# Patient Record
Sex: Female | Born: 1990 | Race: Black or African American | Hispanic: No | Smoking: Never smoker
Health system: Southern US, Community
[De-identification: ages and names within clinical notes are randomized; demographics above are authoritative.]

## PROBLEM LIST (undated history)

## (undated) ENCOUNTER — Inpatient Hospital Stay (HOSPITAL_COMMUNITY): Payer: Self-pay

## (undated) ENCOUNTER — Emergency Department (HOSPITAL_COMMUNITY): Admission: EM | Payer: Medicaid Other | Source: Home / Self Care

## (undated) DIAGNOSIS — N390 Urinary tract infection, site not specified: Secondary | ICD-10-CM

## (undated) DIAGNOSIS — O1490 Unspecified pre-eclampsia, unspecified trimester: Secondary | ICD-10-CM

## (undated) DIAGNOSIS — K439 Ventral hernia without obstruction or gangrene: Secondary | ICD-10-CM

## (undated) DIAGNOSIS — O24419 Gestational diabetes mellitus in pregnancy, unspecified control: Secondary | ICD-10-CM

## (undated) DIAGNOSIS — D563 Thalassemia minor: Secondary | ICD-10-CM

## (undated) HISTORY — PX: OTHER SURGICAL HISTORY: SHX169

## (undated) HISTORY — DX: Unspecified pre-eclampsia, unspecified trimester: O14.90

## (undated) HISTORY — DX: Thalassemia minor: D56.3

## (undated) HISTORY — DX: Ventral hernia without obstruction or gangrene: K43.9

## (undated) HISTORY — DX: Urinary tract infection, site not specified: N39.0

---
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
## 2009-07-01 ENCOUNTER — Ambulatory Visit (HOSPITAL_COMMUNITY): Admission: RE | Admit: 2009-07-01 | Discharge: 2009-07-01 | Payer: Self-pay | Admitting: Obstetrics & Gynecology

## 2009-08-02 ENCOUNTER — Ambulatory Visit (HOSPITAL_COMMUNITY): Admission: RE | Admit: 2009-08-02 | Discharge: 2009-08-02 | Payer: Self-pay | Admitting: Obstetrics & Gynecology

## 2009-08-12 ENCOUNTER — Ambulatory Visit (HOSPITAL_COMMUNITY): Admission: RE | Admit: 2009-08-12 | Discharge: 2009-08-12 | Payer: Self-pay | Admitting: Obstetrics & Gynecology

## 2010-01-09 ENCOUNTER — Inpatient Hospital Stay (HOSPITAL_COMMUNITY): Admission: AD | Admit: 2010-01-09 | Discharge: 2010-01-12 | Payer: Self-pay | Admitting: Obstetrics & Gynecology

## 2010-01-10 ENCOUNTER — Encounter: Payer: Self-pay | Admitting: Obstetrics

## 2010-06-22 LAB — URIC ACID
Uric Acid, Serum: 5.9 mg/dL (ref 2.4–7.0)
Uric Acid, Serum: 7.3 mg/dL — ABNORMAL HIGH (ref 2.4–7.0)

## 2010-06-22 LAB — COMPREHENSIVE METABOLIC PANEL
ALT: 20 U/L (ref 0–35)
AST: 29 U/L (ref 0–37)
AST: 39 U/L — ABNORMAL HIGH (ref 0–37)
Albumin: 1.8 g/dL — ABNORMAL LOW (ref 3.5–5.2)
Albumin: 2.1 g/dL — ABNORMAL LOW (ref 3.5–5.2)
BUN: 13 mg/dL (ref 6–23)
CO2: 21 mEq/L (ref 19–32)
Calcium: 8.4 mg/dL (ref 8.4–10.5)
Chloride: 102 mEq/L (ref 96–112)
Creatinine, Ser: 0.69 mg/dL (ref 0.4–1.2)
GFR calc non Af Amer: >60 mL/min (ref >60)
GFR calc non Af Amer: >60 mL/min (ref >60)
Glucose, Bld: 108 mg/dL — ABNORMAL HIGH (ref 70–99)
Glucose, Bld: 137 mg/dL — ABNORMAL HIGH (ref 70–99)
Potassium: 4 mEq/L (ref 3.5–5.1)
Total Bilirubin: 0.2 mg/dL — ABNORMAL LOW (ref 0.3–1.2)
Total Bilirubin: 0.5 mg/dL (ref 0.3–1.2)
Total Protein: 5.2 g/dL — ABNORMAL LOW (ref 6.0–8.3)

## 2010-06-22 LAB — URINALYSIS, ROUTINE W REFLEX MICROSCOPIC
Bilirubin Urine: NEGATIVE
Leukocytes, UA: NEGATIVE
Protein, ur: >300 mg/dL — AB
Specific Gravity, Urine: 1.025 (ref 1.005–1.030)
pH: 7 (ref 5.0–8.0)

## 2010-06-22 LAB — CBC
HCT: 39.2 % (ref 36.0–46.0)
HCT: 40 % (ref 36.0–46.0)
Hemoglobin: 11.5 g/dL — ABNORMAL LOW (ref 12.0–15.0)
Hemoglobin: 13.2 g/dL (ref 12.0–15.0)
MCH: 30.6 pg (ref 26.0–34.0)
MCHC: 33.9 g/dL (ref 30.0–36.0)
MCHC: 34.8 g/dL (ref 30.0–36.0)
RBC: 4.3 MIL/uL (ref 3.87–5.11)
RBC: 4.45 MIL/uL (ref 3.87–5.11)
RDW: 14.1 % (ref 11.5–15.5)
WBC: 12.8 10*3/uL — ABNORMAL HIGH (ref 4.0–10.5)

## 2010-06-22 LAB — LACTATE DEHYDROGENASE
LDH: 248 U/L (ref 94–250)
LDH: 347 U/L — ABNORMAL HIGH (ref 94–250)

## 2010-06-22 LAB — URINE MICROSCOPIC-ADD ON

## 2010-06-22 LAB — MRSA CULTURE

## 2010-06-22 LAB — RPR: RPR Ser Ql: NONREACTIVE

## 2010-06-30 ENCOUNTER — Emergency Department (HOSPITAL_COMMUNITY)
Admission: EM | Admit: 2010-06-30 | Discharge: 2010-06-30 | Disposition: A | Discharge status: Home / Self Care | Payer: Medicaid Other | Attending: Emergency Medicine | Admitting: Emergency Medicine

## 2010-06-30 DIAGNOSIS — R197 Diarrhea, unspecified: Secondary | ICD-10-CM | POA: Insufficient documentation

## 2010-06-30 DIAGNOSIS — R112 Nausea with vomiting, unspecified: Secondary | ICD-10-CM | POA: Insufficient documentation

## 2010-06-30 DIAGNOSIS — R51 Headache: Secondary | ICD-10-CM | POA: Insufficient documentation

## 2010-06-30 DIAGNOSIS — R42 Dizziness and giddiness: Secondary | ICD-10-CM | POA: Insufficient documentation

## 2010-06-30 LAB — URINALYSIS, ROUTINE W REFLEX MICROSCOPIC
Glucose, UA: NEGATIVE mg/dL
Ketones, ur: 15 mg/dL — AB
Nitrite: NEGATIVE
Protein, ur: 100 mg/dL — AB
Urobilinogen, UA: 0.2 mg/dL (ref 0.0–1.0)
pH: 6 (ref 5.0–8.0)

## 2010-06-30 LAB — POCT I-STAT, CHEM 8
Chloride: 105 mEq/L (ref 96–112)
Creatinine, Ser: 0.7 mg/dL (ref 0.4–1.2)
Glucose, Bld: 150 mg/dL — ABNORMAL HIGH (ref 70–99)
Hemoglobin: 13.9 g/dL (ref 12.0–15.0)
Potassium: 3.5 mEq/L (ref 3.5–5.1)
Sodium: 141 mEq/L (ref 135–145)

## 2010-06-30 LAB — URINE MICROSCOPIC-ADD ON

## 2010-06-30 LAB — POCT PREGNANCY, URINE: Preg Test, Ur: NEGATIVE

## 2010-08-27 ENCOUNTER — Emergency Department (HOSPITAL_COMMUNITY)
Admission: EM | Admit: 2010-08-27 | Discharge: 2010-08-27 | Disposition: A | Discharge status: Home / Self Care | Payer: Self-pay | Attending: Emergency Medicine | Admitting: Emergency Medicine

## 2010-08-27 DIAGNOSIS — R509 Fever, unspecified: Secondary | ICD-10-CM | POA: Insufficient documentation

## 2010-08-27 DIAGNOSIS — R51 Headache: Secondary | ICD-10-CM | POA: Insufficient documentation

## 2010-08-27 DIAGNOSIS — J029 Acute pharyngitis, unspecified: Secondary | ICD-10-CM | POA: Insufficient documentation

## 2010-08-27 LAB — RAPID STREP SCREEN (MED CTR MEBANE ONLY): Streptococcus, Group A Screen (Direct): NEGATIVE

## 2011-04-10 NOTE — L&D Delivery Note (Signed)
Delivery Note At 8:05 AM a viable female was delivered via Vaginal, Spontaneous Delivery (Presentation: Left Occiput Anterior).  APGAR: 9-9 , ; weight .   Placenta status: Intact, Spontaneous.  Cord: 3 vessels with the following complications: None.  Cord pH: none  Anesthesia: Epidural  Episiotomy: None Lacerations: None Suture Repair: none Est. Blood Loss (mL): 300  Mom to postpartum.  Baby to nursery-stable.  Geryl Dohn A 02/27/2012, 8:53 AM

## 2011-07-18 ENCOUNTER — Encounter: Payer: Medicaid Other | Admitting: Obstetrics and Gynecology

## 2011-07-18 DIAGNOSIS — Z01419 Encounter for gynecological examination (general) (routine) without abnormal findings: Secondary | ICD-10-CM

## 2011-07-25 ENCOUNTER — Inpatient Hospital Stay (HOSPITAL_COMMUNITY)
Admission: AD | Admit: 2011-07-25 | Discharge: 2011-07-25 | Disposition: A | Discharge status: Home / Self Care | Payer: Medicaid Other | Source: Ambulatory Visit | Attending: Family Medicine | Admitting: Family Medicine

## 2011-07-25 ENCOUNTER — Encounter (HOSPITAL_COMMUNITY): Payer: Self-pay | Admitting: *Deleted

## 2011-07-25 ENCOUNTER — Inpatient Hospital Stay (HOSPITAL_COMMUNITY): Payer: Medicaid Other

## 2011-07-25 DIAGNOSIS — R109 Unspecified abdominal pain: Secondary | ICD-10-CM | POA: Insufficient documentation

## 2011-07-25 DIAGNOSIS — N949 Unspecified condition associated with female genital organs and menstrual cycle: Secondary | ICD-10-CM | POA: Insufficient documentation

## 2011-07-25 DIAGNOSIS — O99891 Other specified diseases and conditions complicating pregnancy: Secondary | ICD-10-CM | POA: Insufficient documentation

## 2011-07-25 DIAGNOSIS — Z3490 Encounter for supervision of normal pregnancy, unspecified, unspecified trimester: Secondary | ICD-10-CM

## 2011-07-25 DIAGNOSIS — Z1389 Encounter for screening for other disorder: Secondary | ICD-10-CM

## 2011-07-25 DIAGNOSIS — O210 Mild hyperemesis gravidarum: Secondary | ICD-10-CM | POA: Insufficient documentation

## 2011-07-25 LAB — URINE MICROSCOPIC-ADD ON

## 2011-07-25 LAB — POCT PREGNANCY, URINE: Preg Test, Ur: POSITIVE — AB

## 2011-07-25 LAB — URINALYSIS, ROUTINE W REFLEX MICROSCOPIC
Glucose, UA: NEGATIVE mg/dL
Ketones, ur: NEGATIVE mg/dL
Leukocytes, UA: NEGATIVE
Nitrite: NEGATIVE
Specific Gravity, Urine: 1.015 (ref 1.005–1.030)
pH: 8 (ref 5.0–8.0)

## 2011-07-25 LAB — WET PREP, GENITAL
Trich, Wet Prep: NONE SEEN
Yeast Wet Prep HPF POC: NONE SEEN

## 2011-07-25 LAB — HCG, QUANTITATIVE, PREGNANCY: hCG, Beta Chain, Quant, S: 114338 m[IU]/mL — ABNORMAL HIGH (ref <5)

## 2011-07-25 MED ORDER — PROMETHAZINE HCL 25 MG PO TABS: 25.0000 mg | ORAL_TABLET | Freq: Four times a day (QID) | ORAL | Status: DC | PRN

## 2011-07-25 NOTE — MAU Note (Signed)
Neg HPT beginning of April, but pt feels like she is pregnant, having morning sickness.  Cramping since Saturday, denies vaginal bleeding.  States cramps are severe & sharp.  Vomitting since end of March.

## 2011-07-25 NOTE — MAU Provider Note (Signed)
History   Pt presents today c/o lower abd pain that has been severe for the past several days. She also c/o vag dc. She denies vag bleeding, fever, or any other sx at this time. She is concerned she may be pregnant.  CSN: [id number]  Arrival date and time: 07/25/11 1141   First Provider Initiated Contact with Patient 07/25/11 1218      Chief Complaint  Patient presents with  . Abdominal Pain   HPI  OB History    Grav Para Term Preterm Abortions TAB SAB Ect Mult Living   2 1 1       1       Past Medical History  Diagnosis Date  . Pregnancy induced hypertension     No past surgical history on file.  History reviewed. No pertinent family history.  History  Substance Use Topics  . Smoking status: Never Smoker   . Smokeless tobacco: Not on file  . Alcohol Use: No    Allergies: Allergies not on file  No prescriptions prior to admission    Review of Systems  Constitutional: Negative for fever and chills.  Eyes: Negative for blurred vision and double vision.  Respiratory: Negative for cough, hemoptysis, sputum production, shortness of breath and wheezing.   Cardiovascular: Negative for chest pain and palpitations.  Gastrointestinal: Positive for nausea and abdominal pain. Negative for vomiting, diarrhea and constipation.  Genitourinary: Negative for dysuria, urgency, frequency and hematuria.  Neurological: Negative for dizziness and headaches.  Psychiatric/Behavioral: Negative for depression and suicidal ideas.   Physical Exam   Blood pressure 113/72, pulse 110, temperature 98.7 F (37.1 C), temperature source Oral, resp. rate 16, height 5\' 4"  (1.626 m), weight 122 lb (55.339 kg), last menstrual period 06/03/2011.  Physical Exam  Nursing note and vitals reviewed. Constitutional: She is oriented to person, place, and time. She appears well-developed and well-nourished. No distress.  HENT:  Head: Normocephalic and atraumatic.  Eyes: EOM are normal. Pupils are  equal, round, and reactive to light.  GI: Soft. She exhibits no distension and no mass. There is no tenderness. There is no rebound and no guarding.  Genitourinary: No bleeding around the vagina. Vaginal discharge found.       Cervix Lg/closed. No adnexal masses.  Neurological: She is alert and oriented to person, place, and time.  Skin: Skin is warm and dry. She is not diaphoretic.  Psychiatric: She has a normal mood and affect. Her behavior is normal. Judgment and thought content normal.    MAU Course  Procedures  Wet prep and GC/Chlamydia cultures done.  Results for orders placed during the hospital encounter of 07/25/11 (from the past 24 hour(s))  URINALYSIS, ROUTINE W REFLEX MICROSCOPIC     Status: Abnormal   Collection Time   07/25/11 11:55 AM      Component Value Range   Color, Urine YELLOW  YELLOW    APPearance CLEAR  CLEAR    Specific Gravity, Urine 1.015  1.005 - 1.030    pH 8.0  5.0 - 8.0    Glucose, UA NEGATIVE  NEGATIVE (mg/dL)   Hgb urine dipstick NEGATIVE  NEGATIVE    Bilirubin Urine NEGATIVE  NEGATIVE    Ketones, ur NEGATIVE  NEGATIVE (mg/dL)   Protein, ur 30 (*) NEGATIVE (mg/dL)   Urobilinogen, UA 1.0  0.0 - 1.0 (mg/dL)   Nitrite NEGATIVE  NEGATIVE    Leukocytes, UA NEGATIVE  NEGATIVE   URINE MICROSCOPIC-ADD ON     Status: Abnormal  Collection Time   07/25/11 11:55 AM      Component Value Range   Squamous Epithelial / LPF FEW (*) RARE    WBC, UA 0-2  <3 (WBC/hpf)   Urine-Other MUCOUS PRESENT    POCT PREGNANCY, URINE     Status: Abnormal   Collection Time   07/25/11 12:00 PM      Component Value Range   Preg Test, Ur POSITIVE (*) NEGATIVE   WET PREP, GENITAL     Status: Abnormal   Collection Time   07/25/11 12:25 PM      Component Value Range   Yeast Wet Prep HPF POC NONE SEEN  NONE SEEN    Trich, Wet Prep NONE SEEN  NONE SEEN    Clue Cells Wet Prep HPF POC FEW (*) NONE SEEN    WBC, Wet Prep HPF POC FEW (*) NONE SEEN   HCG, QUANTITATIVE, PREGNANCY      Status: Abnormal   Collection Time   07/25/11 12:40 PM      Component Value Range   hCG, Beta Chain, Quant, S [id number] (*) <5 (mIU/mL)   US shows single IUP at 8.4wks with an EDC of 03/01/12.  Assessment and Plan  IUP: discussed with pt at length. Discussed diet, activity, risks, and precautions. Will give Rx for phenergan for prn nausea.  Clinton Gallant. Ramla Hase III, DrHSc, MPAS, PA-C  07/25/2011, 12:24 PM

## 2011-07-25 NOTE — MAU Provider Note (Signed)
Chart reviewed and agree with management and plan.  

## 2011-07-26 LAB — GC/CHLAMYDIA PROBE AMP, GENITAL: GC Probe Amp, Genital: NEGATIVE

## 2011-08-27 LAB — OB RESULTS CONSOLE RUBELLA ANTIBODY, IGM: Rubella: IMMUNE

## 2011-08-27 LAB — OB RESULTS CONSOLE HIV ANTIBODY (ROUTINE TESTING): HIV: NONREACTIVE

## 2011-08-27 LAB — OB RESULTS CONSOLE RPR: RPR: NONREACTIVE

## 2011-09-06 ENCOUNTER — Ambulatory Visit (INDEPENDENT_AMBULATORY_CARE_PROVIDER_SITE_OTHER): Payer: Medicaid Other | Admitting: General Surgery

## 2011-09-07 ENCOUNTER — Ambulatory Visit (INDEPENDENT_AMBULATORY_CARE_PROVIDER_SITE_OTHER): Payer: Medicaid Other | Admitting: General Surgery

## 2011-10-05 ENCOUNTER — Encounter (INDEPENDENT_AMBULATORY_CARE_PROVIDER_SITE_OTHER): Payer: Self-pay | Admitting: General Surgery

## 2011-10-08 ENCOUNTER — Ambulatory Visit (INDEPENDENT_AMBULATORY_CARE_PROVIDER_SITE_OTHER): Payer: Medicaid Other | Admitting: General Surgery

## 2011-10-09 ENCOUNTER — Telehealth (INDEPENDENT_AMBULATORY_CARE_PROVIDER_SITE_OTHER): Payer: Self-pay | Admitting: General Surgery

## 2011-10-09 NOTE — Telephone Encounter (Signed)
Called and advised the patient did not show up for the appointment with Dr. Derrell Lolling on 10/08/11. Advised that we also needed the progress notes for this patient. The only information received were the labs and that was not enough information for Dr. Derrell Lolling to work on in order to properly evaluate this patient.

## 2011-10-16 ENCOUNTER — Encounter (INDEPENDENT_AMBULATORY_CARE_PROVIDER_SITE_OTHER): Payer: Self-pay | Admitting: General Surgery

## 2012-02-01 LAB — OB RESULTS CONSOLE GBS: GBS: NEGATIVE

## 2012-02-21 ENCOUNTER — Encounter (HOSPITAL_COMMUNITY): Payer: Self-pay | Admitting: *Deleted

## 2012-02-21 ENCOUNTER — Telehealth (HOSPITAL_COMMUNITY): Payer: Self-pay | Admitting: *Deleted

## 2012-02-21 NOTE — Telephone Encounter (Signed)
Preadmission screen  

## 2012-02-26 ENCOUNTER — Inpatient Hospital Stay (HOSPITAL_COMMUNITY)
Admission: RE | Admit: 2012-02-26 | Discharge: 2012-02-29 | DRG: 775 | Disposition: A | Discharge status: Home / Self Care | Payer: Medicaid Other | Source: Ambulatory Visit | Attending: Obstetrics & Gynecology | Admitting: Obstetrics & Gynecology

## 2012-02-26 ENCOUNTER — Encounter (HOSPITAL_COMMUNITY): Payer: Self-pay

## 2012-02-26 DIAGNOSIS — O99814 Abnormal glucose complicating childbirth: Principal | ICD-10-CM | POA: Diagnosis present

## 2012-02-26 LAB — CBC
MCH: 27.4 pg (ref 26.0–34.0)
MCHC: 33.4 g/dL (ref 30.0–36.0)
MCV: 82 fL (ref 78.0–100.0)
Platelets: 185 10*3/uL (ref 150–400)
RDW: 15 % (ref 11.5–15.5)
WBC: 7.4 10*3/uL (ref 4.0–10.5)

## 2012-02-26 MED ORDER — NALBUPHINE SYRINGE 5 MG/0.5 ML
10.0000 mg | INJECTION | INTRAMUSCULAR | Status: DC | PRN
  Administered 2012-02-26: 10 mg via INTRAVENOUS
  Filled 2012-02-26 (×2): qty 1

## 2012-02-26 MED ORDER — IBUPROFEN 600 MG PO TABS
600.0000 mg | ORAL_TABLET | Freq: Four times a day (QID) | ORAL | Status: DC | PRN
  Filled 2012-02-26: qty 1

## 2012-02-26 MED ORDER — NALBUPHINE SYRINGE 5 MG/0.5 ML
10.0000 mg | INJECTION | Freq: Four times a day (QID) | INTRAMUSCULAR | Status: DC | PRN
  Administered 2012-02-26: 10 mg via INTRAMUSCULAR
  Filled 2012-02-26 (×3): qty 1

## 2012-02-26 MED ORDER — LIDOCAINE HCL (PF) 1 % IJ SOLN
30.0000 mL | INTRAMUSCULAR | Status: DC | PRN
  Filled 2012-02-26 (×2): qty 30

## 2012-02-26 MED ORDER — OXYCODONE-ACETAMINOPHEN 5-325 MG PO TABS: 1.0000 | ORAL_TABLET | ORAL | Status: DC | PRN

## 2012-02-26 MED ORDER — CITRIC ACID-SODIUM CITRATE 334-500 MG/5ML PO SOLN
30.0000 mL | ORAL | Status: DC | PRN
  Administered 2012-02-27: 30 mL via ORAL
  Filled 2012-02-26: qty 15

## 2012-02-26 MED ORDER — TERBUTALINE SULFATE 1 MG/ML IJ SOLN: 0.2500 mg | Freq: Once | INTRAMUSCULAR | Status: AC | PRN

## 2012-02-26 MED ORDER — MISOPROSTOL 25 MCG QUARTER TABLET
25.0000 ug | ORAL_TABLET | ORAL | Status: DC | PRN
  Administered 2012-02-26 (×2): 25 ug via VAGINAL
  Filled 2012-02-26: qty 1
  Filled 2012-02-26 (×2): qty 0.25

## 2012-02-26 MED ORDER — OXYTOCIN 40 UNITS IN LACTATED RINGERS INFUSION - SIMPLE MED
62.5000 mL/h | INTRAVENOUS | Status: DC
  Administered 2012-02-27: 62.5 mL/h via INTRAVENOUS
  Filled 2012-02-26: qty 1000

## 2012-02-26 MED ORDER — LACTATED RINGERS IV SOLN
INTRAVENOUS | Status: DC
  Administered 2012-02-26 – 2012-02-27 (×2): via INTRAVENOUS

## 2012-02-26 MED ORDER — ONDANSETRON HCL 4 MG/2ML IJ SOLN: 4.0000 mg | Freq: Four times a day (QID) | INTRAMUSCULAR | Status: DC | PRN

## 2012-02-26 MED ORDER — ACETAMINOPHEN 325 MG PO TABS: 650.0000 mg | ORAL_TABLET | ORAL | Status: DC | PRN

## 2012-02-26 MED ORDER — OXYTOCIN BOLUS FROM INFUSION: 500.0000 mL | INTRAVENOUS | Status: DC

## 2012-02-26 MED ORDER — FLEET ENEMA 7-19 GM/118ML RE ENEM: 1.0000 | ENEMA | RECTAL | Status: DC | PRN

## 2012-02-26 MED ORDER — NALBUPHINE SYRINGE 5 MG/0.5 ML: 10.0000 mg | INJECTION | Freq: Four times a day (QID) | INTRAMUSCULAR | Status: DC | PRN

## 2012-02-26 MED ORDER — LACTATED RINGERS IV SOLN
500.0000 mL | INTRAVENOUS | Status: DC | PRN
  Administered 2012-02-27: 300 mL via INTRAVENOUS

## 2012-02-26 MED ORDER — PROMETHAZINE HCL 25 MG/ML IJ SOLN
25.0000 mg | Freq: Four times a day (QID) | INTRAMUSCULAR | Status: DC | PRN
  Administered 2012-02-26: 25 mg via INTRAMUSCULAR
  Filled 2012-02-26: qty 1

## 2012-02-26 NOTE — H&P (Signed)
Traci [name] is a 21 y.o. female presenting for IOL. Maternal Medical History:  Reason for admission: 20 yo G2 P1.  EDC 11-25- 13.  Presents for IOL for Gestational Diabetes.  Fetal activity: Perceived fetal activity is normal.   Last perceived fetal movement was within the past hour.    Prenatal complications: Gestational Diabetes.  Prenatal Complications - Diabetes: gestational. Diabetes is managed by oral agent (monotherapy).      OB History    Grav Para Term Preterm Abortions TAB SAB Ect Mult Living   2 1 1       1      Past Medical History  Diagnosis Date  . Pregnancy induced hypertension   . Hernia    History reviewed. No pertinent past surgical history. Family History: family history is not on file. Social History:  reports that she has never smoked. She does not have any smokeless tobacco history on file. She reports that she does not drink alcohol or use illicit drugs.   Prenatal Transfer Tool  Maternal Diabetes: Yes Genetic Screening: Normal Maternal Ultrasounds/Referrals: Normal Fetal Ultrasounds or other Referrals:  None Maternal Substance Abuse:  No Significant Maternal Medications:  Meds include: Other:  See chart. Significant Maternal Lab Results:  Lab values include: Group B Strep negative Other Comments:  None  Review of Systems  All other systems reviewed and are negative.    Dilation: 1.5 Effacement (%): 60 Station: -2;-3 Exam by:: Traci [name] Blood pressure 122/76, pulse 76, temperature 97.5 F (36.4 C), temperature source Oral, resp. rate 20, height 5\' 4"  (1.626 m), weight 174 lb (78.926 kg), last menstrual period 06/03/2011. Maternal Exam:  Uterine Assessment: Contraction strength is mild.  Abdomen: Patient reports no abdominal tenderness. Fetal presentation: vertex  Introitus: Normal vulva. Normal vagina.  Pelvis: adequate for delivery.   Cervix: Cervix evaluated by digital exam.     Physical Exam  Nursing note and vitals  reviewed. Constitutional: She is oriented to person, place, and time. She appears well-developed and well-nourished.  HENT:  Head: Normocephalic and atraumatic.  Eyes: Conjunctivae normal are normal. Pupils are equal, round, and reactive to light.  Neck: Normal range of motion. Neck supple.  Cardiovascular: Normal rate and regular rhythm.   Respiratory: Effort normal.  GI: Soft.  Genitourinary: Vagina normal and uterus normal.  Musculoskeletal: Normal range of motion.  Neurological: She is alert and oriented to person, place, and time.  Skin: Skin is warm and dry.  Psychiatric: She has a normal mood and affect. Her behavior is normal. Judgment and thought content normal.    Prenatal labs: ABO, Rh: B/Positive/-- (05/20 0000) Antibody: Negative (05/20 0000) Rubella: Immune (05/20 0000) RPR: NON REACTIVE (11/19 0900)  HBsAg: Negative (05/20 0000)  HIV: Non-reactive (05/20 0000)  GBS: Negative (10/25 0000)   Assessment/Plan: 39 weeks.  Gestational Diabetes.  2 stage IOL.   Traci [name] A 02/26/2012, 2:56 PM

## 2012-02-27 ENCOUNTER — Encounter (HOSPITAL_COMMUNITY): Payer: Self-pay | Admitting: Anesthesiology

## 2012-02-27 ENCOUNTER — Inpatient Hospital Stay (HOSPITAL_COMMUNITY): Payer: Medicaid Other | Admitting: Anesthesiology

## 2012-02-27 ENCOUNTER — Encounter (HOSPITAL_COMMUNITY): Payer: Self-pay

## 2012-02-27 LAB — GLUCOSE, CAPILLARY
Glucose-Capillary: 127 mg/dL — ABNORMAL HIGH (ref 70–99)
Glucose-Capillary: 132 mg/dL — ABNORMAL HIGH (ref 70–99)

## 2012-02-27 MED ORDER — DIBUCAINE 1 % RE OINT: 1.0000 "application " | TOPICAL_OINTMENT | RECTAL | Status: DC | PRN

## 2012-02-27 MED ORDER — SENNOSIDES-DOCUSATE SODIUM 8.6-50 MG PO TABS
2.0000 | ORAL_TABLET | Freq: Every day | ORAL | Status: DC
  Administered 2012-02-28: 2 via ORAL

## 2012-02-27 MED ORDER — IBUPROFEN 600 MG PO TABS
600.0000 mg | ORAL_TABLET | Freq: Four times a day (QID) | ORAL | Status: DC
  Administered 2012-02-27 – 2012-02-29 (×9): 600 mg via ORAL
  Filled 2012-02-27 (×8): qty 1

## 2012-02-27 MED ORDER — EPHEDRINE 5 MG/ML INJ: 10.0000 mg | INTRAVENOUS | Status: DC | PRN

## 2012-02-27 MED ORDER — BENZOCAINE-MENTHOL 20-0.5 % EX AERO: 1.0000 "application " | INHALATION_SPRAY | CUTANEOUS | Status: DC | PRN

## 2012-02-27 MED ORDER — LACTATED RINGERS IV SOLN
500.0000 mL | Freq: Once | INTRAVENOUS | Status: AC
  Administered 2012-02-27: 500 mL via INTRAVENOUS

## 2012-02-27 MED ORDER — LIDOCAINE HCL (PF) 1 % IJ SOLN
INTRAMUSCULAR | Status: DC | PRN
  Administered 2012-02-27 (×4): 4 mL

## 2012-02-27 MED ORDER — TETANUS-DIPHTH-ACELL PERTUSSIS 5-2.5-18.5 LF-MCG/0.5 IM SUSP: 0.5000 mL | Freq: Once | INTRAMUSCULAR | Status: DC

## 2012-02-27 MED ORDER — ONDANSETRON HCL 4 MG/2ML IJ SOLN: 4.0000 mg | INTRAMUSCULAR | Status: DC | PRN

## 2012-02-27 MED ORDER — WITCH HAZEL-GLYCERIN EX PADS: 1.0000 "application " | MEDICATED_PAD | CUTANEOUS | Status: DC | PRN

## 2012-02-27 MED ORDER — OXYTOCIN 40 UNITS IN LACTATED RINGERS INFUSION - SIMPLE MED: 62.5000 mL/h | INTRAVENOUS | Status: DC | PRN

## 2012-02-27 MED ORDER — ONDANSETRON HCL 4 MG PO TABS
4.0000 mg | ORAL_TABLET | ORAL | Status: DC | PRN
Start: 2012-02-27 — End: 2012-02-29

## 2012-02-27 MED ORDER — SIMETHICONE 80 MG PO CHEW: 80.0000 mg | CHEWABLE_TABLET | ORAL | Status: DC | PRN

## 2012-02-27 MED ORDER — OXYCODONE-ACETAMINOPHEN 5-325 MG PO TABS
1.0000 | ORAL_TABLET | ORAL | Status: DC | PRN
  Administered 2012-02-27 – 2012-02-29 (×5): 1 via ORAL
  Filled 2012-02-27 (×5): qty 1

## 2012-02-27 MED ORDER — EPHEDRINE 5 MG/ML INJ
10.0000 mg | INTRAVENOUS | Status: DC | PRN
  Filled 2012-02-27: qty 4

## 2012-02-27 MED ORDER — FENTANYL 2.5 MCG/ML BUPIVACAINE 1/10 % EPIDURAL INFUSION (WH - ANES)
14.0000 mL/h | INTRAMUSCULAR | Status: DC
  Administered 2012-02-27: 14 mL/h via EPIDURAL
  Filled 2012-02-27: qty 125

## 2012-02-27 MED ORDER — LANOLIN HYDROUS EX OINT: TOPICAL_OINTMENT | CUTANEOUS | Status: DC | PRN

## 2012-02-27 MED ORDER — DIPHENHYDRAMINE HCL 50 MG/ML IJ SOLN: 12.5000 mg | INTRAMUSCULAR | Status: DC | PRN

## 2012-02-27 MED ORDER — PHENYLEPHRINE 40 MCG/ML (10ML) SYRINGE FOR IV PUSH (FOR BLOOD PRESSURE SUPPORT): 80.0000 ug | PREFILLED_SYRINGE | INTRAVENOUS | Status: DC | PRN

## 2012-02-27 MED ORDER — PRENATAL MULTIVITAMIN CH
1.0000 | ORAL_TABLET | Freq: Every day | ORAL | Status: DC
  Administered 2012-02-28 – 2012-02-29 (×2): 1 via ORAL
  Filled 2012-02-27 (×2): qty 1

## 2012-02-27 MED ORDER — ZOLPIDEM TARTRATE 5 MG PO TABS: 5.0000 mg | ORAL_TABLET | Freq: Every evening | ORAL | Status: DC | PRN

## 2012-02-27 MED ORDER — DIPHENHYDRAMINE HCL 25 MG PO CAPS: 25.0000 mg | ORAL_CAPSULE | Freq: Four times a day (QID) | ORAL | Status: DC | PRN

## 2012-02-27 MED ORDER — PHENYLEPHRINE 40 MCG/ML (10ML) SYRINGE FOR IV PUSH (FOR BLOOD PRESSURE SUPPORT)
80.0000 ug | PREFILLED_SYRINGE | INTRAVENOUS | Status: DC | PRN
  Filled 2012-02-27: qty 5

## 2012-02-27 NOTE — Progress Notes (Signed)
Lactation consultant here to assist pt with breastfeeding.

## 2012-02-27 NOTE — Progress Notes (Signed)
Pt in bed, holding infant.  Call light in reach

## 2012-02-27 NOTE — Anesthesia Procedure Notes (Signed)
Epidural Patient location during procedure: OB Start time: 02/27/2012 12:37 AM  Staffing Performed by: anesthesiologist   Preanesthetic Checklist Completed: patient identified, site marked, surgical consent, pre-op evaluation, timeout performed, IV checked, risks and benefits discussed and monitors and equipment checked  Epidural Patient position: sitting Prep: site prepped and draped and DuraPrep Patient monitoring: continuous pulse ox and blood pressure Approach: midline Injection technique: LOR air  Needle:  Needle type: Tuohy  Needle gauge: 17 G Needle length: 9 cm and 9 Needle insertion depth: 5 cm cm Catheter type: closed end flexible Catheter size: 19 Gauge Catheter at skin depth: 10 cm Test dose: negative  Assessment Events: blood not aspirated, injection not painful, no injection resistance, negative IV test and no paresthesia  Additional Notes Discussed risk of headache, infection, bleeding, nerve injury and failed or incomplete block.  Patient voices understanding and wishes to proceed. Reason for block:procedure for pain

## 2012-02-27 NOTE — Progress Notes (Signed)
Notified LC

## 2012-02-27 NOTE — Progress Notes (Signed)
nsy here to assist pt with breastfeeding

## 2012-02-27 NOTE — Progress Notes (Signed)
Traci [name] is [name] 21 y.o. G2P1001 at [redacted]w[redacted]d by LMP admitted for induction of labor due to Diabetes.  Subjective:   Objective: BP 89/42  Pulse 114  Temp 99.8 F (37.7 C) (Axillary)  Resp 18  Ht 5\' 4"  (1.626 m)  Wt 174 lb (78.926 kg)  BMI 29.87 kg/m2  LMP 06/03/2011   Total I/O In: -  Out: 800 [Urine:800]  FHT:  FHR: 150 bpm, variability: minimal ,  accelerations:  Present,  decelerations:  Present variable UC:   regular, every 2-3 minutes SVE:   Dilation: 7 Effacement (%): 80 Station: 0 Exam by:: L.Mears,Rn  Labs: Lab Results  Component Value Date   WBC 7.4 02/26/2012   HGB 12.0 02/26/2012   HCT 35.9* 02/26/2012   MCV 82.0 02/26/2012   PLT 185 02/26/2012    Assessment / Plan: Slow progress.  Continue pitocin.  Labor: Slow active phase. Preeclampsia:  n/[name] Fetal Wellbeing:  Category II Pain Control:  Epidural I/D:  n/[name]   Traci [name] 02/27/2012, 5:44 AM

## 2012-02-27 NOTE — Anesthesia Preprocedure Evaluation (Signed)
Anesthesia Evaluation  Patient identified by MRN, date of birth, ID band Patient awake    Reviewed: Allergy & Precautions, H&P , NPO status , Patient's Chart, lab work & pertinent test results, reviewed documented beta blocker date and time   History of Anesthesia Complications Negative for: history of anesthetic complications  Airway Mallampati: II TM Distance: >3 FB Neck ROM: full    Dental  (+) Teeth Intact   Pulmonary neg pulmonary ROS,  breath sounds clear to auscultation        Cardiovascular Rhythm:regular Rate:Normal     Neuro/Psych negative neurological ROS  negative psych ROS   GI/Hepatic negative GI ROS, Neg liver ROS,   Endo/Other  diabetes, Gestational, Oral Hypoglycemic Agents  Renal/GU negative Renal ROS  negative genitourinary   Musculoskeletal   Abdominal   Peds  Hematology negative hematology ROS (+)   Anesthesia Other Findings   Reproductive/Obstetrics (+) Pregnancy                           Anesthesia Physical Anesthesia Plan  ASA: II  Anesthesia Plan: Epidural   Post-op Pain Management:    Induction:   Airway Management Planned:   Additional Equipment:   Intra-op Plan:   Post-operative Plan:   Informed Consent: I have reviewed the patients History and Physical, chart, labs and discussed the procedure including the risks, benefits and alternatives for the proposed anesthesia with the patient or authorized representative who has indicated his/her understanding and acceptance.     Plan Discussed with:   Anesthesia Plan Comments:         Anesthesia Quick Evaluation

## 2012-02-27 NOTE — Progress Notes (Signed)
Traci [name] is a 21 y.o. G2P1001 at [redacted]w[redacted]d by LMP admitted for induction of labor due to Diabetes.  Subjective:   Objective: BP 97/47  Pulse 86  Temp 99.1 F (37.3 C) (Axillary)  Resp 16  Ht 5\' 4"  (1.626 m)  Wt 174 lb (78.926 kg)  BMI 29.87 kg/m2  LMP 06/03/2011   Total I/O In: -  Out: 800 [Urine:800]  FHT:  FHR: 150-160 bpm, variability: minimal ,  accelerations:  Present,  decelerations:  Present variable UC:   regular, every 2-3 minutes SVE:   Dilation: 6.5 Effacement (%): 80;90 Station: -1 Exam by:: L.Mears,RN  Labs: Lab Results  Component Value Date   WBC 7.4 02/26/2012   HGB 12.0 02/26/2012   HCT 35.9* 02/26/2012   MCV 82.0 02/26/2012   PLT 185 02/26/2012    Assessment / Plan: Induction of labor due to gestational diabetes,  progressing well on pitocin  Labor: Progressing normally Preeclampsia:  n/a Fetal Wellbeing:  Category II Pain Control:  Epidural I/D:  n/a Anticipated MOD:  NSVD  Traci [name] A 02/27/2012, 2:50 AM

## 2012-02-27 NOTE — Progress Notes (Signed)
MCKENNA GAMM is a 21 y.o. G2P1001 at [redacted]w[redacted]d by LMP admitted for induction of labor due to Diabetes.  Subjective:   Objective: BP 136/95  Pulse 130  Temp 98.5 F (36.9 C) (Oral)  Resp 20  Ht 5\' 4"  (1.626 m)  Wt 174 lb (78.926 kg)  BMI 29.87 kg/m2  LMP 06/03/2011 I/O last 3 completed shifts: In: -  Out: 800 [Urine:800] Total I/O In: -  Out: 300 [Urine:300]  FHT:  FHR: 150 bpm, variability: minimal ,  accelerations:  Present,  decelerations:  Present variable UC:   regular, every 2 minutes SVE:   Dilation: 10 Effacement (%): 100 Station: +2 Exam by:: S Grindstaff Rn  Labs: Lab Results  Component Value Date   WBC 7.4 02/26/2012   HGB 12.0 02/26/2012   HCT 35.9* 02/26/2012   MCV 82.0 02/26/2012   PLT 185 02/26/2012    Assessment / Plan: Induction of labor due to diabetes,  progressing well on pitocin  Labor: Progressing normally Preeclampsia:  labs stable Fetal Wellbeing:  Category II Pain Control:  Epidural I/D:  n/a Anticipated MOD:  NSVD  Demonica Farrey A 02/27/2012, 8:02 AM

## 2012-02-28 LAB — CBC
HCT: 32.1 % — ABNORMAL LOW (ref 36.0–46.0)
Hemoglobin: 10.5 g/dL — ABNORMAL LOW (ref 12.0–15.0)
MCV: 82.9 fL (ref 78.0–100.0)
RBC: 3.87 MIL/uL (ref 3.87–5.11)
RDW: 15.4 % (ref 11.5–15.5)
WBC: 10.7 10*3/uL — ABNORMAL HIGH (ref 4.0–10.5)

## 2012-02-28 NOTE — Anesthesia Postprocedure Evaluation (Signed)
  Anesthesia Post-op Note  Patient: Traci [name]  Procedure(s) Performed: * No procedures listed *  Patient Location: PACU and Mother/Baby  Anesthesia Type:Epidural  Level of Consciousness: awake, alert , oriented and patient cooperative  Airway and Oxygen Therapy: Patient Spontanous Breathing  Post-op Pain: none  Post-op Assessment: Post-op Vital signs reviewed and Patient's Cardiovascular Status Stable  Post-op Vital Signs: Reviewed and stable  Complications: No apparent anesthesia complications

## 2012-02-28 NOTE — Progress Notes (Signed)
UR chart review completed.  

## 2012-02-28 NOTE — Progress Notes (Signed)
Post Partum Day 1 Subjective: no complaints  Objective: Blood pressure 118/76, pulse 91, temperature 98 F (36.7 C), temperature source Oral, resp. rate 18, height 5\' 4"  (1.626 m), weight 174 lb (78.926 kg), last menstrual period 06/03/2011, SpO2 98.00%, unknown if currently breastfeeding.  Physical Exam:  General: alert and no distress Lochia: appropriate Uterine Fundus: firm Incision: none DVT Evaluation: No evidence of DVT seen on physical exam.   Basename 02/28/12 0530 02/26/12 0900  HGB 10.5* 12.0  HCT 32.1* 35.9*    Assessment/Plan: Plan for discharge tomorrow   LOS: 2 days   HARPER,CHARLES A 02/28/2012, 8:46 AM

## 2012-02-29 MED ORDER — MEDROXYPROGESTERONE ACETATE 150 MG/ML IM SUSP: 150.0000 mg | INTRAMUSCULAR | Status: DC

## 2012-02-29 MED ORDER — OXYCODONE-ACETAMINOPHEN 5-325 MG PO TABS: 1.0000 | ORAL_TABLET | Freq: Four times a day (QID) | ORAL | Status: DC | PRN

## 2012-02-29 NOTE — Discharge Summary (Signed)
  Obstetric Discharge Summary Reason for Admission: onset of labor Prenatal Procedures: none Intrapartum Procedures: spontaneous vaginal delivery Postpartum Procedures: none Complications-Operative and Postpartum: none  Hemoglobin  Date Value Range Status  02/28/2012 10.5* 12.0 - 15.0 g/dL Final     HCT  Date Value Range Status  02/28/2012 32.1* 36.0 - 46.0 % Final    Physical Exam:  General: alert Lochia: appropriate Uterine: firm Incision: n/a DVT Evaluation: No evidence of DVT seen on physical exam.  Discharge Diagnoses: Active Problems:  Normal delivery   Discharge Information: Date: 02/29/2012 Activity: pelvic rest Diet: routine Medications:  Prior to Admission medications   Medication Sig Start Date End Date Taking? Authorizing Provider  cholecalciferol (VITAMIN D) 400 UNITS TABS Take 800 Units by mouth daily.   Yes Historical Provider, MD  Prenatal Vit-Fe Fumarate-FA (PRENATAL MULTIVITAMIN) TABS Take 1 tablet by mouth daily.   Yes Historical Provider, MD  medroxyPROGESTERone (DEPO-PROVERA) 150 MG/ML injection Inject 1 mL (150 mg total) into the muscle every 3 (three) months. 02/29/12   Antionette Char, MD  oxyCODONE-acetaminophen (PERCOCET/ROXICET) 5-325 MG per tablet Take 1-2 tablets by mouth every 6 (six) hours as needed (moderate - severe pain). 02/29/12   Antionette Char, MD    Condition: stable Instructions: refer to routine discharge instructions Discharge to: home Follow-up Information    Follow up with Antionette Char A, MD. Schedule an appointment as soon as possible for a visit in 6 weeks.   Contact information:   [address] [place] [address] [phone]          Newborn Data: Live born  Information for the patient's newborn:  Tammela, Bales [[id number]]  female ; APGAR (1 MIN): 9   APGAR (5 MINS): 9    Home with mother.  JACKSON-MOORE,Tommaso Cavitt A 02/29/2012, 8:35 AM

## 2012-04-22 ENCOUNTER — Ambulatory Visit (INDEPENDENT_AMBULATORY_CARE_PROVIDER_SITE_OTHER): Payer: Medicaid Other | Admitting: Surgery

## 2012-04-23 ENCOUNTER — Encounter (INDEPENDENT_AMBULATORY_CARE_PROVIDER_SITE_OTHER): Payer: Self-pay | Admitting: Surgery

## 2013-02-12 ENCOUNTER — Other Ambulatory Visit: Payer: Self-pay

## 2013-07-22 ENCOUNTER — Encounter (HOSPITAL_COMMUNITY): Payer: Self-pay | Admitting: Emergency Medicine

## 2013-07-22 ENCOUNTER — Emergency Department (HOSPITAL_COMMUNITY)
Admission: EM | Admit: 2013-07-22 | Discharge: 2013-07-22 | Disposition: A | Discharge status: Home / Self Care | Payer: Medicaid Other | Attending: Emergency Medicine | Admitting: Emergency Medicine

## 2013-07-22 DIAGNOSIS — B349 Viral infection, unspecified: Secondary | ICD-10-CM

## 2013-07-22 DIAGNOSIS — R630 Anorexia: Secondary | ICD-10-CM | POA: Insufficient documentation

## 2013-07-22 DIAGNOSIS — J3489 Other specified disorders of nose and nasal sinuses: Secondary | ICD-10-CM | POA: Insufficient documentation

## 2013-07-22 DIAGNOSIS — J029 Acute pharyngitis, unspecified: Secondary | ICD-10-CM

## 2013-07-22 DIAGNOSIS — R52 Pain, unspecified: Secondary | ICD-10-CM | POA: Insufficient documentation

## 2013-07-22 DIAGNOSIS — Z8742 Personal history of other diseases of the female genital tract: Secondary | ICD-10-CM | POA: Insufficient documentation

## 2013-07-22 DIAGNOSIS — B9789 Other viral agents as the cause of diseases classified elsewhere: Secondary | ICD-10-CM | POA: Insufficient documentation

## 2013-07-22 DIAGNOSIS — K469 Unspecified abdominal hernia without obstruction or gangrene: Secondary | ICD-10-CM | POA: Insufficient documentation

## 2013-07-22 LAB — RAPID STREP SCREEN (MED CTR MEBANE ONLY): STREPTOCOCCUS, GROUP A SCREEN (DIRECT): NEGATIVE

## 2013-07-22 MED ORDER — ACETAMINOPHEN 325 MG PO TABS: 325.0000 mg | ORAL_TABLET | Freq: Four times a day (QID) | ORAL | Status: DC | PRN

## 2013-07-22 MED ORDER — ACETAMINOPHEN 325 MG PO TABS
325.0000 mg | ORAL_TABLET | Freq: Once | ORAL | Status: AC
  Administered 2013-07-22: 325 mg via ORAL
  Filled 2013-07-22: qty 1

## 2013-07-22 NOTE — ED Notes (Signed)
Pt states she began having sore throat and body aches yesterday.  Pt alert and oriented and talking on complete sentences.

## 2013-07-22 NOTE — ED Provider Notes (Signed)
CSN: [id number]     Arrival date & time 07/22/13  1214 History  This chart was scribed for Raymon MuttonMarissa Raiquan Chandler, PA, working with Benny LennertJoseph L Zammit, MD, by Ardelia Memsylan Malpass ED Scribe. This patient was seen in room TR06C/TR06C and the patient's care was started at 1:08 PM.   Chief Complaint  Patient presents with  . Sore Throat  . Generalized Body Aches    The history is provided by the patient. No language interpreter was used.    HPI Comments: Traci [name] is a 23 y.o. female who presents to the Emergency Department complaining of a constant, moderate sore throat onset early yesterday morning. She states that her sore throat pain is worsened with swallowing. She states that she has been eating and drinking less, due to worsening of pain with swallowing as well as reduced appetite.She reports associated generalized myalgias and nasal congestion onset yesterday, as well as subjective fever and chills. ED temperature is 99.4 F. She states that she has had sick contacts with her son who is sick with an ear infection. She denies cough, chest pain, dyspnea, nausea, emesis, diarrhea, abdominal pain or any other symptoms. She denies having any medication allergies.    Past Medical History  Diagnosis Date  . Pregnancy induced hypertension   . Hernia    History reviewed. No pertinent past surgical history. No family history on file. History  Substance Use Topics  . Smoking status: Never Smoker   . Smokeless tobacco: Not on file  . Alcohol Use: No   OB History   Grav Para Term Preterm Abortions TAB SAB Ect Mult Living   2 2 2       2      Review of Systems  Constitutional: Positive for fever (subjective) and chills.  HENT: Positive for congestion and sore throat. Negative for trouble swallowing.   Respiratory: Negative for cough and shortness of breath.   Cardiovascular: Negative for chest pain.  Gastrointestinal: Negative for nausea, vomiting, abdominal pain and diarrhea.  Musculoskeletal:  Positive for myalgias (generalized).  All other systems reviewed and are negative.   Allergies  Review of patient's allergies indicates no known allergies.  Home Medications   Prior to Admission medications   Medication Sig Start Date End Date Taking? Authorizing Provider  Prenatal Vit-Fe Fumarate-FA (PRENATAL MULTIVITAMIN) TABS Take 1 tablet by mouth daily.   Yes Historical Provider, MD   Triage Vitals: BP 140/86  Pulse 99  Temp(Src) 99.4 F (37.4 C) (Oral)  Ht 5\' 3"  (1.6 m)  Wt 134 lb (60.782 kg)  BMI 23.74 kg/m2  SpO2 94%  Physical Exam  Nursing note and vitals reviewed. Constitutional: She is oriented to person, place, and time. She appears well-developed and well-nourished. No distress.  HENT:  Head: Normocephalic and atraumatic.  Mouth/Throat: No oropharyngeal exudate.  Mild erythema identified to the posterior oropharynx and bilateral tonsils. Minimal bilateral tonsillar adenopathy identified. Negative exudate noted. Negative petechiae identified to the soft palate. Negative exudate and posterior oropharynx. Uvula midline with symmetrical elevation-negative deviation. Negative trismus.  Eyes: Conjunctivae and EOM are normal. Pupils are equal, round, and reactive to light. Right eye exhibits no discharge. Left eye exhibits no discharge.  Neck: Normal range of motion. Neck supple. No tracheal deviation present.  Negative neck stiffness Negative nuchal rigidity Negative cervical lymphadenopathy  Negative meningeal signs  Cardiovascular: Normal rate, regular rhythm and normal heart sounds.  Exam reveals no friction rub.   No murmur heard. Pulses:      Radial  pulses are 2+ on the right side, and 2+ on the left side.  Cap refill less than 3 seconds  Pulmonary/Chest: Effort normal and breath sounds normal. No respiratory distress. She has no wheezes. She has no rales.  Negative stridor Negative use of accessory muscles No sign of respiratory distress Vision is able to  speak in full sentences without difficulty  Musculoskeletal: Normal range of motion. She exhibits no tenderness.  Full ROM to upper and lower extremities without difficulty noted, negative ataxia noted.  Lymphadenopathy:    She has no cervical adenopathy.  Neurological: She is alert and oriented to person, place, and time. No cranial nerve deficit. She exhibits normal muscle tone. Coordination normal.  Skin: Skin is warm and dry. No rash noted. She is not diaphoretic. No erythema.  Psychiatric: She has a normal mood and affect. Her behavior is normal. Thought content normal.    ED Course  Procedures (including critical care time)  DIAGNOSTIC STUDIES: Oxygen Saturation is 94% on RA, adequate by my interpretation.    COORDINATION OF CARE: 1:15 PM- Discussed plan to obtain a Rapid Strep screen. Pt advised of plan for treatment and pt agrees.  Labs Review Labs Reviewed  RAPID STREP SCREEN  CULTURE, GROUP A STREP    Imaging Review No results found.   EKG Interpretation None      MDM   Final diagnoses:  Viral syndrome  Sore throat   Medications  acetaminophen (TYLENOL) tablet 325 mg (325 mg Oral Given 07/22/13 1325)   Filed Vitals:   07/22/13 1219 07/22/13 1406  BP: 140/86 114/73  Pulse: 99 72  Temp: 99.4 F (37.4 C) 99.1 F (37.3 C)  TempSrc: Oral Oral  Resp:  18  Height: 5\' 3"  (1.6 m)   Weight: 134 lb (60.782 kg)   SpO2: 94%     I personally performed the services described in this documentation, which was scribed in my presence. The recorded information has been reviewed and is accurate.  Patient presenting to the ED with generalized bodyaches, sore throat, chills and nasal congestion that started Monday evening. Stated that she's been having decreased appetite. Stated that she has not been drinking much water because she does not feel like she needs to. Reported that she's used nothing for discomfort. Stated that the sore throat is worse with swallowing food and  fluids. Reported that her son has an ear infection. Denied cough, chest pain, shortness of breath, difficulty breathing, nausea, vomiting, abdominal pain, visual distortions. Alert and oriented. GCS 15. Heart rate and rhythm normal. Lungs clear to auscultation to upper and lower lobes bilaterally. Radial pulses 2+. Negative stridor. Negative use of accessory muscles. Patient did speak in full sentences that difficulty. Negative neck stiffness, negative nuchal rigidity. Negative cervical lymphadenopathy identified. Negative and chills signs. Uvula midline with symmetrical elevation. Mild erythema identified to the posterior oropharynx and bilateral tonsils are negative exudate, petechiae. Negative trismus noted. Full rage of motion to upper lower tremors bilaterally without difficulty noted. Strep test negative. Doubt streptococcal pharyngitis. Doubt retropharyngeal abscess. Doubt peritonsillar abscess. Suspicion to be viral syndrome. Patient stable, afebrile. Tylenol minutes in ED setting. Discharged patient. Discussed with patient to rest and stay hydrated. Referred patient to primary care provider. Discussed with patient to closely monitor symptoms and if symptoms are to worsen or change to report back to the ED - strict return instructions given.  Patient agreed to plan of care, understood, all questions answered.    Raymon MuttonMarissa Phuc Kluttz, PA-C 07/22/13 1932

## 2013-07-22 NOTE — Discharge Instructions (Signed)
Please call your doctor for a followup appointment within 24-48 hours. When you talk to your doctor please let them know that you were seen in the emergency department and have them acquire all of your records so that they can discuss the findings with you and formulate a treatment plan to fully care for your new and ongoing problems. Please call and set-up an appointment with your primary care provider to be re-evaluated within the next 24-48 hours Please rest  Please drink plenty of fluids - it is important to stay hydrated when you have a virus Please take no more than 4,000 mg/ 4 grams of Tylenol per day for this can lead to liver issues and overdose which can be fatal Please continue to monitor symptoms closely and if symptoms are to worsen or change (fever greater than 101, chills, sweating, nausea, vomiting, diarrhea, chest pain, shortness of breath, difficulty breathing, numbness, tingling, weakness, worsening or changes to the throat pain, difficulty swallowing, dizziness, fainting, blood in stools, black tarry stools) please report back to the ED immediately  Viral Infections A virus is a type of germ. Viruses can cause:  Minor sore throats.  Aches and pains.  Headaches.  Runny nose.  Rashes.  Watery eyes.  Tiredness.  Coughs.  Loss of appetite.  Feeling sick to your stomach (nausea).  Throwing up (vomiting).  Watery poop (diarrhea). HOME CARE   Only take medicines as told by your doctor.  Drink enough water and fluids to keep your pee (urine) clear or pale yellow. Sports drinks are a good choice.  Get plenty of rest and eat healthy. Soups and broths with crackers or rice are fine. GET HELP RIGHT AWAY IF:   You have a very bad headache.  You have shortness of breath.  You have chest pain or neck pain.  You have an unusual rash.  You cannot stop throwing up.  You have watery poop that does not stop.  You cannot keep fluids down.  You or your child has  a temperature by mouth above 102 F (38.9 C), not controlled by medicine.  Your baby is older than 3 months with a rectal temperature of 102 F (38.9 C) or higher.  Your baby is 61 months old or younger with a rectal temperature of 100.4 F (38 C) or higher. MAKE SURE YOU:   Understand these instructions.  Will watch this condition.  Will get help right away if you are not doing well or get worse. Document Released: 03/08/2008 Document Revised: 06/18/2011 Document Reviewed: 08/01/2010 [facility] Patient Information 2014 [place], [place].   Emergency Department Resource Guide 1) Find a Doctor and Pay Out of Pocket Although you won't have to find out who is covered by your insurance plan, it is a good idea to ask around and get recommendations. You will then need to call the office and see if the doctor you have chosen will accept you as a new patient and what types of options they offer for patients who are self-pay. Some doctors offer discounts or will set up payment plans for their patients who do not have insurance, but you will need to ask so you aren't surprised when you get to your appointment.  2) Contact Your Local Health Department Not all health departments have doctors that can see patients for sick visits, but many do, so it is worth a call to see if yours does. If you don't know where your local health department is, you can check in your  phone book. The CDC also has a tool to help you locate your state's health department, and many state websites also have listings of all of their local health departments.  3) Find a Walk-in Clinic If your illness is not likely to be very severe or complicated, you may want to try a walk in clinic. These are popping up all over the country in pharmacies, drugstores, and shopping centers. They're usually staffed by nurse practitioners or physician assistants that have been trained to treat common illnesses and complaints. They're usually fairly  quick and inexpensive. However, if you have serious medical issues or chronic medical problems, these are probably not your best option.  No Primary Care Doctor: - Call Health Connect at  [phone] - they can help you locate a primary care doctor that  accepts your insurance, provides certain services, etc. - Physician Referral Service- [phone]  Chronic Pain Problems: Organization         Address  Phone   Notes  [facility]  [phone] Patients need to be referred by their primary care doctor.   Medication Assistance: Organization         Address  Phone   Notes  [facility] Psssistance Program [address] [place], [place] [address] [phone] --Must be a resident of [facility] [place] -- Must have NO insurance coverage whatsoever (no Medicaid/ Medicare, etc.) -- The pt. MUST have a primary care doctor that directs their care regularly and follows them in the community   Med[facility]ist  [phone]   [employer]  [phone]    Agencies that provide inexpensive medical care: Organization         Address  Phone   Notes  [facility]  [phone]   [facility]    [phone]   [facility] [address] [place], [place] [address] [phone]   [facility] [address], [place] [phone]) [phone]   Planned Parenthood    [phone]   [facility]    [phone]   [facility]  [address], [place] Phone:  [phone]) [phone], Fax:  [phone] Hours of Operation:  9 am - 6 pm, M-F.  Also accepts Medicaid/Medicare and self-pay.  [facility]  [address], [place] Phone: [phone], Fax: [phone]. Hours of Operation:  8:30 am - 5:30 pm, M-F.  Also accepts Medicaid and self-pay.  [facility] [place] [address], [place] Phone: [phone]) [phone]    [facility] [address], [place], [place] [phone], Ext. [phone] Mondays & Thursdays: 7-9 AM.  First 15 patients are seen on a first come, first serve basis.    Medicaid-accepting [facility] Providers:  Organization         Address  Phone   Notes  [facility] [address], [place] [phone] Also accepts self-pay patients.  [facility] [address], [place]  [phone]) [phone]   [facility] [address], [place] [phone]) [phone]   [facility] [address] [place] [address], [place] [phone]   Renaye RakersVeita Bland [address], [place]   [phone] Only accepts [place] Access [place] patients after they have their name applied to their card.  Self-Pay (no insurance) in [facility]:  Organization         Address  Phone   Notes  Sickle Cell Patients, [facility] [address] [place] [address] [place], [place] [phone]   [facility] [address] [place], [place] [phone]   [facility] [place]  [address], [place] [phone]   [facility]/Dr. Osei-Bonsu  [address], [place] or [address], [place] [phone] Phone number for both [place] and [place] locations is the same.  [facility] [address] [place] [phone]   [facility] [address], [place] or [address] [phone]   [facility] [address], [place] [phone], phone; [phone], fax Sees patients 1st and 3rd Saturday of every month.  Must not qualify for public or private insurance (i.e. Medicaid, Medicare, [place] Health Choice, Veterans' Benefits)  Household income should be no more than 200% of the poverty level The clinic cannot treat you if you are  pregnant or think you are pregnant  Sexually transmitted diseases are not treated at the clinic.    Dental Care: Organization         Address  Phone  Notes  [facility] [address] [place], [place] [phone] Accepts children up to age 68 who are enrolled in [place] or [place] Health Choice; pregnant women with a Medicaid card; and children who have applied for Medicaid or [place] Health Choice, but were declined, whose parents can pay a reduced fee at time of service.  [facility] Department of [facility]  [address], [place] [phone] Accepts children up to age 39 who are enrolled in [place] or [place] Health Choice; pregnant women with a Medicaid card; and children who have applied for Medicaid or  Health Choice, but were declined, whose parents can pay a reduced fee at time of service.  [facility]  [address] [place], [place] [phone] Patients are seen by appointment only. Walk-ins are not accepted. [facility] will see patients 46 years of age and older. Monday - Tuesday (8am-5pm) Most Wednesdays (8:30-5pm) $30 per visit, cash only  [facility] Access PRO[facility]  [address], [facility] [phone] Patients are seen by appointment only. Walk-ins are not accepted. [facility] will see patients 16 years of age and older. One Wednesday Evening (Monthly: Volunteer Based).  $30 per visit, cash only  [employer]  [phone] for adults; Children under age 29, call Graduate Pediatric Dentistry at [phone]. Children aged 4-14, please call [phone] to request a pediatric application.  Dental services are provided in all areas of dental care including fillings, crowns and bridges, complete and partial dentures, implants, gum treatment, root canals, and extractions. Preventive care is also provided. Treatment is provided to  both adults and children. Patients are selected via a lottery and there is often a waiting list.   [facility] [address], [place]  [phone] [email or web address]   [facility] [address] [place], [place] [phone], Ext. [phone] Second and Fourth Thursday of each month, opens at 6:30 AM; Clinic ends at 9 AM.  Patients are seen on a first-come first-served basis, and a limited number  are seen during each clinic.   [facility]  [address], [place], [place] [phone]   Eligibility Requirements You must have lived in [place], or [place] counties for at least the last three months.   You cannot be eligible for state or federal sponsored National Cityhealthcare insurance, including CIG[employer]Veterans Administration, [place]icaid, or [employer]Medicare.   You generally cannot be eligible for healthcare insurance through your employer.    How to apply: Eligibility screenings are held every Tuesday and Wednesday afternoon from 1:00 pm until 4:00 pm. You do not need an appointment for the interview!  [facility] [address], [place], [place] [phone]   [facility]  [phone]   [facility]  [phone]   [facility]  [phone]    Behavioral Health Resources in the Community: Intensive Outpatient Programs Organization         Address  Phone  Notes  [facility] [address], [place], [place] [phone]   [facility] Outpatient [address], [place], [place] [phone]   [facility] [address], [place], [place]  [phone]   [facility] [address],  [place], [place] [phone] or [phone]   Substance Abuse Resources Organization         Address  Phone  Notes  [facility] Drug Services  [phone]   [facility]  [phone]   [facility]   [phone]   [facility]  [phone]   Residential & Outpatient Substance Abuse Program  [phone]   Psychological Services Organization         Address  Phone  Notes  [facility]  [phone]- [phone]   [facility]  [phone]- [phone]   [facility] [address], [place] [phone] or [phone]    Mobile Crisis Teams Organization         Address  Phone  Notes  [facility], Mobile Crisis Care Unit  [phone]   [facility]  [address] [place], [place] [phone]   Doristine LocksSharon DeEsch [address] [place] [phone]    Self-Help/Support Groups Organization         Address  Phone             Notes  [facility] [place] - variety of support groups  [phone]- [phone] Call for more information  Narcotics Anonymous (NA), [facility] [address], [place]  2 meetings at this location   Statisticianesidential Treatment Programs Organization         Address  Phone  Notes  [facility] [address],    [place]  [phone]   [facility]  [address], [place] [phone], [place], [place] [phone]   [facility] [address], [place] [phone] Admissions: 8am-3pm M-F  [facility] [address]    [place], [place] [phone]   [facility] [address], [place], [place] [phone]   [facility] [address]  [place], [place] [phone]   [facility] - Intensive Outpatient [address], [place], [place] [phone]   [facility] [address]  [place], [place] [phone] or [phone]   [facility] ([facility]) [address] [place], [place] [phone] Accepts Medicaid  [facility] [address]  [place] [phone] Substance Abuse/Addiction Treatment   [facility] Organization         Address  Phone  Notes  [facility]  [phone]   Angie Fava, PhD [address] [place], [place]   [phone] or [phone]   [facility] Behavioral   [address] [place], [place] [phone]   [facility] [address], [place], [place] [phone] Insurance/Medicaid/sponsorship through [facility] and Families [address]                                    [place], [place] [phone] Therapy/tele-psych/case  [facility] [address], [place] [phone]    Dr. Lolly Mustache  [phone]   [facility] [place]  [employer] [facility] Dept. 1) [address],  2) [address] [place] 3)  [address], [place] [phone]  [phone]   [facility] Child Abuse Hotline 470-362-8147 or [phone] (After Hours)

## 2013-07-23 NOTE — ED Provider Notes (Signed)
Medical screening examination/treatment/procedure(s) were performed by non-physician practitioner and as supervising physician I was immediately available for consultation/collaboration.   EKG Interpretation None        Kentrell Guettler L Arvil Utz, MD 07/23/13 1430 

## 2013-07-24 LAB — CULTURE, GROUP A STREP

## 2013-09-09 ENCOUNTER — Encounter (HOSPITAL_COMMUNITY): Payer: Self-pay | Admitting: Emergency Medicine

## 2013-09-09 ENCOUNTER — Emergency Department (HOSPITAL_COMMUNITY)
Admission: EM | Admit: 2013-09-09 | Discharge: 2013-09-09 | Disposition: A | Discharge status: Home / Self Care | Payer: Medicaid Other | Attending: Emergency Medicine | Admitting: Emergency Medicine

## 2013-09-09 DIAGNOSIS — J069 Acute upper respiratory infection, unspecified: Secondary | ICD-10-CM | POA: Diagnosis not present

## 2013-09-09 DIAGNOSIS — B9789 Other viral agents as the cause of diseases classified elsewhere: Secondary | ICD-10-CM

## 2013-09-09 DIAGNOSIS — O9989 Other specified diseases and conditions complicating pregnancy, childbirth and the puerperium: Secondary | ICD-10-CM | POA: Diagnosis not present

## 2013-09-09 DIAGNOSIS — R61 Generalized hyperhidrosis: Secondary | ICD-10-CM | POA: Insufficient documentation

## 2013-09-09 DIAGNOSIS — R197 Diarrhea, unspecified: Secondary | ICD-10-CM | POA: Insufficient documentation

## 2013-09-09 DIAGNOSIS — R109 Unspecified abdominal pain: Secondary | ICD-10-CM | POA: Diagnosis not present

## 2013-09-09 DIAGNOSIS — K439 Ventral hernia without obstruction or gangrene: Secondary | ICD-10-CM | POA: Insufficient documentation

## 2013-09-09 DIAGNOSIS — R350 Frequency of micturition: Secondary | ICD-10-CM | POA: Insufficient documentation

## 2013-09-09 DIAGNOSIS — R3915 Urgency of urination: Secondary | ICD-10-CM | POA: Insufficient documentation

## 2013-09-09 DIAGNOSIS — Z3490 Encounter for supervision of normal pregnancy, unspecified, unspecified trimester: Secondary | ICD-10-CM

## 2013-09-09 LAB — URINALYSIS, ROUTINE W REFLEX MICROSCOPIC
BILIRUBIN URINE: NEGATIVE
Glucose, UA: NEGATIVE mg/dL
HGB URINE DIPSTICK: NEGATIVE
KETONES UR: 15 mg/dL — AB
Leukocytes, UA: NEGATIVE
Nitrite: NEGATIVE
PROTEIN: NEGATIVE mg/dL
Specific Gravity, Urine: 1.023 (ref 1.005–1.030)
UROBILINOGEN UA: 1 mg/dL (ref 0.0–1.0)
pH: 6.5 (ref 5.0–8.0)

## 2013-09-09 LAB — RAPID STREP SCREEN (MED CTR MEBANE ONLY): Streptococcus, Group A Screen (Direct): NEGATIVE

## 2013-09-09 LAB — POC URINE PREG, ED: Preg Test, Ur: POSITIVE — AB

## 2013-09-09 MED ORDER — ACETAMINOPHEN 325 MG PO TABS
650.0000 mg | ORAL_TABLET | Freq: Once | ORAL | Status: AC
  Administered 2013-09-09: 650 mg via ORAL
  Filled 2013-09-09: qty 2

## 2013-09-09 NOTE — Discharge Instructions (Signed)
Upper Respiratory Infection, Adult   Rest, drink plenty of fluids, use honey for cough, and you can use tylenol for your myalgias and fever.  Your symptoms should resolve in 7-10.  See your primary care for worsening of symptoms. An upper respiratory infection (URI) is also known as the common cold. It is often caused by a type of germ (virus). Colds are easily spread (contagious). You can pass it to others by kissing, coughing, sneezing, or drinking out of the same glass. Usually, you get better in 1 or 2 weeks.  HOME CARE   Only take medicine as told by your doctor.  Use a warm mist humidifier or breathe in steam from a hot shower.  Drink enough water and fluids to keep your pee (urine) clear or pale yellow.  Get plenty of rest.  Return to work when your temperature is back to normal or as told by your doctor. You may use a face mask and wash your hands to stop your cold from spreading. GET HELP RIGHT AWAY IF:   After the first few days, you feel you are getting worse.  You have questions about your medicine.  You have chills, shortness of breath, or brown or red spit (mucus).  You have yellow or brown snot (nasal discharge) or pain in the face, especially when you bend forward.  You have a fever, puffy (swollen) neck, pain when you swallow, or white spots in the back of your throat.  You have a bad headache, ear pain, sinus pain, or chest pain.  You have a high-pitched whistling sound when you breathe in and out (wheezing).  You have a lasting cough or cough up blood.  You have sore muscles or a stiff neck. MAKE SURE YOU:   Understand these instructions.  Will watch your condition.  Will get help right away if you are not doing well or get worse. Document Released: 09/12/2007 Document Revised: 06/18/2011 Document Reviewed: 07/31/2010 [facility] Patient Information 2014 [place], [place].

## 2013-09-09 NOTE — ED Notes (Addendum)
Sore throat since Friday. Difficulty swallowing. Requesting pregnancy check. Ambulatory. NAD

## 2013-09-09 NOTE — ED Provider Notes (Signed)
CSN: [id number]     Arrival date & time 09/09/13  1530 History   First MD Initiated Contact with Patient 09/09/13 1545     Chief Complaint  Patient presents with  . Sore Throat  . Possible Pregnancy   HPI Comments: Patient is a 23 y.o. Female who [facility] the [facility] ED with 5 days of sore throat.  Sore throat is associated with cough, chills, subjective fever, myalgias, fatigue, orthostatic dizziness, nausea, congestion, and one episode of loose stools.  Patient also complains of abdominal pain but only with coughing.  Patient states that she has tried robitussin with little relief.  She has no aggravating factors at this time.  She denies vomiting, constipation, hematemesis, melena, hematochezia, facial pain, sputum production.  She endorses a sick contact at work who has told her she had an URI.  Patient is also concerned that she is pregnant.  LMP is 08/06/13.  She does endorse having unprotected sex with one partner.  Patient was using depoprovera but had her last injection over four months ago.     Patient is a 23 y.o. female presenting with pharyngitis and pregnancy problem. The history is provided by the patient. No language interpreter was used.  Sore Throat Associated symptoms include abdominal pain, chills, congestion, coughing, diaphoresis, fatigue, a fever, nausea and a sore throat. Pertinent negatives include no chest pain or vomiting.  Possible Pregnancy  Primary symptoms include abdominal pain. Patient reports no vaginal bleeding and no vaginal discharge.  Associated symptoms include a fever and nausea.  Associated symptoms include no dysuria, no shortness of breath and no vomiting.    Past Medical History  Diagnosis Date  . Pregnancy induced hypertension   . Hernia    History reviewed. No pertinent past surgical history. History reviewed. No pertinent family history. History  Substance Use Topics  . Smoking status: Never Smoker   . Smokeless tobacco: Not on file  . Alcohol  Use: No   OB History   Grav Para Term Preterm Abortions TAB SAB Ect Mult Living   2 2 2       2      Review of Systems  Constitutional: Positive for fever, chills, diaphoresis, activity change and fatigue.  HENT: Positive for congestion, postnasal drip, rhinorrhea and sore throat. Negative for ear pain, facial swelling and sinus pressure.   Eyes: Negative for visual disturbance.  Respiratory: Positive for cough. Negative for chest tightness, shortness of breath and wheezing.   Cardiovascular: Negative for chest pain and leg swelling.  Gastrointestinal: Positive for nausea, abdominal pain and diarrhea. Negative for vomiting, constipation and blood in stool.  Genitourinary: Positive for urgency and frequency. Negative for dysuria, hematuria, vaginal bleeding, vaginal discharge and vaginal pain.  All other systems reviewed and are negative.     Allergies  Review of patient's allergies indicates no known allergies.  Home Medications   Prior to Admission medications   Medication Sig Start Date End Date Taking? Authorizing Provider  OVER THE COUNTER MEDICATION    Yes Historical Provider, MD  Prenatal Vit-Fe Fumarate-FA (PRENATAL MULTIVITAMIN) TABS Take 1 tablet by mouth daily.   Yes Historical Provider, MD   BP 117/71  Pulse 92  Temp(Src) 98.9 F (37.2 C)  Resp 19  Wt 133 lb (60.328 kg)  SpO2 99% Physical Exam  Nursing note and vitals reviewed. Constitutional: She is oriented to person, place, and time. She appears well-developed and well-nourished. No distress.  HENT:  Head: Normocephalic and atraumatic.  Right Ear:  Tympanic membrane and external ear normal.  Left Ear: Tympanic membrane and external ear normal.  Nose: Mucosal edema present. No sinus tenderness. Right sinus exhibits no maxillary sinus tenderness and no frontal sinus tenderness. Left sinus exhibits no maxillary sinus tenderness and no frontal sinus tenderness.  Mouth/Throat: Mucous membranes are normal. Posterior  oropharyngeal erythema present.  Neck: Normal range of motion. Neck supple.  Cardiovascular: Normal rate, regular rhythm, normal heart sounds and intact distal pulses.  Exam reveals no gallop and no friction rub.   No murmur heard. Pulmonary/Chest: Effort normal and breath sounds normal. No respiratory distress. She has no wheezes. She has no rales. She exhibits no tenderness.  Abdominal: Soft. Bowel sounds are normal. She exhibits no distension and no mass. There is tenderness in the epigastric area and suprapubic area. There is no rigidity, no rebound, no guarding, no CVA tenderness, no tenderness at McBurney's point and negative Murphy's sign. A hernia is present. Hernia confirmed positive in the ventral area.    Musculoskeletal: Normal range of motion.  Lymphadenopathy:    She has no cervical adenopathy.  Neurological: She is alert and oriented to person, place, and time.  Skin: Skin is warm and dry. She is not diaphoretic.  Psychiatric: She has a normal mood and affect. Her behavior is normal. Judgment and thought content normal.    ED Course  Procedures (including critical care time) Labs Review Labs Reviewed  URINALYSIS, ROUTINE W REFLEX MICROSCOPIC - Abnormal; Notable for the following:    Ketones, ur 15 (*)    All other components within normal limits  POC URINE PREG, ED - Abnormal; Notable for the following:    Preg Test, Ur POSITIVE (*)    All other components within normal limits  RAPID STREP SCREEN  URINE CULTURE  CULTURE, GROUP A STREP    Imaging Review No results found.   EKG Interpretation None      MDM   Final diagnoses:  Viral URI with cough  Pregnancy   Patient presents to the [facility] ED with 1 week of sore throat and associated upper respiratory symptoms and also concern for pregnancy.  Urine pregnancy is positive.  Abdominal exam is benign at this time.  Physical exam is consistent with viral URI and rapid strep is negative at this time.  Will treat  symptomatically with tylenol for myalgias, robitussin or honey for cough, saline in her nose for congestion and I have urged her to rest and drink lots of fluids over the next few days.  Return precautions given included worsening of symptoms, fever which does not respond to tylenol, and severe abdominal pain.      Clydie Braun, PA-C 09/09/13 1718

## 2013-09-10 ENCOUNTER — Inpatient Hospital Stay (HOSPITAL_COMMUNITY)
Admission: AD | Admit: 2013-09-10 | Discharge: 2013-09-10 | Disposition: A | Discharge status: Home / Self Care | Payer: Medicaid Other | Source: Ambulatory Visit | Attending: Obstetrics and Gynecology | Admitting: Obstetrics and Gynecology

## 2013-09-10 ENCOUNTER — Encounter (HOSPITAL_COMMUNITY): Payer: Self-pay | Admitting: *Deleted

## 2013-09-10 LAB — URINE CULTURE
COLONY COUNT: NO GROWTH
CULTURE: NO GROWTH

## 2013-09-10 NOTE — MAU Note (Signed)
Pt states she had a positive preg test at [facility] yesterday and just wants a confirmation letter.

## 2013-09-10 NOTE — ED Provider Notes (Signed)
Medical screening examination/treatment/procedure(s) were performed by non-physician practitioner and as supervising physician I was immediately available for consultation/collaboration.   EKG Interpretation None        Junius Argyle, MD 09/10/13 1026

## 2013-09-11 LAB — CULTURE, GROUP A STREP

## 2013-10-07 ENCOUNTER — Telehealth: Payer: Self-pay | Admitting: *Deleted

## 2013-10-07 NOTE — Telephone Encounter (Signed)
Patient called to request paperwork with restrictions for her hernia.  8:00 Left message on patient phone- she needs to get that paperwork from her primary care as that is not something we manage as her GYN provider.

## 2013-10-08 ENCOUNTER — Telehealth: Payer: Self-pay | Admitting: *Deleted

## 2013-10-08 NOTE — Telephone Encounter (Signed)
Patient calling regarding paperwork. Patient states she never had restrictions and she has not seen another provider. 5:46 LM on VM to CB

## 2013-10-21 ENCOUNTER — Ambulatory Visit (INDEPENDENT_AMBULATORY_CARE_PROVIDER_SITE_OTHER): Payer: Medicaid Other | Admitting: Family Medicine

## 2013-10-21 ENCOUNTER — Encounter: Payer: Self-pay | Admitting: Family Medicine

## 2013-10-21 VITALS — BP 119/85 | HR 80 | Wt 143.0 lb

## 2013-10-21 DIAGNOSIS — O0990 Supervision of high risk pregnancy, unspecified, unspecified trimester: Secondary | ICD-10-CM | POA: Insufficient documentation

## 2013-10-21 DIAGNOSIS — Z3482 Encounter for supervision of other normal pregnancy, second trimester: Secondary | ICD-10-CM

## 2013-10-21 DIAGNOSIS — O09292 Supervision of pregnancy with other poor reproductive or obstetric history, second trimester: Secondary | ICD-10-CM

## 2013-10-21 DIAGNOSIS — O09299 Supervision of pregnancy with other poor reproductive or obstetric history, unspecified trimester: Secondary | ICD-10-CM

## 2013-10-21 DIAGNOSIS — Z3480 Encounter for supervision of other normal pregnancy, unspecified trimester: Secondary | ICD-10-CM

## 2013-10-21 DIAGNOSIS — Z8632 Personal history of gestational diabetes: Principal | ICD-10-CM

## 2013-10-21 DIAGNOSIS — O09293 Supervision of pregnancy with other poor reproductive or obstetric history, third trimester: Secondary | ICD-10-CM | POA: Insufficient documentation

## 2013-10-21 NOTE — Patient Instructions (Signed)
Pregnancy - First Trimester During sexual intercourse, millions of sperm go into the vagina. Only 1 sperm will penetrate and fertilize the female egg while it is in the Fallopian tube. One week later, the fertilized egg implants into the wall of the uterus. An embryo begins to develop into a baby. At 6 to 8 weeks, the eyes and face are formed and the heartbeat can be seen on ultrasound. At the end of 12 weeks (first trimester), all the baby's organs are formed. Now that you are pregnant, you will want to do everything you can to have a healthy baby. Two of the most important things are to get good prenatal care and follow your caregiver's instructions. Prenatal care is all the medical care you receive before the baby's birth. It is given to prevent, find, and treat problems during the pregnancy and childbirth. PRENATAL EXAMS  During prenatal visits, your weight, blood pressure, and urine are checked. This is done to make sure you are healthy and progressing normally during the pregnancy.  A pregnant woman should gain 25 to 35 pounds during the pregnancy. However, if you are overweight or underweight, your caregiver will advise you regarding your weight.  Your caregiver will ask and answer questions for you.  Blood work, cervical cultures, other necessary tests, and a Pap test are done during your prenatal exams. These tests are done to check on your health and the probable health of your baby. Tests are strongly recommended and done for HIV with your permission. This is the virus that causes AIDS. These tests are done because medicines can be given to help prevent your baby from being born with this infection should you have been infected without knowing it. Blood work is also used to find out your blood type, previous infections, and follow your blood levels (hemoglobin).  Low hemoglobin (anemia) is common during pregnancy. Iron and vitamins are given to help prevent this. Later in the pregnancy,  blood tests for diabetes will be done along with any other tests if any problems develop.  You may need other tests to make sure you and the baby are doing well. CHANGES DURING THE FIRST TRIMESTER  Your body goes through many changes during pregnancy. They vary from person to person. Talk to your caregiver about changes you notice and are concerned about. Changes can include:  Your menstrual period stops.  The egg and sperm carry the genes that determine what you look like. Genes from you and your partner are forming a baby. The female genes determine whether the baby is a boy or a girl.  Your body increases in girth and you may feel bloated.  Feeling sick to your stomach (nauseous) and throwing up (vomiting). If the vomiting is uncontrollable, call your caregiver.  Your breasts will begin to enlarge and become tender.  Your nipples may stick out more and become darker.  The need to urinate more. Painful urination may mean you have a bladder infection.  Tiring easily.  Loss of appetite.  Cravings for certain kinds of food.  At first, you may gain or lose a couple of pounds.  You may have changes in your emotions from day to day (excited to be pregnant or concerned something may go wrong with the pregnancy and baby).  You may have more vivid and strange dreams. HOME CARE INSTRUCTIONS   It is very important to avoid all smoking, alcohol and non-prescribed drugs during your pregnancy. These affect the formation and growth of the baby.   Avoid chemicals while pregnant to ensure the delivery of a healthy infant.  Start your prenatal visits by the 12th week of pregnancy. They are usually scheduled monthly at first, then more often in the last 2 months before delivery. Keep your caregiver's appointments. Follow your caregiver's instructions regarding medicine use, blood and lab tests, exercise, and diet.  During pregnancy, you are providing food for you and your baby. Eat regular,  well-balanced meals. Choose foods such as meat, fish, milk and other low fat dairy products, vegetables, fruits, and whole-grain breads and cereals. Your caregiver will tell you of the ideal weight gain.  You can help morning sickness by keeping soda crackers at the bedside. Eat a couple before arising in the morning. You may want to use the crackers without salt on them.  Eating 4 to 5 small meals rather than 3 large meals a day also may help the nausea and vomiting.  Drinking liquids between meals instead of during meals also seems to help nausea and vomiting.  A physical sexual relationship may be continued throughout pregnancy if there are no other problems. Problems may be early (premature) leaking of amniotic fluid from the membranes, vaginal bleeding, or belly (abdominal) pain.  Exercise regularly if there are no restrictions. Check with your caregiver or physical therapist if you are unsure of the safety of some of your exercises. Greater weight gain will occur in the last 2 trimesters of pregnancy. Exercising will help:  Control your weight.  Keep you in shape.  Prepare you for labor and delivery.  Help you lose your pregnancy weight after you deliver your baby.  Wear a good support or jogging bra for breast tenderness during pregnancy. This may help if worn during sleep too.  Ask when prenatal classes are available. Begin classes when they are offered.  Do not use hot tubs, steam rooms, or saunas.  Wear your seat belt when driving. This protects you and your baby if you are in an accident.  Avoid raw meat, uncooked cheese, cat litter boxes, and soil used by cats throughout the pregnancy. These carry germs that can cause birth defects in the baby.  The first trimester is a good time to visit your dentist for your dental health. Getting your teeth cleaned is okay. Use a softer toothbrush and brush gently during pregnancy.  Ask for help if you have financial, counseling, or  nutritional needs during pregnancy. Your caregiver will be able to offer counseling for these needs as well as refer you for other special needs.  Do not take any medicines or herbs unless told by your caregiver.  Inform your caregiver if there is any mental or physical domestic violence.  Make a list of emergency phone numbers of family, friends, hospital, and police and fire departments.  Write down your questions. Take them to your prenatal visit.  Do not douche.  Do not cross your legs.  If you have to stand for long periods of time, rotate you feet or take small steps in a circle.  You may have more vaginal secretions that may require a sanitary pad. Do not use tampons or scented sanitary pads. MEDICINES AND DRUG USE IN PREGNANCY  Take prenatal vitamins as directed. The vitamin should contain 1 milligram of folic acid. Keep all vitamins out of reach of children. Only a couple vitamins or tablets containing iron may be fatal to a baby or young child when ingested.  Avoid use of all medicines, including herbs, over-the-counter medicines, not   prescribed or suggested by your caregiver. Only take over-the-counter or prescription medicines for pain, discomfort, or fever as directed by your caregiver. Do not use aspirin, ibuprofen, or naproxen unless directed by your caregiver.  Let your caregiver also know about herbs you may be using.  Alcohol is related to a number of birth defects. This includes fetal alcohol syndrome. All alcohol, in any form, should be avoided completely. Smoking will cause low birth rate and premature babies.  Street or illegal drugs are very harmful to the baby. They are absolutely forbidden. A baby born to an addicted mother will be addicted at birth. The baby will go through the same withdrawal an adult does.  Let your caregiver know about any medicines that you have to take and for what reason you take them. SEEK MEDICAL CARE IF:  You have any concerns or  worries during your pregnancy. It is better to call with your questions if you feel they cannot wait, rather than worry about them. SEEK IMMEDIATE MEDICAL CARE IF:   An unexplained oral temperature above 102 F (38.9 C) develops, or as your caregiver suggests.  You have leaking of fluid from the vagina (birth canal). If leaking membranes are suspected, take your temperature and inform your caregiver of this when you call.  There is vaginal spotting or bleeding. Notify your caregiver of the amount and how many pads are used.  You develop a bad smelling vaginal discharge with a change in the color.  You continue to feel sick to your stomach (nauseated) and have no relief from remedies suggested. You vomit blood or coffee ground-like materials.  You lose more than 2 pounds of weight in 1 week.  You gain more than 2 pounds of weight in 1 week and you notice swelling of your face, hands, feet, or legs.  You gain 5 pounds or more in 1 week (even if you do not have swelling of your hands, face, legs, or feet).  You get exposed to German measles and have never had them.  You are exposed to fifth disease or chickenpox.  You develop belly (abdominal) pain. Round ligament discomfort is a common non-cancerous (benign) cause of abdominal pain in pregnancy. Your caregiver still must evaluate this.  You develop headache, fever, diarrhea, pain with urination, or shortness of breath.  You fall or are in a car accident or have any kind of trauma.  There is mental or physical violence in your home. Document Released: 03/20/2001 Document Revised: 12/19/2011 Document Reviewed: 02/03/2013 [employer] Patient Information 2015 [employer]. This information is not intended to replace advice given to you by your health care provider. Make sure you discuss any questions you have with your health care provider.  Breastfeeding Deciding to breastfeed is one of the best choices you can make for you and your  baby. A change in hormones during pregnancy causes your breast tissue to grow and increases the number and size of your milk ducts. These hormones also allow proteins, sugars, and fats from your blood supply to make breast milk in your milk-producing glands. Hormones prevent breast milk from being released before your baby is born as well as prompt milk flow after birth. Once breastfeeding has begun, thoughts of your baby, as well as his or her sucking or crying, can stimulate the release of milk from your milk-producing glands.  BENEFITS OF BREASTFEEDING For Your Baby  Your first milk (colostrum) helps your baby's digestive system function better.   There are antibodies in   your milk that help your baby fight off infections.   Your baby has a lower incidence of asthma, allergies, and sudden infant death syndrome.   The nutrients in breast milk are better for your baby than infant formulas and are designed uniquely for your baby's needs.   Breast milk improves your baby's brain development.   Your baby is less likely to develop other conditions, such as childhood obesity, asthma, or type 2 diabetes mellitus.  For You   Breastfeeding helps to create a very special bond between you and your baby.   Breastfeeding is convenient. Breast milk is always available at the correct temperature and costs nothing.   Breastfeeding helps to burn calories and helps you lose the weight gained during pregnancy.   Breastfeeding makes your uterus contract to its prepregnancy size faster and slows bleeding (lochia) after you give birth.   Breastfeeding helps to lower your risk of developing type 2 diabetes mellitus, osteoporosis, and breast or ovarian cancer later in life. SIGNS THAT YOUR BABY IS HUNGRY Early Signs of Hunger  Increased alertness or activity.  Stretching.  Movement of the head from side to side.  Movement of the head and opening of the mouth when the corner of the mouth or  cheek is stroked (rooting).  Increased sucking sounds, smacking lips, cooing, sighing, or squeaking.  Hand-to-mouth movements.  Increased sucking of fingers or hands. Late Signs of Hunger  Fussing.  Intermittent crying. Extreme Signs of Hunger Signs of extreme hunger will require calming and consoling before your baby will be able to breastfeed successfully. Do not wait for the following signs of extreme hunger to occur before you initiate breastfeeding:   Restlessness.  A loud, strong cry.   Screaming. BREASTFEEDING BASICS Breastfeeding Initiation  Find a comfortable place to sit or lie down, with your neck and back well supported.  Place a pillow or rolled up blanket under your baby to bring him or her to the level of your breast (if you are seated). Nursing pillows are specially designed to help support your arms and your baby while you breastfeed.  Make sure that your baby's abdomen is facing your abdomen.   Gently massage your breast. With your fingertips, massage from your chest wall toward your nipple in a circular motion. This encourages milk flow. You may need to continue this action during the feeding if your milk flows slowly.  Support your breast with 4 fingers underneath and your thumb above your nipple. Make sure your fingers are well away from your nipple and your baby's mouth.   Stroke your baby's lips gently with your finger or nipple.   When your baby's mouth is open wide enough, quickly bring your baby to your breast, placing your entire nipple and as much of the colored area around your nipple (areola) as possible into your baby's mouth.   More areola should be visible above your baby's upper lip than below the lower lip.   Your baby's tongue should be between his or her lower gum and your breast.   Ensure that your baby's mouth is correctly positioned around your nipple (latched). Your baby's lips should create a seal on your breast and be turned  out (everted).  It is common for your baby to suck about 2-3 minutes in order to start the flow of breast milk. Latching Teaching your baby how to latch on to your breast properly is very important. An improper latch can cause nipple pain and decreased milk supply   for you and poor weight gain in your baby. Also, if your baby is not latched onto your nipple properly, he or she may swallow some air during feeding. This can make your baby fussy. Burping your baby when you switch breasts during the feeding can help to get rid of the air. However, teaching your baby to latch on properly is still the best way to prevent fussiness from swallowing air while breastfeeding. Signs that your baby has successfully latched on to your nipple:    Silent tugging or silent sucking, without causing you pain.   Swallowing heard between every 3-4 sucks.    Muscle movement above and in front of his or her ears while sucking.  Signs that your baby has not successfully latched on to nipple:   Sucking sounds or smacking sounds from your baby while breastfeeding.  Nipple pain. If you think your baby has not latched on correctly, slip your finger into the corner of your baby's mouth to break the suction and place it between your baby's gums. Attempt breastfeeding initiation again. Signs of Successful Breastfeeding Signs from your baby:   A gradual decrease in the number of sucks or complete cessation of sucking.   Falling asleep.   Relaxation of his or her body.   Retention of a small amount of milk in his or her mouth.   Letting go of your breast by himself or herself. Signs from you:  Breasts that have increased in firmness, weight, and size 1-3 hours after feeding.   Breasts that are softer immediately after breastfeeding.  Increased milk volume, as well as a change in milk consistency and color by the fifth day of breastfeeding.   Nipples that are not sore, cracked, or bleeding. Signs That  Your Baby is Getting Enough Milk  Wetting at least 3 diapers in a 24-hour period. The urine should be clear and pale yellow by age 5 days.  At least 3 stools in a 24-hour period by age 5 days. The stool should be soft and yellow.  At least 3 stools in a 24-hour period by age 7 days. The stool should be seedy and yellow.  No loss of weight greater than 10% of birth weight during the first 3 days of age.  Average weight gain of 4-7 ounces (113-198 g) per week after age 4 days.  Consistent daily weight gain by age 5 days, without weight loss after the age of 2 weeks. After a feeding, your baby may spit up a small amount. This is common. BREASTFEEDING FREQUENCY AND DURATION Frequent feeding will help you make more milk and can prevent sore nipples and breast engorgement. Breastfeed when you feel the need to reduce the fullness of your breasts or when your baby shows signs of hunger. This is called "breastfeeding on demand." Avoid introducing a pacifier to your baby while you are working to establish breastfeeding (the first 4-6 weeks after your baby is born). After this time you may choose to use a pacifier. Research has shown that pacifier use during the first year of a baby's life decreases the risk of sudden infant death syndrome (SIDS). Allow your baby to feed on each breast as long as he or she wants. Breastfeed until your baby is finished feeding. When your baby unlatches or falls asleep while feeding from the first breast, offer the second breast. Because newborns are often sleepy in the first few weeks of life, you may need to awaken your baby to get him or   her to feed. Breastfeeding times will vary from baby to baby. However, the following rules can serve as a guide to help you ensure that your baby is properly fed:  Newborns (babies 4 weeks of age or younger) may breastfeed every 1-3 hours.  Newborns should not go longer than 3 hours during the day or 5 hours during the night without  breastfeeding.  You should breastfeed your baby a minimum of 8 times in a 24-hour period until you begin to introduce solid foods to your baby at around 6 months of age. BREAST MILK PUMPING Pumping and storing breast milk allows you to ensure that your baby is exclusively fed your breast milk, even at times when you are unable to breastfeed. This is especially important if you are going back to work while you are still breastfeeding or when you are not able to be present during feedings. Your lactation consultant can give you guidelines on how long it is safe to store breast milk.  A breast pump is a machine that allows you to pump milk from your breast into a sterile bottle. The pumped breast milk can then be stored in a refrigerator or freezer. Some breast pumps are operated by hand, while others use electricity. Ask your lactation consultant which type will work best for you. Breast pumps can be purchased, but some hospitals and breastfeeding support groups lease breast pumps on a monthly basis. A lactation consultant can teach you how to hand express breast milk, if you prefer not to use a pump.  CARING FOR YOUR BREASTS WHILE YOU BREASTFEED Nipples can become dry, cracked, and sore while breastfeeding. The following recommendations can help keep your breasts moisturized and healthy:  Avoid using soap on your nipples.   Wear a supportive bra. Although not required, special nursing bras and tank tops are designed to allow access to your breasts for breastfeeding without taking off your entire bra or top. Avoid wearing underwire-style bras or extremely tight bras.  Air dry your nipples for 3-4minutes after each feeding.   Use only cotton bra pads to absorb leaked breast milk. Leaking of breast milk between feedings is normal.   Use lanolin on your nipples after breastfeeding. Lanolin helps to maintain your skin's normal moisture barrier. If you use pure lanolin, you do not need to wash it off  before feeding your baby again. Pure lanolin is not toxic to your baby. You may also hand express a few drops of breast milk and gently massage that milk into your nipples and allow the milk to air dry. In the first few weeks after giving birth, some women experience extremely full breasts (engorgement). Engorgement can make your breasts feel heavy, warm, and tender to the touch. Engorgement peaks within 3-5 days after you give birth. The following recommendations can help ease engorgement:  Completely empty your breasts while breastfeeding or pumping. You may want to start by applying warm, moist heat (in the shower or with warm water-soaked hand towels) just before feeding or pumping. This increases circulation and helps the milk flow. If your baby does not completely empty your breasts while breastfeeding, pump any extra milk after he or she is finished.  Wear a snug bra (nursing or regular) or tank top for 1-2 days to signal your body to slightly decrease milk production.  Apply ice packs to your breasts, unless this is too uncomfortable for you.  Make sure that your baby is latched on and positioned properly while breastfeeding. If engorgement   persists after 48 hours of following these recommendations, contact your health care provider or a lactation consultant. OVERALL HEALTH CARE RECOMMENDATIONS WHILE BREASTFEEDING  Eat healthy foods. Alternate between meals and snacks, eating 3 of each per day. Because what you eat affects your breast milk, some of the foods may make your baby more irritable than usual. Avoid eating these foods if you are sure that they are negatively affecting your baby.  Drink milk, fruit juice, and water to satisfy your thirst (about 10 glasses a day).   Rest often, relax, and continue to take your prenatal vitamins to prevent fatigue, stress, and anemia.  Continue breast self-awareness checks.  Avoid chewing and smoking tobacco.  Avoid alcohol and drug use. Some  medicines that may be harmful to your baby can pass through breast milk. It is important to ask your health care provider before taking any medicine, including all over-the-counter and prescription medicine as well as vitamin and herbal supplements. It is possible to become pregnant while breastfeeding. If birth control is desired, ask your health care provider about options that will be safe for your baby. SEEK MEDICAL CARE IF:   You feel like you want to stop breastfeeding or have become frustrated with breastfeeding.  You have painful breasts or nipples.  Your nipples are cracked or bleeding.  Your breasts are red, tender, or warm.  You have a swollen area on either breast.  You have a fever or chills.  You have nausea or vomiting.  You have drainage other than breast milk from your nipples.  Your breasts do not become full before feedings by the fifth day after you give birth.  You feel sad and depressed.  Your baby is too sleepy to eat well.  Your baby is having trouble sleeping.   Your baby is wetting less than 3 diapers in a 24-hour period.  Your baby has less than 3 stools in a 24-hour period.  Your baby's skin or the white part of his or her eyes becomes yellow.   Your baby is not gaining weight by 5 days of age. SEEK IMMEDIATE MEDICAL CARE IF:   Your baby is overly tired (lethargic) and does not want to wake up and feed.  Your baby develops an unexplained fever. Document Released: 03/26/2005 Document Revised: 03/31/2013 Document Reviewed: 09/17/2012 [employer] Patient Information 2015 [employer]. This information is not intended to replace advice given to you by your health care provider. Make sure you discuss any questions you have with your health care provider.  

## 2013-10-21 NOTE — Progress Notes (Signed)
   Subjective:    Traci [name] is a [id number] 7512w1d being seen today for her first obstetrical visit.  Her obstetrical history is significant for history of pre-eclampsia-1st pregnancy, history of GDM A1-2nd pregnancy.  Pregnancy history fully reviewed.  Patient reports no complaints.  Filed Vitals:   10/21/13 1102  BP: 119/85  Pulse: 80  Weight: 143 lb (64.864 kg)    HISTORY: OB History  Gravida Para Term Preterm AB SAB TAB Ectopic Multiple Living  3 2 2       2     # Outcome Date GA Lbr Len/2nd Weight Sex Delivery Anes PTL Lv  3 CUR           2 TRM 02/27/12 2959w2d 06:48 / 01:04 6 lb 6.7 oz (2.912 kg) M SVD EPI  Y     Comments: WNL  1 TRM 2011 2669w0d 12:00 6 lb 2 oz (2.778 kg) M SVD EPI  Y     Past Medical History  Diagnosis Date  . Pregnancy induced hypertension   . Hernia    History reviewed. No pertinent past surgical history. Family History  Problem Relation Age of Onset  . Diabetes Maternal Grandmother      Exam    Uterus:   12 wk size  Pelvic Exam:    Skin: normal coloration and turgor, no rashes    Neurologic: oriented   Extremities: normal strength, tone, and muscle mass   HEENT sclera clear, anicteric   Mouth/Teeth mucous membranes moist, pharynx normal without lesions and dental hygiene good   Neck supple   Cardiovascular: regular rate and rhythm, no murmurs or gallops   Respiratory:  appears well, vitals normal, no respiratory distress, acyanotic, normal RR, ear and throat exam is normal, neck free of mass or lymphadenopathy, chest clear, no wheezing, crepitations, rhonchi, normal symmetric air entry   Abdomen: soft, non-tender; bowel sounds normal; no masses,  no organomegaly   TVUS reveals SIUP with + flicker at 12w 1d by CRL.   Assessment:    Pregnancy: [id number] Patient Active Problem List   Diagnosis Date Noted  . Supervision of other normal pregnancy 10/21/2013    Priority: High  . History of gestational diabetes in prior pregnancy,  currently pregnant 10/21/2013    Priority: Medium  . History of pre-eclampsia in prior pregnancy, currently pregnant 10/21/2013    Priority: Medium        Plan:     Initial labs drawn. Prenatal vitamins. Problem list reviewed and updated. Genetic Screening discussed First Screen: ordered.  Ultrasound discussed; fetal survey: discussed.  Follow up in 4 weeks.   Traci [name] 10/21/2013

## 2013-10-22 LAB — OBSTETRIC PANEL
Antibody Screen: NEGATIVE
Basophils Absolute: 0 10*3/uL (ref 0.0–0.1)
Basophils Relative: 0 % (ref 0–1)
EOS PCT: 0 % (ref 0–5)
Eosinophils Absolute: 0 10*3/uL (ref 0.0–0.7)
HEMATOCRIT: 38.1 % (ref 36.0–46.0)
HEMOGLOBIN: 13.2 g/dL (ref 12.0–15.0)
Hepatitis B Surface Ag: NEGATIVE
LYMPHS ABS: 1.5 10*3/uL (ref 0.7–4.0)
LYMPHS PCT: 19 % (ref 12–46)
MCH: 28.8 pg (ref 26.0–34.0)
MCHC: 34.6 g/dL (ref 30.0–36.0)
MCV: 83 fL (ref 78.0–100.0)
Monocytes Absolute: 0.4 10*3/uL (ref 0.1–1.0)
Monocytes Relative: 5 % (ref 3–12)
Neutro Abs: 6.1 10*3/uL (ref 1.7–7.7)
Neutrophils Relative %: 76 % (ref 43–77)
PLATELETS: 218 10*3/uL (ref 150–400)
RBC: 4.59 MIL/uL (ref 3.87–5.11)
RDW: 15.3 % (ref 11.5–15.5)
RUBELLA: 2.17 {index} — AB (ref <0.90)
Rh Type: POSITIVE
WBC: 8 10*3/uL (ref 4.0–10.5)

## 2013-10-22 LAB — CULTURE, OB URINE
Colony Count: NO GROWTH
Organism ID, Bacteria: NO GROWTH

## 2013-10-22 LAB — GC/CHLAMYDIA PROBE AMP
CT Probe RNA: NEGATIVE
GC PROBE AMP APTIMA: NEGATIVE

## 2013-10-22 LAB — HIV ANTIBODY (ROUTINE TESTING W REFLEX): HIV: NONREACTIVE

## 2013-10-23 ENCOUNTER — Telehealth: Payer: Self-pay | Admitting: *Deleted

## 2013-10-23 NOTE — Telephone Encounter (Signed)
Pt has placed calls to office regarding information about a hernia that she had/has.  Pt states she needs paperwork, for new employer, to show that she has never been on restrictions due to hernia.  In review of chart, no notes seen stating anything about hernia.  Pt advised that we would have to discuss with Dr Traci [name] regarding this issue since no notes are seen of condition.  Pt states that Dr Traci [name] is the only physician that knew about hernia. Pt states that she has never seen another physician about hernia because she has never had any problems since pregnancy.  Pt made aware we could contact her if we are able to help in this matter.   Please advise on document/notes stating about hernia/restrictions/health of pt.

## 2013-10-23 NOTE — Telephone Encounter (Signed)
Note routed to Tamela OddiJackson Moore regarding msg

## 2013-10-26 ENCOUNTER — Telehealth: Payer: Self-pay | Admitting: *Deleted

## 2013-10-26 DIAGNOSIS — O219 Vomiting of pregnancy, unspecified: Secondary | ICD-10-CM

## 2013-10-26 MED ORDER — PROMETHAZINE HCL 25 MG PO TABS: 25.0000 mg | ORAL_TABLET | Freq: Four times a day (QID) | ORAL | Status: DC | PRN

## 2013-10-26 NOTE — Telephone Encounter (Signed)
Patient is having increased nausea and vomiting.  She would like medication called in for this.

## 2013-11-01 NOTE — Telephone Encounter (Signed)
Offer appointment to discuss.

## 2013-11-03 NOTE — Telephone Encounter (Signed)
Patient is recieving care at another practice. Call closed.

## 2013-11-03 NOTE — Telephone Encounter (Signed)
Attempt to contact pt.  No answer, LM on VM to contact office.

## 2013-11-16 ENCOUNTER — Encounter: Payer: Medicaid Other | Admitting: Obstetrics & Gynecology

## 2013-11-18 ENCOUNTER — Ambulatory Visit (INDEPENDENT_AMBULATORY_CARE_PROVIDER_SITE_OTHER): Payer: Medicaid Other | Admitting: Obstetrics & Gynecology

## 2013-11-18 ENCOUNTER — Other Ambulatory Visit (HOSPITAL_COMMUNITY)
Admission: RE | Admit: 2013-11-18 | Discharge: 2013-11-18 | Disposition: A | Discharge status: Home / Self Care | Payer: Medicaid Other | Source: Ambulatory Visit | Attending: Obstetrics & Gynecology | Admitting: Obstetrics & Gynecology

## 2013-11-18 ENCOUNTER — Encounter: Payer: Medicaid Other | Admitting: Obstetrics & Gynecology

## 2013-11-18 VITALS — BP 112/74 | HR 85 | Wt 147.0 lb

## 2013-11-18 DIAGNOSIS — Z3480 Encounter for supervision of other normal pregnancy, unspecified trimester: Secondary | ICD-10-CM

## 2013-11-18 DIAGNOSIS — Z01419 Encounter for gynecological examination (general) (routine) without abnormal findings: Secondary | ICD-10-CM | POA: Insufficient documentation

## 2013-11-18 DIAGNOSIS — O09299 Supervision of pregnancy with other poor reproductive or obstetric history, unspecified trimester: Secondary | ICD-10-CM

## 2013-11-18 DIAGNOSIS — Z3482 Encounter for supervision of other normal pregnancy, second trimester: Secondary | ICD-10-CM

## 2013-11-18 DIAGNOSIS — Z8632 Personal history of gestational diabetes: Secondary | ICD-10-CM

## 2013-11-18 DIAGNOSIS — O09292 Supervision of pregnancy with other poor reproductive or obstetric history, second trimester: Secondary | ICD-10-CM

## 2013-11-18 MED ORDER — PRENATAL MULTIVITAMIN CH: 1.0000 | ORAL_TABLET | Freq: Every day | ORAL | Status: DC

## 2013-11-18 MED ORDER — ASPIRIN EC 81 MG PO TBEC: 81.0000 mg | DELAYED_RELEASE_TABLET | Freq: Every day | ORAL | Status: DC

## 2013-11-18 NOTE — Patient Instructions (Signed)
Return to clinic for any obstetric concerns or go to [facility] for evaluation  

## 2013-11-18 NOTE — Progress Notes (Signed)
1 hr GTT, quad screen checked today. Anatomy scan ordered.  Pap smear done today. ASA 81 mg ordered given history of preeclampsia No other complaints or concerns.  Routine obstetric precautions reviewed.

## 2013-11-19 ENCOUNTER — Encounter: Payer: Self-pay | Admitting: Obstetrics & Gynecology

## 2013-11-19 LAB — GLUCOSE TOLERANCE, 1 HOUR (50G) W/O FASTING: Glucose, 1 Hour GTT: 159 mg/dL — ABNORMAL HIGH (ref 70–140)

## 2013-11-23 LAB — AFP, QUAD SCREEN
AFP: 25.3 IU/mL
Curr Gest Age: 20.1 wks.days
Down Syndrome Scr Risk Est: 1:85 {titer}
HCG TOTAL: 28004 m[IU]/mL
INH: 81.7 pg/mL
Interpretation-AFP: POSITIVE — AB
MOM FOR AFP: 0.49
MOM FOR HCG: 1.87
MoM for INH: 0.4
OPEN SPINA BIFIDA: NEGATIVE
TRI 18 SCR RISK EST: NEGATIVE
uE3 Mom: 0.3
uE3 Value: 0.4 ng/mL

## 2013-11-23 LAB — CYTOLOGY - PAP

## 2013-11-24 ENCOUNTER — Encounter: Payer: Self-pay | Admitting: Obstetrics & Gynecology

## 2013-11-24 DIAGNOSIS — O280 Abnormal hematological finding on antenatal screening of mother: Secondary | ICD-10-CM | POA: Insufficient documentation

## 2013-11-26 ENCOUNTER — Emergency Department (HOSPITAL_COMMUNITY)
Admission: EM | Admit: 2013-11-26 | Discharge: 2013-11-26 | Disposition: A | Discharge status: Home / Self Care | Payer: Medicaid Other | Attending: Emergency Medicine | Admitting: Emergency Medicine

## 2013-11-26 ENCOUNTER — Encounter (HOSPITAL_COMMUNITY): Payer: Self-pay | Admitting: Emergency Medicine

## 2013-11-26 ENCOUNTER — Telehealth: Payer: Self-pay | Admitting: *Deleted

## 2013-11-26 DIAGNOSIS — Z79899 Other long term (current) drug therapy: Secondary | ICD-10-CM | POA: Diagnosis not present

## 2013-11-26 DIAGNOSIS — Z7982 Long term (current) use of aspirin: Secondary | ICD-10-CM | POA: Insufficient documentation

## 2013-11-26 DIAGNOSIS — R110 Nausea: Secondary | ICD-10-CM | POA: Insufficient documentation

## 2013-11-26 DIAGNOSIS — O9989 Other specified diseases and conditions complicating pregnancy, childbirth and the puerperium: Secondary | ICD-10-CM | POA: Diagnosis not present

## 2013-11-26 DIAGNOSIS — K920 Hematemesis: Secondary | ICD-10-CM | POA: Diagnosis not present

## 2013-11-26 DIAGNOSIS — K429 Umbilical hernia without obstruction or gangrene: Secondary | ICD-10-CM | POA: Insufficient documentation

## 2013-11-26 DIAGNOSIS — Z3490 Encounter for supervision of normal pregnancy, unspecified, unspecified trimester: Secondary | ICD-10-CM

## 2013-11-26 DIAGNOSIS — K439 Ventral hernia without obstruction or gangrene: Secondary | ICD-10-CM | POA: Insufficient documentation

## 2013-11-26 LAB — URINALYSIS, ROUTINE W REFLEX MICROSCOPIC
BILIRUBIN URINE: NEGATIVE
Glucose, UA: NEGATIVE mg/dL
HGB URINE DIPSTICK: NEGATIVE
Ketones, ur: NEGATIVE mg/dL
Leukocytes, UA: NEGATIVE
Nitrite: NEGATIVE
PH: 6 (ref 5.0–8.0)
Protein, ur: NEGATIVE mg/dL
SPECIFIC GRAVITY, URINE: 1.015 (ref 1.005–1.030)
Urobilinogen, UA: 0.2 mg/dL (ref 0.0–1.0)

## 2013-11-26 LAB — CBC WITH DIFFERENTIAL/PLATELET
Basophils Absolute: 0 10*3/uL (ref 0.0–0.1)
Basophils Relative: 0 % (ref 0–1)
Eosinophils Absolute: 0.1 10*3/uL (ref 0.0–0.7)
Eosinophils Relative: 1 % (ref 0–5)
HCT: 35.6 % — ABNORMAL LOW (ref 36.0–46.0)
Hemoglobin: 12.5 g/dL (ref 12.0–15.0)
Lymphocytes Relative: 25 % (ref 12–46)
Lymphs Abs: 2.1 10*3/uL (ref 0.7–4.0)
MCH: 29.1 pg (ref 26.0–34.0)
MCHC: 35.1 g/dL (ref 30.0–36.0)
MCV: 82.8 fL (ref 78.0–100.0)
Monocytes Absolute: 0.6 10*3/uL (ref 0.1–1.0)
Monocytes Relative: 7 % (ref 3–12)
NEUTROS PCT: 67 % (ref 43–77)
Neutro Abs: 5.8 10*3/uL (ref 1.7–7.7)
PLATELETS: 195 10*3/uL (ref 150–400)
RBC: 4.3 MIL/uL (ref 3.87–5.11)
RDW: 13.8 % (ref 11.5–15.5)
WBC: 8.6 10*3/uL (ref 4.0–10.5)

## 2013-11-26 LAB — COMPREHENSIVE METABOLIC PANEL
ALBUMIN: 3 g/dL — AB (ref 3.5–5.2)
ALK PHOS: 50 U/L (ref 39–117)
ALT: 9 U/L (ref 0–35)
AST: 15 U/L (ref 0–37)
Anion gap: 12 (ref 5–15)
BUN: 7 mg/dL (ref 6–23)
CHLORIDE: 101 meq/L (ref 96–112)
CO2: 22 mEq/L (ref 19–32)
Calcium: 9.3 mg/dL (ref 8.4–10.5)
Creatinine, Ser: 0.44 mg/dL — ABNORMAL LOW (ref 0.50–1.10)
GFR calc non Af Amer: >90 mL/min (ref >90)
GLUCOSE: 95 mg/dL (ref 70–99)
POTASSIUM: 3.9 meq/L (ref 3.7–5.3)
SODIUM: 135 meq/L — AB (ref 137–147)
TOTAL PROTEIN: 6.6 g/dL (ref 6.0–8.3)
Total Bilirubin: <0.2 mg/dL — ABNORMAL LOW (ref 0.3–1.2)

## 2013-11-26 LAB — CBG MONITORING, ED: GLUCOSE-CAPILLARY: 88 mg/dL (ref 70–99)

## 2013-11-26 LAB — LIPASE, BLOOD: Lipase: 24 U/L (ref 11–59)

## 2013-11-26 MED ORDER — ONDANSETRON HCL 4 MG PO TABS: 4.0000 mg | ORAL_TABLET | Freq: Three times a day (TID) | ORAL | Status: DC | PRN

## 2013-11-26 MED ORDER — ACETAMINOPHEN 500 MG PO TABS
1000.0000 mg | ORAL_TABLET | Freq: Once | ORAL | Status: AC
  Administered 2013-11-26: 1000 mg via ORAL
  Filled 2013-11-26: qty 2

## 2013-11-26 MED ORDER — DEXTROSE 5 % IV BOLUS
250.0000 mL | Freq: Once | INTRAVENOUS | Status: AC
  Administered 2013-11-26: 250 mL via INTRAVENOUS

## 2013-11-26 MED ORDER — ONDANSETRON HCL 4 MG/2ML IJ SOLN
4.0000 mg | Freq: Once | INTRAMUSCULAR | Status: AC
  Administered 2013-11-26: 4 mg via INTRAVENOUS
  Filled 2013-11-26: qty 2

## 2013-11-26 MED ORDER — SODIUM CHLORIDE 0.9 % IV BOLUS (SEPSIS)
1000.0000 mL | Freq: Once | INTRAVENOUS | Status: AC
  Administered 2013-11-26: 1000 mL via INTRAVENOUS

## 2013-11-26 NOTE — ED Provider Notes (Signed)
Medical screening examination/treatment/procedure(s) were performed by non-physician practitioner and as supervising physician I was immediately available for consultation/collaboration.   EKG Interpretation None       Juliet RudeNathan R. Rubin PayorPickering, MD 11/26/13 1013

## 2013-11-26 NOTE — ED Notes (Signed)
Vomiting blood x 1 week. Worse tonite. Pt. Is [redacted] weeks pregnant. C/o left sided h/a.

## 2013-11-26 NOTE — ED Provider Notes (Signed)
CSN: [id number]     Arrival date & time 11/26/13  [id number] History   First MD Initiated Contact with Patient 11/26/13 [phone]     Chief Complaint  Patient presents with  . Hematemesis     (Consider location/radiation/quality/duration/timing/severity/associated sxs/prior Treatment) HPI Traci [name] Is a 23 year old G3P2002 presents the emergency department chief complaint of hematemesis and headache. She has a past medical history of gestational diabetes and preeclampsia. Patient states that she has had constant nausea since the onset of her pregnancy. She is not taking her prescribed Phenergan because her mother-in-law and fiance told her that she should not do to side effects on the baby. Patient complains of constant nausea. She vomited several times daily. She states that throughout the week she had some streaky blood in her vomitus however today she noticed some small clots. She denies any other abdominal symptoms. Patient also complains of a left-sided headache which has been intermittent and ongoing since the beginning of her pregnancy. She denies any history of migraine headaches. She describes the headache as left-sided, throbbing. She notes some intermittent blurry vision. She denies any other focal neurologic complaints. Patient denies any vaginal symptoms such as bleeding or fluid in the vaginal. Denies fevers, chills, myalgias, arthralgias. Denies DOE, SOB, chest tightness or pressure, radiation to left arm, jaw or back, or diaphoresis. Denies dysuria, flank pain, suprapubic pain, frequency, urgency, or hematuria. D  Past Medical History  Diagnosis Date  . Pregnancy induced hypertension   . Hernia    History reviewed. No pertinent past surgical history. Family History  Problem Relation Age of Onset  . Diabetes Maternal Grandmother    History  Substance Use Topics  . Smoking status: Never Smoker   . Smokeless tobacco: Never Used  . Alcohol Use: No   OB History   Grav Para  Term Preterm Abortions TAB SAB Ect Mult Living   3 2 2       2      Review of Systems  Ten systems reviewed and are negative for acute change, except as noted in the HPI.    Allergies  Review of patient's allergies indicates no known allergies.  Home Medications   Prior to Admission medications   Medication Sig Start Date End Date Taking? Authorizing Provider  aspirin EC 81 MG tablet Take 1 tablet (81 mg total) by mouth daily. 11/18/13   Tereso NewcomerUgonna A Anyanwu, MD  Prenatal Vit-Fe Fumarate-FA (PRENATAL MULTIVITAMIN) TABS tablet Take 1 tablet by mouth daily. 11/18/13   Tereso NewcomerUgonna A Anyanwu, MD  promethazine (PHENERGAN) 25 MG tablet Take 1 tablet (25 mg total) by mouth every 6 (six) hours as needed for nausea or vomiting. 10/26/13   Reva Boresanya S Pratt, MD   BP 120/74  Pulse 68  Temp(Src) 98.4 F (36.9 C) (Oral)  Resp 18  Ht 5\' 3"  (1.6 m)  Wt 152 lb (68.947 kg)  BMI 26.93 kg/m2  SpO2 98%  LMP 08/06/2013 Physical Exam  Nursing note and vitals reviewed. Constitutional: She is oriented to person, place, and time. She appears well-developed and well-nourished. No distress.  HENT:  Head: Normocephalic and atraumatic.  Eyes: Conjunctivae and EOM are normal. Pupils are equal, round, and reactive to light. No scleral icterus.  Neck: Normal range of motion.  Cardiovascular: Normal rate, regular rhythm and normal heart sounds.  Exam reveals no gallop and no friction rub.   No murmur heard. Pulmonary/Chest: Effort normal and breath sounds normal. No respiratory distress.  Abdominal: Soft. Bowel sounds are normal.  She exhibits distension (gravid abdomen. large ventral and umbilical hernia). She exhibits no mass. There is no tenderness. There is no guarding.  Neurological: She is alert and oriented to person, place, and time.  Speech is clear and goal oriented, follows commands Major Cranial nerves without deficit, no facial droop Normal strength in upper and lower extremities bilaterally including  dorsiflexion and plantar flexion, strong and equal grip strength Sensation normal to light and sharp touch Moves extremities without ataxia, coordination intact Normal finger to nose and rapid alternating movements Neg romberg, no pronator drift Normal gait Normal heel-shin and balance   Skin: Skin is warm and dry. She is not diaphoretic.  Psychiatric: Thought content normal.    ED Course  Procedures (including critical care time) Labs Review Labs Reviewed  CBC WITH DIFFERENTIAL  COMPREHENSIVE METABOLIC PANEL  URINALYSIS, ROUTINE W REFLEX MICROSCOPIC  LIPASE, BLOOD  CBG MONITORING, ED    Imaging Review No results found.   EKG Interpretation None      MDM   Final diagnoses:  None    BP 115/82  Pulse 89  Temp(Src) 98.4 F (36.9 C) (Oral)  Resp 18  Ht 5\' 3"  (1.6 m)  Wt 152 lb (68.947 kg)  BMI 26.93 kg/m2  SpO2 100%  LMP 08/06/2013 Patient with c/o hematemesis. Visual blurring. She is not taking her medications for nausea.   7:59 AM BP 106/67  Pulse 70  Temp(Src) 98.4 F (36.9 C) (Oral)  Resp 18  Ht 5\' 3"  (1.6 m)  Wt 152 lb (68.947 kg)  BMI 26.93 kg/m2  SpO2 100%  LMP 08/06/2013 Patient is feeling much better after zofran. She is getting small amount of IV dextrose for her nausea. Eating solids and drinking fluids. Awaiting fetal heart tones.   9:00 AM BP 111/60  Pulse 92  Temp(Src) 98.4 F (36.9 C) (Oral)  Resp 18  Ht 5\' 3"  (1.6 m)  Wt 152 lb (68.947 kg)  BMI 26.93 kg/m2  SpO2 98%  LMP 08/06/2013  fht 130. WNL. DISCHARGE.  F/U at [facility].  return precautions discussed.  Arthor Captain, PA-C 11/26/13 0900

## 2013-11-26 NOTE — Telephone Encounter (Signed)
Patient called regarding increased vomiting and she has been seeing blood in her vomit, not a lot of blood and it is stringy looking.  Call a nurse recommended she be seen at the emergency room [facility]  They gave her some zofran and reassured her and advised her to call us to follow up.  She already has an appointment tomorrow for a three hour.  We will have her come in and if she is not vomiting we will proceed with the three hour.  I also advised her of her AFP quad screen results that are positive for increased risk of downs syndrome.  She understands that she will need to see the genetic counselor and we will discuss in the morning if she would like to proceed with the panarama testing that we have available in the office.

## 2013-11-26 NOTE — Discharge Instructions (Signed)
Hematemesis This condition is the vomiting of blood. CAUSES  This can happen if you have a peptic ulcer or an irritation of the throat, stomach, or small bowel. Vomiting over and over again or swallowing blood from a nosebleed, coughing or facial injury can also result in bloody vomit. Anti-inflammatory pain medicines are a common cause of this potentially dangerous condition. The most serious causes of vomiting blood include:  Ulcers (a bacteria called H. pylori is common cause of ulcers).  Clotting problems.  Alcoholism.  Cirrhosis. TREATMENT  Treatment depends on the cause and the severity of the bleeding. Small amounts of blood streaks in the vomit is not the same as vomiting large amounts of bloody or dark, coffee grounds-like material. Weakness, fainting, dehydration, anemia, and continued alcohol or drug use increase the risk. Examination may include blood, vomit, or stool tests. The presence of bloody or dark stool that tests positive for blood (Hemoccult) means the bleeding has been going on for some time. Endoscopy and imaging studies may be done. Emergency treatment may include:  IV medicines or fluids.  Blood transfusions.  Surgery. Hospital care is required for high risk patients or when IV fluids or blood is needed. Upper GI bleeding can cause shock and death if not controlled. HOME CARE INSTRUCTIONS   Your treatment does not require hospital care at this time.  Remain at rest until your condition improves.  Drink clear liquids as tolerated.  Avoid:  Alcohol.  Nicotine.  Aspirin.  Any other anti-inflammatory medicine (ibuprofen, naproxen, and many others).  Medications to suppress stomach acid or vomiting may be needed. Take all your medicine as prescribed.  Be sure to see your caregiver for follow-up as recommended. SEEK IMMEDIATE MEDICAL CARE IF:   You have repeated vomiting, dehydration, fainting, or extreme weakness.  You are vomiting large amounts of  bloody or dark material.  You pass large, dark or bloody stools. Document Released: 05/03/2004 Document Revised: 06/18/2011 Document Reviewed: 05/19/2008 [facility] Patient Information 2015 [place], [place]. This information is not intended to replace advice given to you by your health care provider. Make sure you discuss any questions you have with your health care provider.  Mallory-Weiss Syndrome Mallory-Weiss syndrome refers to bleeding from tears in the lining of the esophagus near where it meets the stomach. This is often caused by forceful vomiting, retching or coughing. This condition is often associated with alcoholism. Usually the bleeding stops by itself after 24 to 48 hours. Sometimes endoscopic or surgical treatment is needed. This condition is not usually fatal. SYMPTOMS  Vomiting of bright red or black coffee ground like material.  Black, tarry stools.  Low blood pressure causing you to feel faint or experience loss of consciousness. DIAGNOSIS  Definitive diagnosis is by endoscopy. Treatment is usually supportive. Persistent bleeding is uncommon. Sometimes cauterization or injection of epinephrine to stop the bleeding is used during the diagnostic endoscopy. Embolization (obstruction) of the arteries supplying the area of bleeding is sometimes used to stop the bleeding.  An NG tube (naso-gastric tube) may be inserted to determine where the bleeding is coming from.  Often an EGD (esophagogastroduodenoscopy) is done. In this procedure there is a small flexible tube-like telescope (endoscope) put into your mouth, through your esophagus (the food tube leading from your mouth to your stomach), down into your stomach and into the small bowel. Through this your caregiver can see what and where the problem is. TREATMENT  It is necessary to stop the bleeding as soon as possible. During  the EGD, your caregiver may inject medication into bleeding vessels to clot them.  SEEK IMMEDIATE MEDICAL  CARE IF:  You have persistent dizziness, lightheadedness, or fainting.  Your vomiting returns and you have blood in your stools.  You have vomit that is bright red blood or black coffee ground-like blood, bright red blood in the stool or black tarry stools.  You have chest pain.  You cannot eat or drink.  You have nausea or vomiting. MAKE SURE YOU:   Understand these instructions.  Will watch your condition.  Will get help right away if you are not doing well or get worse. Document Released: 08/13/2005 Document Revised: 06/18/2011 Document Reviewed: 05/20/2013 [facility] Patient Information 2015 [place]. This information is not intended to replace advice given to you by your health care provider. Make sure you discuss any questions you have with your health care provider.

## 2013-11-26 NOTE — ED Notes (Signed)
CBG 88 

## 2013-11-27 ENCOUNTER — Other Ambulatory Visit: Payer: Self-pay

## 2013-11-27 ENCOUNTER — Encounter: Payer: Self-pay | Admitting: Family Medicine

## 2013-11-27 ENCOUNTER — Other Ambulatory Visit: Payer: Medicaid Other | Admitting: Family Medicine

## 2013-11-27 ENCOUNTER — Ambulatory Visit (INDEPENDENT_AMBULATORY_CARE_PROVIDER_SITE_OTHER): Payer: Medicaid Other | Admitting: Family Medicine

## 2013-11-27 DIAGNOSIS — O09299 Supervision of pregnancy with other poor reproductive or obstetric history, unspecified trimester: Secondary | ICD-10-CM

## 2013-11-27 DIAGNOSIS — Z8632 Personal history of gestational diabetes: Secondary | ICD-10-CM

## 2013-11-27 DIAGNOSIS — O09292 Supervision of pregnancy with other poor reproductive or obstetric history, second trimester: Secondary | ICD-10-CM

## 2013-11-27 DIAGNOSIS — Z3480 Encounter for supervision of other normal pregnancy, unspecified trimester: Secondary | ICD-10-CM

## 2013-11-27 DIAGNOSIS — Z3482 Encounter for supervision of other normal pregnancy, second trimester: Secondary | ICD-10-CM

## 2013-11-27 NOTE — Patient Instructions (Signed)
Second Trimester of Pregnancy The second trimester is from week 13 through week 28, months 4 through 6. The second trimester is often a time when you feel your best. Your body has also adjusted to being pregnant, and you begin to feel better physically. Usually, morning sickness has lessened or quit completely, you may have more energy, and you may have an increase in appetite. The second trimester is also a time when the fetus is growing rapidly. At the end of the sixth month, the fetus is about 9 inches long and weighs about 1 pounds. You will likely begin to feel the baby move (quickening) between 18 and 20 weeks of the pregnancy. BODY CHANGES Your body goes through many changes during pregnancy. The changes vary from woman to woman.   Your weight will continue to increase. You will notice your lower abdomen bulging out.  You may begin to get stretch marks on your hips, abdomen, and breasts.  You may develop headaches that can be relieved by medicines approved by your health care provider.  You may urinate more often because the fetus is pressing on your bladder.  You may develop or continue to have heartburn as a result of your pregnancy.  You may develop constipation because certain hormones are causing the muscles that push waste through your intestines to slow down.  You may develop hemorrhoids or swollen, bulging veins (varicose veins).  You may have back pain because of the weight gain and pregnancy hormones relaxing your joints between the bones in your pelvis and as a result of a shift in weight and the muscles that support your balance.  Your breasts will continue to grow and be tender.  Your gums may bleed and may be sensitive to brushing and flossing.  Dark spots or blotches (chloasma, mask of pregnancy) may develop on your face. This will likely fade after the baby is born.  A dark line from your belly button to the pubic area (linea nigra) may appear. This will likely  fade after the baby is born.  You may have changes in your hair. These can include thickening of your hair, rapid growth, and changes in texture. Some women also have hair loss during or after pregnancy, or hair that feels dry or thin. Your hair will most likely return to normal after your baby is born. WHAT TO EXPECT AT YOUR PRENATAL VISITS During a routine prenatal visit:  You will be weighed to make sure you and the fetus are growing normally.  Your blood pressure will be taken.  Your abdomen will be measured to track your baby's growth.  The fetal heartbeat will be listened to.  Any test results from the previous visit will be discussed. Your health care provider may ask you:  How you are feeling.  If you are feeling the baby move.  If you have had any abnormal symptoms, such as leaking fluid, bleeding, severe headaches, or abdominal cramping.  If you have any questions. Other tests that may be performed during your second trimester include:  Blood tests that check for:  Low iron levels (anemia).  Gestational diabetes (between 24 and 28 weeks).  Rh antibodies.  Urine tests to check for infections, diabetes, or protein in the urine.  An ultrasound to confirm the proper growth and development of the baby.  An amniocentesis to check for possible genetic problems.  Fetal screens for spina bifida and Down syndrome. HOME CARE INSTRUCTIONS   Avoid all smoking, herbs, alcohol, and unprescribed   drugs. These chemicals affect the formation and growth of the baby.  Follow your health care provider's instructions regarding medicine use. There are medicines that are either safe or unsafe to take during pregnancy.  Exercise only as directed by your health care provider. Experiencing uterine cramps is a good sign to stop exercising.  Continue to eat regular, healthy meals.  Wear a good support bra for breast tenderness.  Do not use hot tubs, steam rooms, or saunas.  Wear  your seat belt at all times when driving.  Avoid raw meat, uncooked cheese, cat litter boxes, and soil used by cats. These carry germs that can cause birth defects in the baby.  Take your prenatal vitamins.  Try taking a stool softener (if your health care provider approves) if you develop constipation. Eat more high-fiber foods, such as fresh vegetables or fruit and whole grains. Drink plenty of fluids to keep your urine clear or pale yellow.  Take warm sitz baths to soothe any pain or discomfort caused by hemorrhoids. Use hemorrhoid cream if your health care provider approves.  If you develop varicose veins, wear support hose. Elevate your feet for 15 minutes, 3-4 times a day. Limit salt in your diet.  Avoid heavy lifting, wear low heel shoes, and practice good posture.  Rest with your legs elevated if you have leg cramps or low back pain.  Visit your dentist if you have not gone yet during your pregnancy. Use a soft toothbrush to brush your teeth and be gentle when you floss.  A sexual relationship may be continued unless your health care provider directs you otherwise.  Continue to go to all your prenatal visits as directed by your health care provider. SEEK MEDICAL CARE IF:   You have dizziness.  You have mild pelvic cramps, pelvic pressure, or nagging pain in the abdominal area.  You have persistent nausea, vomiting, or diarrhea.  You have a bad smelling vaginal discharge.  You have pain with urination. SEEK IMMEDIATE MEDICAL CARE IF:   You have a fever.  You are leaking fluid from your vagina.  You have spotting or bleeding from your vagina.  You have severe abdominal cramping or pain.  You have rapid weight gain or loss.  You have shortness of breath with chest pain.  You notice sudden or extreme swelling of your face, hands, ankles, feet, or legs.  You have not felt your baby move in over an hour.  You have severe headaches that do not go away with  medicine.  You have vision changes. Document Released: 03/20/2001 Document Revised: 03/31/2013 Document Reviewed: 05/27/2012 [employer] Patient Information 2015 [employer]. This information is not intended to replace advice given to you by your health care provider. Make sure you discuss any questions you have with your health care provider.  Breastfeeding Deciding to breastfeed is one of the best choices you can make for you and your baby. A change in hormones during pregnancy causes your breast tissue to grow and increases the number and size of your milk ducts. These hormones also allow proteins, sugars, and fats from your blood supply to make breast milk in your milk-producing glands. Hormones prevent breast milk from being released before your baby is born as well as prompt milk flow after birth. Once breastfeeding has begun, thoughts of your baby, as well as his or her sucking or crying, can stimulate the release of milk from your milk-producing glands.  BENEFITS OF BREASTFEEDING For Your Baby  Your first   milk (colostrum) helps your baby's digestive system function better.   There are antibodies in your milk that help your baby fight off infections.   Your baby has a lower incidence of asthma, allergies, and sudden infant death syndrome.   The nutrients in breast milk are better for your baby than infant formulas and are designed uniquely for your baby's needs.   Breast milk improves your baby's brain development.   Your baby is less likely to develop other conditions, such as childhood obesity, asthma, or type 2 diabetes mellitus.  For You   Breastfeeding helps to create a very special bond between you and your baby.   Breastfeeding is convenient. Breast milk is always available at the correct temperature and costs nothing.   Breastfeeding helps to burn calories and helps you lose the weight gained during pregnancy.   Breastfeeding makes your uterus contract to its  prepregnancy size faster and slows bleeding (lochia) after you give birth.   Breastfeeding helps to lower your risk of developing type 2 diabetes mellitus, osteoporosis, and breast or ovarian cancer later in life. SIGNS THAT YOUR BABY IS HUNGRY Early Signs of Hunger  Increased alertness or activity.  Stretching.  Movement of the head from side to side.  Movement of the head and opening of the mouth when the corner of the mouth or cheek is stroked (rooting).  Increased sucking sounds, smacking lips, cooing, sighing, or squeaking.  Hand-to-mouth movements.  Increased sucking of fingers or hands. Late Signs of Hunger  Fussing.  Intermittent crying. Extreme Signs of Hunger Signs of extreme hunger will require calming and consoling before your baby will be able to breastfeed successfully. Do not wait for the following signs of extreme hunger to occur before you initiate breastfeeding:   Restlessness.  A loud, strong cry.   Screaming. BREASTFEEDING BASICS Breastfeeding Initiation  Find a comfortable place to sit or lie down, with your neck and back well supported.  Place a pillow or rolled up blanket under your baby to bring him or her to the level of your breast (if you are seated). Nursing pillows are specially designed to help support your arms and your baby while you breastfeed.  Make sure that your baby's abdomen is facing your abdomen.   Gently massage your breast. With your fingertips, massage from your chest wall toward your nipple in a circular motion. This encourages milk flow. You may need to continue this action during the feeding if your milk flows slowly.  Support your breast with 4 fingers underneath and your thumb above your nipple. Make sure your fingers are well away from your nipple and your baby's mouth.   Stroke your baby's lips gently with your finger or nipple.   When your baby's mouth is open wide enough, quickly bring your baby to your breast,  placing your entire nipple and as much of the colored area around your nipple (areola) as possible into your baby's mouth.   More areola should be visible above your baby's upper lip than below the lower lip.   Your baby's tongue should be between his or her lower gum and your breast.   Ensure that your baby's mouth is correctly positioned around your nipple (latched). Your baby's lips should create a seal on your breast and be turned out (everted).  It is common for your baby to suck about 2-3 minutes in order to start the flow of breast milk. Latching Teaching your baby how to latch on to your breast   properly is very important. An improper latch can cause nipple pain and decreased milk supply for you and poor weight gain in your baby. Also, if your baby is not latched onto your nipple properly, he or she may swallow some air during feeding. This can make your baby fussy. Burping your baby when you switch breasts during the feeding can help to get rid of the air. However, teaching your baby to latch on properly is still the best way to prevent fussiness from swallowing air while breastfeeding. Signs that your baby has successfully latched on to your nipple:    Silent tugging or silent sucking, without causing you pain.   Swallowing heard between every 3-4 sucks.    Muscle movement above and in front of his or her ears while sucking.  Signs that your baby has not successfully latched on to nipple:   Sucking sounds or smacking sounds from your baby while breastfeeding.  Nipple pain. If you think your baby has not latched on correctly, slip your finger into the corner of your baby's mouth to break the suction and place it between your baby's gums. Attempt breastfeeding initiation again. Signs of Successful Breastfeeding Signs from your baby:   A gradual decrease in the number of sucks or complete cessation of sucking.   Falling asleep.   Relaxation of his or her body.    Retention of a small amount of milk in his or her mouth.   Letting go of your breast by himself or herself. Signs from you:  Breasts that have increased in firmness, weight, and size 1-3 hours after feeding.   Breasts that are softer immediately after breastfeeding.  Increased milk volume, as well as a change in milk consistency and color by the fifth day of breastfeeding.   Nipples that are not sore, cracked, or bleeding. Signs That Your Baby is Getting Enough Milk  Wetting at least 3 diapers in a 24-hour period. The urine should be clear and pale yellow by age 5 days.  At least 3 stools in a 24-hour period by age 5 days. The stool should be soft and yellow.  At least 3 stools in a 24-hour period by age 7 days. The stool should be seedy and yellow.  No loss of weight greater than 10% of birth weight during the first 3 days of age.  Average weight gain of 4-7 ounces (113-198 g) per week after age 4 days.  Consistent daily weight gain by age 5 days, without weight loss after the age of 2 weeks. After a feeding, your baby may spit up a small amount. This is common. BREASTFEEDING FREQUENCY AND DURATION Frequent feeding will help you make more milk and can prevent sore nipples and breast engorgement. Breastfeed when you feel the need to reduce the fullness of your breasts or when your baby shows signs of hunger. This is called "breastfeeding on demand." Avoid introducing a pacifier to your baby while you are working to establish breastfeeding (the first 4-6 weeks after your baby is born). After this time you may choose to use a pacifier. Research has shown that pacifier use during the first year of a baby's life decreases the risk of sudden infant death syndrome (SIDS). Allow your baby to feed on each breast as long as he or she wants. Breastfeed until your baby is finished feeding. When your baby unlatches or falls asleep while feeding from the first breast, offer the second breast.  Because newborns are often sleepy in the   first few weeks of life, you may need to awaken your baby to get him or her to feed. Breastfeeding times will vary from baby to baby. However, the following rules can serve as a guide to help you ensure that your baby is properly fed:  Newborns (babies 4 weeks of age or younger) may breastfeed every 1-3 hours.  Newborns should not go longer than 3 hours during the day or 5 hours during the night without breastfeeding.  You should breastfeed your baby a minimum of 8 times in a 24-hour period until you begin to introduce solid foods to your baby at around 6 months of age. BREAST MILK PUMPING Pumping and storing breast milk allows you to ensure that your baby is exclusively fed your breast milk, even at times when you are unable to breastfeed. This is especially important if you are going back to work while you are still breastfeeding or when you are not able to be present during feedings. Your lactation consultant can give you guidelines on how long it is safe to store breast milk.  A breast pump is a machine that allows you to pump milk from your breast into a sterile bottle. The pumped breast milk can then be stored in a refrigerator or freezer. Some breast pumps are operated by hand, while others use electricity. Ask your lactation consultant which type will work best for you. Breast pumps can be purchased, but some hospitals and breastfeeding support groups lease breast pumps on a monthly basis. A lactation consultant can teach you how to hand express breast milk, if you prefer not to use a pump.  CARING FOR YOUR BREASTS WHILE YOU BREASTFEED Nipples can become dry, cracked, and sore while breastfeeding. The following recommendations can help keep your breasts moisturized and healthy:  Avoid using soap on your nipples.   Wear a supportive bra. Although not required, special nursing bras and tank tops are designed to allow access to your breasts for  breastfeeding without taking off your entire bra or top. Avoid wearing underwire-style bras or extremely tight bras.  Air dry your nipples for 3-4minutes after each feeding.   Use only cotton bra pads to absorb leaked breast milk. Leaking of breast milk between feedings is normal.   Use lanolin on your nipples after breastfeeding. Lanolin helps to maintain your skin's normal moisture barrier. If you use pure lanolin, you do not need to wash it off before feeding your baby again. Pure lanolin is not toxic to your baby. You may also hand express a few drops of breast milk and gently massage that milk into your nipples and allow the milk to air dry. In the first few weeks after giving birth, some women experience extremely full breasts (engorgement). Engorgement can make your breasts feel heavy, warm, and tender to the touch. Engorgement peaks within 3-5 days after you give birth. The following recommendations can help ease engorgement:  Completely empty your breasts while breastfeeding or pumping. You may want to start by applying warm, moist heat (in the shower or with warm water-soaked hand towels) just before feeding or pumping. This increases circulation and helps the milk flow. If your baby does not completely empty your breasts while breastfeeding, pump any extra milk after he or she is finished.  Wear a snug bra (nursing or regular) or tank top for 1-2 days to signal your body to slightly decrease milk production.  Apply ice packs to your breasts, unless this is too uncomfortable for you.    Make sure that your baby is latched on and positioned properly while breastfeeding. If engorgement persists after 48 hours of following these recommendations, contact your health care provider or a lactation consultant. OVERALL HEALTH CARE RECOMMENDATIONS WHILE BREASTFEEDING  Eat healthy foods. Alternate between meals and snacks, eating 3 of each per day. Because what you eat affects your breast milk,  some of the foods may make your baby more irritable than usual. Avoid eating these foods if you are sure that they are negatively affecting your baby.  Drink milk, fruit juice, and water to satisfy your thirst (about 10 glasses a day).   Rest often, relax, and continue to take your prenatal vitamins to prevent fatigue, stress, and anemia.  Continue breast self-awareness checks.  Avoid chewing and smoking tobacco.  Avoid alcohol and drug use. Some medicines that may be harmful to your baby can pass through breast milk. It is important to ask your health care provider before taking any medicine, including all over-the-counter and prescription medicine as well as vitamin and herbal supplements. It is possible to become pregnant while breastfeeding. If birth control is desired, ask your health care provider about options that will be safe for your baby. SEEK MEDICAL CARE IF:   You feel like you want to stop breastfeeding or have become frustrated with breastfeeding.  You have painful breasts or nipples.  Your nipples are cracked or bleeding.  Your breasts are red, tender, or warm.  You have a swollen area on either breast.  You have a fever or chills.  You have nausea or vomiting.  You have drainage other than breast milk from your nipples.  Your breasts do not become full before feedings by the fifth day after you give birth.  You feel sad and depressed.  Your baby is too sleepy to eat well.  Your baby is having trouble sleeping.   Your baby is wetting less than 3 diapers in a 24-hour period.  Your baby has less than 3 stools in a 24-hour period.  Your baby's skin or the white part of his or her eyes becomes yellow.   Your baby is not gaining weight by 5 days of age. SEEK IMMEDIATE MEDICAL CARE IF:   Your baby is overly tired (lethargic) and does not want to wake up and feed.  Your baby develops an unexplained fever. Document Released: 03/26/2005 Document Revised:  03/31/2013 Document Reviewed: 09/17/2012 [employer] Patient Information 2015 [employer]. This information is not intended to replace advice given to you by your health care provider. Make sure you discuss any questions you have with your health care provider.  

## 2013-11-27 NOTE — Progress Notes (Signed)
Still having nausea/emesis--seen in ED this week. On Zofran--helps some Vomited 3 hour today--will check fasting and Hgb A1C For Panorama today

## 2013-11-27 NOTE — Progress Notes (Signed)
Pt here today for her 3 hr GTT and Panorama testing.

## 2013-11-27 NOTE — Addendum Note (Signed)
Addended by: Barbara CowerNOGUES, Idella Lamontagne L on: 11/27/2013 09:05 AM   Modules accepted: Orders

## 2013-11-28 LAB — HEMOGLOBIN A1C
HEMOGLOBIN A1C: 5.6 % (ref <5.7)
Mean Plasma Glucose: 114 mg/dL (ref <117)

## 2013-11-28 LAB — GLUCOSE, FASTING: GLUCOSE, FASTING: 84 mg/dL (ref 70–99)

## 2013-11-30 ENCOUNTER — Telehealth: Payer: Self-pay | Admitting: *Deleted

## 2013-11-30 ENCOUNTER — Encounter: Payer: Self-pay | Admitting: Family Medicine

## 2013-11-30 NOTE — Telephone Encounter (Signed)
Patient is calling to reschedule her three hour glucose test and to check on her lab results.  Discussed lab results and we are still awaiting panorama results.  Three hour test is scheduled for Thursday August 27th.

## 2013-12-03 ENCOUNTER — Other Ambulatory Visit: Payer: Medicaid Other | Admitting: *Deleted

## 2013-12-03 DIAGNOSIS — R7309 Other abnormal glucose: Secondary | ICD-10-CM

## 2013-12-03 MED ORDER — ACCU-CHEK FASTCLIX LANCET KIT: 1.0000 | PACK | Freq: Four times a day (QID) | Status: DC

## 2013-12-03 MED ORDER — ACCU-CHEK NANO SMARTVIEW W/DEVICE KIT: 1.0000 | PACK | Freq: Four times a day (QID) | Status: DC

## 2013-12-03 MED ORDER — GLUCOSE BLOOD VI STRP: ORAL_STRIP | Status: DC

## 2013-12-03 MED ORDER — ONETOUCH ULTRA SYSTEM W/DEVICE KIT: PACK | Status: DC

## 2013-12-03 NOTE — Progress Notes (Signed)
Patient is unable to keep glucose drink down.  She vomited within 15 minutes of drinking the glucola.  Per Dr. Jolayne Panther we will call in a glucose meter and testing supplies for patient to check her blood sugar four times daily and bring this information to her next appointment.  She has used a glucose monitor in the past and is familiar with sticking her finger with the lancets that will be provided.  She will call the office if she has any questions or concerns.

## 2013-12-07 ENCOUNTER — Telehealth: Payer: Self-pay | Admitting: *Deleted

## 2013-12-07 NOTE — Telephone Encounter (Signed)
Attempted to call patient to notify her of Low Risk panorama results.  Her VM box was not set up so I was unable to leave a message.

## 2013-12-10 ENCOUNTER — Ambulatory Visit (HOSPITAL_COMMUNITY)
Admission: RE | Admit: 2013-12-10 | Discharge: 2013-12-10 | Disposition: A | Discharge status: Home / Self Care | Payer: Medicaid Other | Source: Ambulatory Visit | Attending: Obstetrics & Gynecology | Admitting: Obstetrics & Gynecology

## 2013-12-10 DIAGNOSIS — O280 Abnormal hematological finding on antenatal screening of mother: Secondary | ICD-10-CM

## 2013-12-10 DIAGNOSIS — Z8632 Personal history of gestational diabetes: Secondary | ICD-10-CM

## 2013-12-10 DIAGNOSIS — O289 Unspecified abnormal findings on antenatal screening of mother: Secondary | ICD-10-CM | POA: Insufficient documentation

## 2013-12-10 DIAGNOSIS — Z3482 Encounter for supervision of other normal pregnancy, second trimester: Secondary | ICD-10-CM

## 2013-12-10 DIAGNOSIS — O09299 Supervision of pregnancy with other poor reproductive or obstetric history, unspecified trimester: Secondary | ICD-10-CM | POA: Insufficient documentation

## 2013-12-10 DIAGNOSIS — O09292 Supervision of pregnancy with other poor reproductive or obstetric history, second trimester: Secondary | ICD-10-CM

## 2013-12-11 ENCOUNTER — Telehealth (HOSPITAL_COMMUNITY): Payer: Self-pay | Admitting: MS"

## 2013-12-11 NOTE — Telephone Encounter (Signed)
Attempted to call patient to discuss option of genetic counseling in context of abnormal Quad screen, but patient also has normal NIPS. Unable to leave voicemail.   Traci [name] 12/11/2013 11:59 AM

## 2013-12-15 ENCOUNTER — Encounter: Payer: Self-pay | Admitting: Obstetrics & Gynecology

## 2013-12-17 ENCOUNTER — Encounter: Payer: Medicaid Other | Admitting: Obstetrics & Gynecology

## 2013-12-23 ENCOUNTER — Ambulatory Visit (INDEPENDENT_AMBULATORY_CARE_PROVIDER_SITE_OTHER): Payer: Medicaid Other | Admitting: Obstetrics and Gynecology

## 2013-12-23 ENCOUNTER — Encounter: Payer: Self-pay | Admitting: Obstetrics and Gynecology

## 2013-12-23 VITALS — BP 115/78 | HR 93 | Wt 157.6 lb

## 2013-12-23 DIAGNOSIS — O09299 Supervision of pregnancy with other poor reproductive or obstetric history, unspecified trimester: Secondary | ICD-10-CM

## 2013-12-23 DIAGNOSIS — Z3482 Encounter for supervision of other normal pregnancy, second trimester: Secondary | ICD-10-CM

## 2013-12-23 DIAGNOSIS — Z8632 Personal history of gestational diabetes: Secondary | ICD-10-CM

## 2013-12-23 DIAGNOSIS — O09292 Supervision of pregnancy with other poor reproductive or obstetric history, second trimester: Secondary | ICD-10-CM

## 2013-12-23 DIAGNOSIS — O280 Abnormal hematological finding on antenatal screening of mother: Secondary | ICD-10-CM

## 2013-12-23 DIAGNOSIS — Z3480 Encounter for supervision of other normal pregnancy, unspecified trimester: Secondary | ICD-10-CM

## 2013-12-23 DIAGNOSIS — O289 Unspecified abnormal findings on antenatal screening of mother: Secondary | ICD-10-CM

## 2013-12-23 MED ORDER — NYSTATIN 100000 UNIT/GM EX CREA: 1.0000 "application " | TOPICAL_CREAM | Freq: Two times a day (BID) | CUTANEOUS | Status: DC

## 2013-12-23 NOTE — Progress Notes (Signed)
Patient is doing well without complaints. Patient has not been checking CBGs appropriately (fasting are not true fasting and not checking 2 hours after meals). Patient often eating in the middle of the night. Patient already with TWG 24lb. Discussed portion control of nutrient rich food instead of empty calories. Discussed exercise in pregnancy 30 minutes of walking daily.

## 2014-01-18 ENCOUNTER — Encounter: Payer: Medicaid Other | Admitting: Obstetrics & Gynecology

## 2014-01-29 ENCOUNTER — Encounter: Payer: Medicaid Other | Admitting: Obstetrics & Gynecology

## 2014-02-01 ENCOUNTER — Ambulatory Visit (INDEPENDENT_AMBULATORY_CARE_PROVIDER_SITE_OTHER): Payer: Medicaid Other | Admitting: Family Medicine

## 2014-02-01 VITALS — BP 129/81 | HR 79 | Wt 170.0 lb

## 2014-02-01 DIAGNOSIS — O09292 Supervision of pregnancy with other poor reproductive or obstetric history, second trimester: Secondary | ICD-10-CM

## 2014-02-01 DIAGNOSIS — Z23 Encounter for immunization: Secondary | ICD-10-CM

## 2014-02-01 DIAGNOSIS — Z3482 Encounter for supervision of other normal pregnancy, second trimester: Secondary | ICD-10-CM

## 2014-02-01 DIAGNOSIS — Z8632 Personal history of gestational diabetes: Principal | ICD-10-CM

## 2014-02-01 NOTE — Patient Instructions (Signed)
Third Trimester of Pregnancy The third trimester is from week 29 through week 42, months 7 through 9. The third trimester is a time when the fetus is growing rapidly. At the end of the ninth month, the fetus is about 20 inches in length and weighs 6-10 pounds.  BODY CHANGES Your body goes through many changes during pregnancy. The changes vary from woman to woman.   Your weight will continue to increase. You can expect to gain 25-35 pounds (11-16 kg) by the end of the pregnancy.  You may begin to get stretch marks on your hips, abdomen, and breasts.  You may urinate more often because the fetus is moving lower into your pelvis and pressing on your bladder.  You may develop or continue to have heartburn as a result of your pregnancy.  You may develop constipation because certain hormones are causing the muscles that push waste through your intestines to slow down.  You may develop hemorrhoids or swollen, bulging veins (varicose veins).  You may have pelvic pain because of the weight gain and pregnancy hormones relaxing your joints between the bones in your pelvis. Backaches may result from overexertion of the muscles supporting your posture.  You may have changes in your hair. These can include thickening of your hair, rapid growth, and changes in texture. Some women also have hair loss during or after pregnancy, or hair that feels dry or thin. Your hair will most likely return to normal after your baby is born.  Your breasts will continue to grow and be tender. A yellow discharge may leak from your breasts called colostrum.  Your belly button may stick out.  You may feel short of breath because of your expanding uterus.  You may notice the fetus "dropping," or moving lower in your abdomen.  You may have a bloody mucus discharge. This usually occurs a few days to a week before labor begins.  Your cervix becomes thin and soft (effaced) near your due date. WHAT TO EXPECT AT YOUR PRENATAL  EXAMS  You will have prenatal exams every 2 weeks until week 36. Then, you will have weekly prenatal exams. During a routine prenatal visit:  You will be weighed to make sure you and the fetus are growing normally.  Your blood pressure is taken.  Your abdomen will be measured to track your baby's growth.  The fetal heartbeat will be listened to.  Any test results from the previous visit will be discussed.  You may have a cervical check near your due date to see if you have effaced. At around 36 weeks, your caregiver will check your cervix. At the same time, your caregiver will also perform a test on the secretions of the vaginal tissue. This test is to determine if a type of bacteria, Group B streptococcus, is present. Your caregiver will explain this further. Your caregiver may ask you:  What your birth plan is.  How you are feeling.  If you are feeling the baby move.  If you have had any abnormal symptoms, such as leaking fluid, bleeding, severe headaches, or abdominal cramping.  If you have any questions. Other tests or screenings that may be performed during your third trimester include:  Blood tests that check for low iron levels (anemia).  Fetal testing to check the health, activity level, and growth of the fetus. Testing is done if you have certain medical conditions or if there are problems during the pregnancy. FALSE LABOR You may feel small, irregular contractions that   eventually go away. These are called Braxton Hicks contractions, or false labor. Contractions may last for hours, days, or even weeks before true labor sets in. If contractions come at regular intervals, intensify, or become painful, it is best to be seen by your caregiver.  SIGNS OF LABOR   Menstrual-like cramps.  Contractions that are 5 minutes apart or less.  Contractions that start on the top of the uterus and spread down to the lower abdomen and back.  A sense of increased pelvic pressure or back  pain.  A watery or bloody mucus discharge that comes from the vagina. If you have any of these signs before the 37th week of pregnancy, call your caregiver right away. You need to go to the hospital to get checked immediately. HOME CARE INSTRUCTIONS   Avoid all smoking, herbs, alcohol, and unprescribed drugs. These chemicals affect the formation and growth of the baby.  Follow your caregiver's instructions regarding medicine use. There are medicines that are either safe or unsafe to take during pregnancy.  Exercise only as directed by your caregiver. Experiencing uterine cramps is a good sign to stop exercising.  Continue to eat regular, healthy meals.  Wear a good support bra for breast tenderness.  Do not use hot tubs, steam rooms, or saunas.  Wear your seat belt at all times when driving.  Avoid raw meat, uncooked cheese, cat litter boxes, and soil used by cats. These carry germs that can cause birth defects in the baby.  Take your prenatal vitamins.  Try taking a stool softener (if your caregiver approves) if you develop constipation. Eat more high-fiber foods, such as fresh vegetables or fruit and whole grains. Drink plenty of fluids to keep your urine clear or pale yellow.  Take warm sitz baths to soothe any pain or discomfort caused by hemorrhoids. Use hemorrhoid cream if your caregiver approves.  If you develop varicose veins, wear support hose. Elevate your feet for 15 minutes, 3-4 times a day. Limit salt in your diet.  Avoid heavy lifting, wear low heal shoes, and practice good posture.  Rest a lot with your legs elevated if you have leg cramps or low back pain.  Visit your dentist if you have not gone during your pregnancy. Use a soft toothbrush to brush your teeth and be gentle when you floss.  A sexual relationship may be continued unless your caregiver directs you otherwise.  Do not travel far distances unless it is absolutely necessary and only with the approval  of your caregiver.  Take prenatal classes to understand, practice, and ask questions about the labor and delivery.  Make a trial run to the hospital.  Pack your hospital bag.  Prepare the baby's nursery.  Continue to go to all your prenatal visits as directed by your caregiver. SEEK MEDICAL CARE IF:  You are unsure if you are in labor or if your water has broken.  You have dizziness.  You have mild pelvic cramps, pelvic pressure, or nagging pain in your abdominal area.  You have persistent nausea, vomiting, or diarrhea.  You have a bad smelling vaginal discharge.  You have pain with urination. SEEK IMMEDIATE MEDICAL CARE IF:   You have a fever.  You are leaking fluid from your vagina.  You have spotting or bleeding from your vagina.  You have severe abdominal cramping or pain.  You have rapid weight loss or gain.  You have shortness of breath with chest pain.  You notice sudden or extreme swelling   of your face, hands, ankles, feet, or legs.  You have not felt your baby move in over an hour.  You have severe headaches that do not go away with medicine.  You have vision changes. Document Released: 03/20/2001 Document Revised: 03/31/2013 Document Reviewed: 05/27/2012 [employer] Patient Information 2015 [employer]. This information is not intended to replace advice given to you by your health care provider. Make sure you discuss any questions you have with your health care provider.  

## 2014-02-01 NOTE — Progress Notes (Signed)
Has not been checking blood sugars at all for about 1 week.  Discussed importance of checking.  No other complaints.  28 week labs today. Tdap and Flu today.

## 2014-02-02 LAB — CBC
HCT: 33.9 % — ABNORMAL LOW (ref 36.0–46.0)
Hemoglobin: 11.4 g/dL — ABNORMAL LOW (ref 12.0–15.0)
MCH: 28.6 pg (ref 26.0–34.0)
MCHC: 33.6 g/dL (ref 30.0–36.0)
MCV: 85 fL (ref 78.0–100.0)
PLATELETS: 197 10*3/uL (ref 150–400)
RBC: 3.99 MIL/uL (ref 3.87–5.11)
RDW: 13.7 % (ref 11.5–15.5)
WBC: 8.2 10*3/uL (ref 4.0–10.5)

## 2014-02-02 LAB — RPR

## 2014-02-02 LAB — HIV ANTIBODY (ROUTINE TESTING W REFLEX): HIV 1&2 Ab, 4th Generation: NONREACTIVE

## 2014-02-08 ENCOUNTER — Encounter: Payer: Self-pay | Admitting: Obstetrics and Gynecology

## 2014-02-14 ENCOUNTER — Inpatient Hospital Stay (HOSPITAL_COMMUNITY)
Admission: AD | Admit: 2014-02-14 | Discharge: 2014-02-14 | Disposition: A | Discharge status: Home / Self Care | Payer: Medicaid Other | Source: Ambulatory Visit | Attending: Family Medicine | Admitting: Family Medicine

## 2014-02-14 ENCOUNTER — Encounter (HOSPITAL_COMMUNITY): Payer: Self-pay | Admitting: *Deleted

## 2014-02-14 DIAGNOSIS — O26893 Other specified pregnancy related conditions, third trimester: Secondary | ICD-10-CM | POA: Diagnosis not present

## 2014-02-14 DIAGNOSIS — O09292 Supervision of pregnancy with other poor reproductive or obstetric history, second trimester: Secondary | ICD-10-CM

## 2014-02-14 DIAGNOSIS — R102 Pelvic and perineal pain: Secondary | ICD-10-CM | POA: Insufficient documentation

## 2014-02-14 DIAGNOSIS — O280 Abnormal hematological finding on antenatal screening of mother: Secondary | ICD-10-CM

## 2014-02-14 DIAGNOSIS — R109 Unspecified abdominal pain: Secondary | ICD-10-CM

## 2014-02-14 DIAGNOSIS — O24419 Gestational diabetes mellitus in pregnancy, unspecified control: Secondary | ICD-10-CM | POA: Insufficient documentation

## 2014-02-14 DIAGNOSIS — Z3482 Encounter for supervision of other normal pregnancy, second trimester: Secondary | ICD-10-CM

## 2014-02-14 DIAGNOSIS — Z3A28 28 weeks gestation of pregnancy: Secondary | ICD-10-CM | POA: Diagnosis not present

## 2014-02-14 DIAGNOSIS — Z8632 Personal history of gestational diabetes: Secondary | ICD-10-CM

## 2014-02-14 DIAGNOSIS — O26899 Other specified pregnancy related conditions, unspecified trimester: Secondary | ICD-10-CM

## 2014-02-14 DIAGNOSIS — O26892 Other specified pregnancy related conditions, second trimester: Secondary | ICD-10-CM

## 2014-02-14 LAB — URINALYSIS, ROUTINE W REFLEX MICROSCOPIC
Bilirubin Urine: NEGATIVE
GLUCOSE, UA: NEGATIVE mg/dL
Hgb urine dipstick: NEGATIVE
KETONES UR: NEGATIVE mg/dL
LEUKOCYTES UA: NEGATIVE
NITRITE: NEGATIVE
PROTEIN: NEGATIVE mg/dL
Specific Gravity, Urine: 1.025 (ref 1.005–1.030)
UROBILINOGEN UA: 1 mg/dL (ref 0.0–1.0)
pH: 6 (ref 5.0–8.0)

## 2014-02-14 LAB — AMNISURE RUPTURE OF MEMBRANE (ROM) NOT AT ARMC: AMNISURE: NEGATIVE

## 2014-02-14 NOTE — MAU Note (Signed)
Pt has pelvic pain that started yesterday, gets worse with movement.  It wraps around to her back.  Thinks she may be leaking urine.  Denies VB.

## 2014-02-14 NOTE — MAU Provider Note (Signed)
History   G3 p2002 at 28.3 in with c/o low abd pain and back pain that started while she was at work last nigh. She has just finished a 12 hr shift at work. Also has leaking not sure if it is her bladder.  CSN: [id number]  Arrival date and time: 02/14/14 0734   None     Chief Complaint  Patient presents with  . Pelvic Pain   HPI  OB History    Gravida Para Term Preterm AB TAB SAB Ectopic Multiple Living   _0 Past Medical History  Diagnosis Date  . Pregnancy induced hypertension   . Hernia     History reviewed. No pertinent past surgical history.  Family History  Problem Relation Age of Onset  . Diabetes Maternal Grandmother     History  Substance Use Topics  . Smoking status: Never Smoker   . Smokeless tobacco: Never Used  . Alcohol Use: No    Allergies: No Known Allergies  Prescriptions prior to admission  Medication Sig Dispense Refill Last Dose  . acetaminophen (TYLENOL) 325 MG tablet Take 650 mg by mouth every 6 (six) hours as needed for headache.   Past Week at Unknown time  . aspirin EC 81 MG tablet Take 1 tablet (81 mg total) by mouth daily. 30 tablet 10 02/13/2014 at Unknown time  . Prenatal Vit-Fe Fumarate-FA (PRENATAL MULTIVITAMIN) TABS tablet Take 1 tablet by mouth daily. 30 tablet 10 02/13/2014 at Unknown time  . Blood Glucose Monitoring Suppl (ACCU-CHEK NANO SMARTVIEW) W/DEVICE KIT 1 each by Does not apply route 4 (four) times daily. 1 kit 11 Taking  . glucose blood (ACCU-CHEK SMARTVIEW) test strip Use as instructed checking blood sugar levels 4 times daily. 100 each 12 Taking  . Lancets Misc. (ACCU-CHEK FASTCLIX LANCET) KIT 1 each by Does not apply route 4 (four) times daily. 1 kit 11 Taking  . nystatin cream (MYCOSTATIN) Apply 1 application topically 2 (two) times daily. (Patient not taking: Reported on 02/14/2014) 30 g 1 Taking  . ondansetron (ZOFRAN) 4 MG tablet Take 1 tablet (4 mg total) by mouth every 8 (eight) hours as needed for  nausea or vomiting. (Patient not taking: Reported on 02/14/2014) 10 tablet 0 Taking  . promethazine (PHENERGAN) 25 MG tablet Take 1 tablet (25 mg total) by mouth every 6 (six) hours as needed for nausea or vomiting. (Patient not taking: Reported on 02/14/2014) 30 tablet 1 Taking    Review of Systems  Constitutional: Negative.   HENT: Negative.   Eyes: Negative.   Respiratory: Negative.   Cardiovascular: Negative.   Gastrointestinal: Positive for abdominal pain.  Genitourinary: Negative.   Musculoskeletal: Positive for back pain.  Skin: Negative.   Neurological: Negative.   Endo/Heme/Allergies: Negative.   Psychiatric/Behavioral: Negative.    Physical Exam   Height _1  (1.6 m), weight 175 lb (79.379 kg), last menstrual period 08/06/2013, unknown if currently breastfeeding.  Physical Exam  Constitutional: She is oriented to person, place, and time. She appears well-developed and well-nourished.  HENT:  Head: Normocephalic.  Eyes: Pupils are equal, round, and reactive to light.  Neck: Normal range of motion.  Cardiovascular: Normal rate, regular rhythm, normal heart sounds and intact distal pulses.   Respiratory: Effort normal and breath sounds normal.  GI: Soft. Bowel sounds are normal.  Genitourinary: Vagina normal and uterus normal.  Sterile spec exam cervix closed. No active leaking with cough. No  bleeding  Musculoskeletal: Normal range of motion.  Neurological: She is alert and oriented to person, place, and time. She has normal reflexes.  Skin: Skin is warm and dry.  Psychiatric: She has a normal mood and affect. Her behavior is normal. Judgment and thought content normal.    MAU Course  Procedures  MDM Round ligament pain  Assessment and Plan  amnisure pending and will d/c home if neg. Pt to use maternity girdle for support.  Lavenia Stumpo DARLENE 02/14/2014, 9:02 AM

## 2014-02-14 NOTE — Discharge Instructions (Signed)
Braxton Hicks Contractions °Contractions of the uterus can occur throughout pregnancy. Contractions are not always a sign that you are in labor.  °WHAT ARE BRAXTON HICKS CONTRACTIONS?  °Contractions that occur before labor are called Braxton Hicks contractions, or false labor. Toward the end of pregnancy (32-34 weeks), these contractions can develop more often and may become more forceful. This is not true labor because these contractions do not result in opening (dilatation) and thinning of the cervix. They are sometimes difficult to tell apart from true labor because these contractions can be forceful and people have different pain tolerances. You should not feel embarrassed if you go to the hospital with false labor. Sometimes, the only way to tell if you are in true labor is for your health care provider to look for changes in the cervix. °If there are no prenatal problems or other health problems associated with the pregnancy, it is completely safe to be sent home with false labor and await the onset of true labor. °HOW CAN YOU TELL THE DIFFERENCE BETWEEN TRUE AND FALSE LABOR? °False Labor °· The contractions of false labor are usually shorter and not as hard as those of true labor.   °· The contractions are usually irregular.   °· The contractions are often felt in the front of the lower abdomen and in the groin.   °· The contractions may go away when you walk around or change positions while lying down.   °· The contractions get weaker and are shorter lasting as time goes on.   °· The contractions do not usually become progressively stronger, regular, and closer together as with true labor.   °True Labor °· Contractions in true labor last 30-70 seconds, become very regular, usually become more intense, and increase in frequency.   °· The contractions do not go away with walking.   °· The discomfort is usually felt in the top of the uterus and spreads to the lower abdomen and low back.   °· True labor can be  determined by your health care provider with an exam. This will show that the cervix is dilating and getting thinner.   °WHAT TO REMEMBER °· Keep up with your usual exercises and follow other instructions given by your health care provider.   °· Take medicines as directed by your health care provider.   °· Keep your regular prenatal appointments.   °· Eat and drink lightly if you think you are going into labor.   °· If Braxton Hicks contractions are making you uncomfortable:   °· Change your position from lying down or resting to walking, or from walking to resting.   °· Sit and rest in a tub of warm water.   °· Drink 2-3 glasses of water. Dehydration may cause these contractions.   °· Do slow and deep breathing several times an hour.   °WHEN SHOULD I SEEK IMMEDIATE MEDICAL CARE? °Seek immediate medical care if: °· Your contractions become stronger, more regular, and closer together.   °· You have fluid leaking or gushing from your vagina.   °· You have a fever.   °· You pass blood-tinged mucus.   °· You have vaginal bleeding.   °· You have continuous abdominal pain.   °· You have low back pain that you never had before.   °· You feel your baby's head pushing down and causing pelvic pressure.   °· Your baby is not moving as much as it used to.   °Document Released: 03/26/2005 Document Revised: 03/31/2013 Document Reviewed: 01/05/2013 °Exit[employer]® Patient Information ©2015 [employer]. This information is not intended to replace advice given to you by your health care   provider. Make sure you discuss any questions you have with your health care provider.    Fetal Movement Counts Patient Name: __________________________________________________ Patient Due Date: ____________________ Performing a fetal movement count is highly recommended in high-risk pregnancies, but it is good for every pregnant woman to do. Your health care provider may ask you to start counting fetal movements at 28 weeks of the pregnancy.  Fetal movements often increase:  After eating a full meal.  After physical activity.  After eating or drinking something sweet or cold.  At rest. Pay attention to when you feel the baby is most active. This will help you notice a pattern of your baby's sleep and wake cycles and what factors contribute to an increase in fetal movement. It is important to perform a fetal movement count at the same time each day when your baby is normally most active.  HOW TO COUNT FETAL MOVEMENTS  Find a quiet and comfortable area to sit or lie down on your left side. Lying on your left side provides the best blood and oxygen circulation to your baby.  Write down the day and time on a sheet of paper or in a journal.  Start counting kicks, flutters, swishes, rolls, or jabs in a 2-hour period. You should feel at least 10 movements within 2 hours.  If you do not feel 10 movements in 2 hours, wait 2-3 hours and count again. Look for a change in the pattern or not enough counts in 2 hours. SEEK MEDICAL CARE IF:  You feel less than 10 counts in 2 hours, tried twice.  There is no movement in over an hour.  The pattern is changing or taking longer each day to reach 10 counts in 2 hours.  You feel the baby is not moving as he or she usually does. Date: ____________ Movements: ____________ Start time: ____________ Doreatha MartinFinish time: ____________  Date: ____________ Movements: ____________ Start time: ____________ Doreatha MartinFinish time: ____________ Date: ____________ Movements: ____________ Start time: ____________ Doreatha MartinFinish time: ____________ Date: ____________ Movements: ____________ Start time: ____________ Doreatha MartinFinish time: ____________ Date: ____________ Movements: ____________ Start time: ____________ Doreatha MartinFinish time: ____________ Date: ____________ Movements: ____________ Start time: ____________ Doreatha MartinFinish time: ____________ Date: ____________ Movements: ____________ Start time: ____________ Doreatha MartinFinish time: ____________ Date:  ____________ Movements: ____________ Start time: ____________ Doreatha MartinFinish time: ____________  Date: ____________ Movements: ____________ Start time: ____________ Doreatha MartinFinish time: ____________ Date: ____________ Movements: ____________ Start time: ____________ Doreatha MartinFinish time: ____________ Date: ____________ Movements: ____________ Start time: ____________ Doreatha MartinFinish time: ____________ Date: ____________ Movements: ____________ Start time: ____________ Doreatha MartinFinish time: ____________ Date: ____________ Movements: ____________ Start time: ____________ Doreatha MartinFinish time: ____________ Date: ____________ Movements: ____________ Start time: ____________ Doreatha MartinFinish time: ____________ Date: ____________ Movements: ____________ Start time: ____________ Doreatha MartinFinish time: ____________  Date: ____________ Movements: ____________ Start time: ____________ Doreatha MartinFinish time: ____________ Date: ____________ Movements: ____________ Start time: ____________ Doreatha MartinFinish time: ____________ Date: ____________ Movements: ____________ Start time: ____________ Doreatha MartinFinish time: ____________ Date: ____________ Movements: ____________ Start time: ____________ Doreatha MartinFinish time: ____________ Date: ____________ Movements: ____________ Start time: ____________ Doreatha MartinFinish time: ____________ Date: ____________ Movements: ____________ Start time: ____________ Doreatha MartinFinish time: ____________ Date: ____________ Movements: ____________ Start time: ____________ Doreatha MartinFinish time: ____________  Date: ____________ Movements: ____________ Start time: ____________ Doreatha MartinFinish time: ____________ Date: ____________ Movements: ____________ Start time: ____________ Doreatha MartinFinish time: ____________ Date: ____________ Movements: ____________ Start time: ____________ Doreatha MartinFinish time: ____________ Date: ____________ Movements: ____________ Start time: ____________ Doreatha MartinFinish time: ____________ Date: ____________ Movements: ____________ Start time: ____________ Doreatha MartinFinish time: ____________ Date: ____________ Movements: ____________ Start  time: ____________ Doreatha MartinFinish time: ____________ Date:  ____________ Movements: ____________ Start time: ____________ Doreatha Martin time: ____________  Date: ____________ Movements: ____________ Start time: ____________ Doreatha Martin time: ____________ Date: ____________ Movements: ____________ Start time: ____________ Doreatha Martin time: ____________ Date: ____________ Movements: ____________ Start time: ____________ Doreatha Martin time: ____________ Date: ____________ Movements: ____________ Start time: ____________ Doreatha Martin time: ____________ Date: ____________ Movements: ____________ Start time: ____________ Doreatha Martin time: ____________ Date: ____________ Movements: ____________ Start time: ____________ Doreatha Martin time: ____________ Date: ____________ Movements: ____________ Start time: ____________ Doreatha Martin time: ____________  Date: ____________ Movements: ____________ Start time: ____________ Doreatha Martin time: ____________ Date: ____________ Movements: ____________ Start time: ____________ Doreatha Martin time: ____________ Date: ____________ Movements: ____________ Start time: ____________ Doreatha Martin time: ____________ Date: ____________ Movements: ____________ Start time: ____________ Doreatha Martin time: ____________ Date: ____________ Movements: ____________ Start time: ____________ Doreatha Martin time: ____________ Date: ____________ Movements: ____________ Start time: ____________ Doreatha Martin time: ____________ Date: ____________ Movements: ____________ Start time: ____________ Doreatha Martin time: ____________  Date: ____________ Movements: ____________ Start time: ____________ Doreatha Martin time: ____________ Date: ____________ Movements: ____________ Start time: ____________ Doreatha Martin time: ____________ Date: ____________ Movements: ____________ Start time: ____________ Doreatha Martin time: ____________ Date: ____________ Movements: ____________ Start time: ____________ Doreatha Martin time: ____________ Date: ____________ Movements: ____________ Start time: ____________ Doreatha Martin time:  ____________ Date: ____________ Movements: ____________ Start time: ____________ Doreatha Martin time: ____________ Date: ____________ Movements: ____________ Start time: ____________ Doreatha Martin time: ____________  Date: ____________ Movements: ____________ Start time: ____________ Doreatha Martin time: ____________ Date: ____________ Movements: ____________ Start time: ____________ Doreatha Martin time: ____________ Date: ____________ Movements: ____________ Start time: ____________ Doreatha Martin time: ____________ Date: ____________ Movements: ____________ Start time: ____________ Doreatha Martin time: ____________ Date: ____________ Movements: ____________ Start time: ____________ Doreatha Martin time: ____________ Date: ____________ Movements: ____________ Start time: ____________ Doreatha Martin time: ____________ Document Released: 04/25/2006 Document Revised: 08/10/2013 Document Reviewed: 01/21/2012 ExitCare Patient Information 2015 [employer]. This information is not intended to replace advice given to you by your health care provider. Make sure you discuss any questions you have with your health care provider.   Abdominal Pain During Pregnancy Abdominal pain is common in pregnancy. Most of the time, it does not cause harm. There are many causes of abdominal pain. Some causes are more serious than others. Some of the causes of abdominal pain in pregnancy are easily diagnosed. Occasionally, the diagnosis takes time to understand. Other times, the cause is not determined. Abdominal pain can be a sign that something is very wrong with the pregnancy, or the pain may have nothing to do with the pregnancy at all. For this reason, always tell your health care provider if you have any abdominal discomfort. HOME CARE INSTRUCTIONS  Monitor your abdominal pain for any changes. The following actions may help to alleviate any discomfort you are experiencing:  Do not have sexual intercourse or put anything in your vagina until your symptoms go away  completely.  Get plenty of rest until your pain improves.  Drink clear fluids if you feel nauseous. Avoid solid food as long as you are uncomfortable or nauseous.  Only take over-the-counter or prescription medicine as directed by your health care provider.  Keep all follow-up appointments with your health care provider. SEEK IMMEDIATE MEDICAL CARE IF:  You are bleeding, leaking fluid, or passing tissue from the vagina.  You have increasing pain or cramping.  You have persistent vomiting.  You have painful or bloody urination.  You have a fever.  You notice a decrease in your baby's movements.  You have extreme weakness or feel faint.  You have shortness of breath, with or without abdominal pain.  You develop a severe headache  with abdominal pain.  You have abnormal vaginal discharge with abdominal pain.  You have persistent diarrhea.  You have abdominal pain that continues even after rest, or gets worse. MAKE SURE YOU:   Understand these instructions.  Will watch your condition.  Will get help right away if you are not doing well or get worse. Document Released: 03/26/2005 Document Revised: 01/14/2013 Document Reviewed: 10/23/2012 [facility] Patient Information 2015 [place], [place]. This information is not intended to replace advice given to you by your health care provider. Make sure you discuss any questions you have with your health care provider.

## 2014-02-22 ENCOUNTER — Encounter: Payer: Self-pay | Admitting: Obstetrics & Gynecology

## 2014-02-26 ENCOUNTER — Encounter: Payer: Self-pay | Admitting: Obstetrics and Gynecology

## 2014-03-10 ENCOUNTER — Encounter: Payer: Self-pay | Admitting: Obstetrics & Gynecology

## 2014-03-11 ENCOUNTER — Ambulatory Visit (INDEPENDENT_AMBULATORY_CARE_PROVIDER_SITE_OTHER): Payer: Medicaid Other | Admitting: Family Medicine

## 2014-03-11 VITALS — BP 117/70 | HR 84 | Wt 178.2 lb

## 2014-03-11 DIAGNOSIS — O280 Abnormal hematological finding on antenatal screening of mother: Secondary | ICD-10-CM

## 2014-03-11 DIAGNOSIS — O24913 Unspecified diabetes mellitus in pregnancy, third trimester: Secondary | ICD-10-CM

## 2014-03-11 DIAGNOSIS — Z3483 Encounter for supervision of other normal pregnancy, third trimester: Secondary | ICD-10-CM

## 2014-03-11 DIAGNOSIS — O289 Unspecified abnormal findings on antenatal screening of mother: Secondary | ICD-10-CM

## 2014-03-11 MED ORDER — GLYBURIDE 2.5 MG PO TABS: 2.5000 mg | ORAL_TABLET | Freq: Every day | ORAL | Status: DC

## 2014-03-11 NOTE — Patient Instructions (Addendum)
Following an appropriate diet and keeping your blood sugar under control is the most important thing to do for your health and that of your unborn baby.  Please check your blood sugar 4 times daily.  Please keep accurate BS logs and bring them with you to every visit.  Please bring your meter also.  Goals for Blood sugar should be: 1. Fasting (first thing in the morning before eating) should be less than 90.   2.  2 hours after meals should be less than 120.  Please eat 3 meals and 3 snacks.  Include protein (meat, dairy-cheese, eggs, nuts) with all meals.  Be mindful that carbohydrates increase your blood sugar.  Not just sweet food (cookies, cake, donuts, fruit, juice, soda) but also bread, pasta, rice, and corn and potatoes.  You have to limit how many carbs you are eating.  Adding exercise, as little as 30 minutes a day can decrease your blood sugar.  Gestational Diabetes Mellitus Gestational diabetes mellitus, often simply referred to as gestational diabetes, is a type of diabetes that some women develop during pregnancy. In gestational diabetes, the pancreas does not make enough insulin (a hormone), the cells are less responsive to the insulin that is made (insulin resistance), or both.Normally, insulin moves sugars from food into the tissue cells. The tissue cells use the sugars for energy. The lack of insulin or the lack of normal response to insulin causes excess sugars to build up in the blood instead of going into the tissue cells. As a result, high blood sugar (hyperglycemia) develops. The effect of high sugar (glucose) levels can cause many problems.  RISK FACTORS You have an increased chance of developing gestational diabetes if you have a family history of diabetes and also have one or more of the following risk factors:  A body mass index over 30 (obesity).  A previous pregnancy with gestational diabetes.  An older age at the time of pregnancy. If blood glucose levels are  kept in the normal range during pregnancy, women can have a healthy pregnancy. If your blood glucose levels are not well controlled, there may be risks to you, your unborn baby (fetus), your labor and delivery, or your newborn baby.  SYMPTOMS  If symptoms are experienced, they are much like symptoms you would normally expect during pregnancy. The symptoms of gestational diabetes include:   Increased thirst (polydipsia).  Increased urination (polyuria).  Increased urination during the night (nocturia).  Weight loss. This weight loss may be rapid.  Frequent, recurring infections.  Tiredness (fatigue).  Weakness.  Vision changes, such as blurred vision.  Fruity smell to your breath.  Abdominal pain. DIAGNOSIS Diabetes is diagnosed when blood glucose levels are increased. Your blood glucose level may be checked by one or more of the following blood tests:  A fasting blood glucose test. You will not be allowed to eat for at least 8 hours before a blood sample is taken.  A random blood glucose test. Your blood glucose is checked at any time of the day regardless of when you ate.  A hemoglobin A1c blood glucose test. A hemoglobin A1c test provides information about blood glucose control over the previous 3 months.  An oral glucose tolerance test (OGTT). Your blood glucose is measured after you have not eaten (fasted) for 1-3 hours and then after you drink a glucose-containing beverage. Since the hormones that cause insulin resistance are highest at about 24-28 weeks of a pregnancy, an OGTT is usually performed during that  time. If you have risk factors for gestational diabetes, your health care provider may test you for gestational diabetes earlier than 24 weeks of pregnancy. TREATMENT   You will need to take diabetes medicine or insulin daily to keep blood glucose levels in the desired range.  You will need to match insulin dosing with exercise and healthy food choices. The treatment  goal is to maintain the before-meal (preprandial), bedtime, and overnight blood glucose level at 60-99 mg/dL during pregnancy. The treatment goal is to further maintain peak after-meal blood sugar (postprandial glucose) level at 100-140 mg/dL. HOME CARE INSTRUCTIONS   Have your hemoglobin A1c level checked twice a year.  Perform daily blood glucose monitoring as directed by your health care provider. It is common to perform frequent blood glucose monitoring.  Monitor urine ketones when you are ill and as directed by your health care provider.  Take your diabetes medicine and insulin as directed by your health care provider to maintain your blood glucose level in the desired range.  Never run out of diabetes medicine or insulin. It is needed every day.  Adjust insulin based on your intake of carbohydrates. Carbohydrates can raise blood glucose levels but need to be included in your diet. Carbohydrates provide vitamins, minerals, and fiber which are an essential part of a healthy diet. Carbohydrates are found in fruits, vegetables, whole grains, dairy products, legumes, and foods containing added sugars.  Eat healthy foods. Alternate 3 meals with 3 snacks.  Maintain a healthy weight gain. The usual total expected weight gain varies according to your prepregnancy body mass index (BMI).  Carry a medical alert card or wear your medical alert jewelry.  Carry a 15-gram carbohydrate snack with you at all times to treat low blood glucose (hypoglycemia). Some examples of 15-gram carbohydrate snacks include:  Glucose tablets, 3 or 4.  Glucose gel, 15-gram tube.  Raisins, 2 tablespoons (24 g).  Jelly beans, 6.  Animal crackers, 8.  Fruit juice, regular soda, or low-fat milk, 4 ounces (120 mL).  Gummy treats, 9.  Recognize hypoglycemia. Hypoglycemia during pregnancy occurs with blood glucose levels of 60 mg/dL and below. The risk for hypoglycemia increases when fasting or skipping meals,  during or after intense exercise, and during sleep. Hypoglycemia symptoms can include:  Tremors or shakes.  Decreased ability to concentrate.  Sweating.  Increased heart rate.  Headache.  Dry mouth.  Hunger.  Irritability.  Anxiety.  Restless sleep.  Altered speech or coordination.  Confusion.  Treat hypoglycemia promptly. If you are alert and able to safely swallow, follow the 15:15 rule:  Take 15-20 grams of rapid-acting glucose or carbohydrate. Rapid-acting options include glucose gel, glucose tablets, or 4 ounces (120 mL) of fruit juice, regular soda, or low-fat milk.  Check your blood glucose level 15 minutes after taking the glucose.  Take 15-20 grams more of glucose if the repeat blood glucose level is still 70 mg/dL or below.  Eat a meal or snack within 1 hour once blood glucose levels return to normal.  Be alert to polyuria (excess urination) and polydipsia (excess thirst) which are early signs of hyperglycemia. An early awareness of hyperglycemia allows for prompt treatment. Treat hyperglycemia as directed by your health care provider.  Engage in at least 30 minutes of physical activity a day or as directed by your health care provider. Ten minutes of physical activity timed 30 minutes after each meal is encouraged to control postprandial blood glucose levels.  Adjust your insulin dosing and food  intake as needed if you start a new exercise or sport.  Follow your sick-day plan at any time you are unable to eat or drink as usual.  Avoid tobacco and alcohol use.  Keep all follow-up visits as directed by your health care provider.  Follow the advice of your health care provider regarding your prenatal and post-delivery (postpartum) appointments, meal planning, exercise, medicines, vitamins, blood tests, other medical tests, and physical activities.  Perform daily skin and foot care. Examine your skin and feet daily for cuts, bruises, redness, nail problems,  bleeding, blisters, or sores.  Brush your teeth and gums at least twice a day and floss at least once a day. Follow up with your dentist regularly.  Schedule an eye exam during the first trimester of your pregnancy or as directed by your health care provider.  Share your diabetes management plan with your workplace or school.  Stay up-to-date with immunizations.  Learn to manage stress.  Obtain ongoing diabetes education and support as needed.  Learn about and consider breastfeeding your baby.  You should have your blood sugar level checked 6-12 weeks after delivery. This is done with an oral glucose tolerance test (OGTT). SEEK MEDICAL CARE IF:   You are unable to eat food or drink fluids for more than 6 hours.  You have nausea and vomiting for more than 6 hours.  You have a blood glucose level of 200 mg/dL and you have ketones in your urine.  There is a change in mental status.  You develop vision problems.  You have a persistent headache.  You have upper abdominal pain or discomfort.  You develop an additional serious illness.  You have diarrhea for more than 6 hours.  You have been sick or have had a fever for a couple of days and are not getting better. SEEK IMMEDIATE MEDICAL CARE IF:   You have difficulty breathing.  You no longer feel the baby moving.  You are bleeding or have discharge from your vagina.  You start having premature contractions or labor. MAKE SURE YOU:  Understand these instructions.  Will watch your condition.  Will get help right away if you are not doing well or get worse. Document Released: 07/02/2000 Document Revised: 08/10/2013 Document Reviewed: 10/23/2011 [facility] Patient Information 2015 [place], [place]. This information is not intended to replace advice given to you by your health care provider. Make sure you discuss any questions you have with your health care provider.  Breastfeeding Deciding to breastfeed is one of the best  choices you can make for you and your baby. A change in hormones during pregnancy causes your breast tissue to grow and increases the number and size of your milk ducts. These hormones also allow proteins, sugars, and fats from your blood supply to make breast milk in your milk-producing glands. Hormones prevent breast milk from being released before your baby is born as well as prompt milk flow after birth. Once breastfeeding has begun, thoughts of your baby, as well as his or her sucking or crying, can stimulate the release of milk from your milk-producing glands.  BENEFITS OF BREASTFEEDING For Your Baby  Your first milk (colostrum) helps your baby's digestive system function better.   There are antibodies in your milk that help your baby fight off infections.   Your baby has a lower incidence of asthma, allergies, and sudden infant death syndrome.   The nutrients in breast milk are better for your baby than infant formulas and are designed  uniquely for your baby's needs.   Breast milk improves your baby's brain development.   Your baby is less likely to develop other conditions, such as childhood obesity, asthma, or type 2 diabetes mellitus.  For You   Breastfeeding helps to create a very special bond between you and your baby.   Breastfeeding is convenient. Breast milk is always available at the correct temperature and costs nothing.   Breastfeeding helps to burn calories and helps you lose the weight gained during pregnancy.   Breastfeeding makes your uterus contract to its prepregnancy size faster and slows bleeding (lochia) after you give birth.   Breastfeeding helps to lower your risk of developing type 2 diabetes mellitus, osteoporosis, and breast or ovarian cancer later in life. SIGNS THAT YOUR BABY IS HUNGRY Early Signs of Hunger  Increased alertness or activity.  Stretching.  Movement of the head from side to side.  Movement of the head and opening of the  mouth when the corner of the mouth or cheek is stroked (rooting).  Increased sucking sounds, smacking lips, cooing, sighing, or squeaking.  Hand-to-mouth movements.  Increased sucking of fingers or hands. Late Signs of Hunger  Fussing.  Intermittent crying. Extreme Signs of Hunger Signs of extreme hunger will require calming and consoling before your baby will be able to breastfeed successfully. Do not wait for the following signs of extreme hunger to occur before you initiate breastfeeding:   Restlessness.  A loud, strong cry.   Screaming. BREASTFEEDING BASICS Breastfeeding Initiation  Find a comfortable place to sit or lie down, with your neck and back well supported.  Place a pillow or rolled up blanket under your baby to bring him or her to the level of your breast (if you are seated). Nursing pillows are specially designed to help support your arms and your baby while you breastfeed.  Make sure that your baby's abdomen is facing your abdomen.   Gently massage your breast. With your fingertips, massage from your chest wall toward your nipple in a circular motion. This encourages milk flow. You may need to continue this action during the feeding if your milk flows slowly.  Support your breast with 4 fingers underneath and your thumb above your nipple. Make sure your fingers are well away from your nipple and your baby's mouth.   Stroke your baby's lips gently with your finger or nipple.   When your baby's mouth is open wide enough, quickly bring your baby to your breast, placing your entire nipple and as much of the colored area around your nipple (areola) as possible into your baby's mouth.   More areola should be visible above your baby's upper lip than below the lower lip.   Your baby's tongue should be between his or her lower gum and your breast.   Ensure that your baby's mouth is correctly positioned around your nipple (latched). Your baby's lips should create  a seal on your breast and be turned out (everted).  It is common for your baby to suck about 2-3 minutes in order to start the flow of breast milk. Latching Teaching your baby how to latch on to your breast properly is very important. An improper latch can cause nipple pain and decreased milk supply for you and poor weight gain in your baby. Also, if your baby is not latched onto your nipple properly, he or she may swallow some air during feeding. This can make your baby fussy. Burping your baby when you switch breasts during  the feeding can help to get rid of the air. However, teaching your baby to latch on properly is still the best way to prevent fussiness from swallowing air while breastfeeding. Signs that your baby has successfully latched on to your nipple:    Silent tugging or silent sucking, without causing you pain.   Swallowing heard between every 3-4 sucks.    Muscle movement above and in front of his or her ears while sucking.  Signs that your baby has not successfully latched on to nipple:   Sucking sounds or smacking sounds from your baby while breastfeeding.  Nipple pain. If you think your baby has not latched on correctly, slip your finger into the corner of your baby's mouth to break the suction and place it between your baby's gums. Attempt breastfeeding initiation again. Signs of Successful Breastfeeding Signs from your baby:   A gradual decrease in the number of sucks or complete cessation of sucking.   Falling asleep.   Relaxation of his or her body.   Retention of a small amount of milk in his or her mouth.   Letting go of your breast by himself or herself. Signs from you:  Breasts that have increased in firmness, weight, and size 1-3 hours after feeding.   Breasts that are softer immediately after breastfeeding.  Increased milk volume, as well as a change in milk consistency and color by the fifth day of breastfeeding.   Nipples that are not  sore, cracked, or bleeding. Signs That Your Pecola Leisure is Getting Enough Milk  Wetting at least 3 diapers in a 24-hour period. The urine should be clear and pale yellow by age 104 days.  At least 3 stools in a 24-hour period by age 104 days. The stool should be soft and yellow.  At least 3 stools in a 24-hour period by age 10 days. The stool should be seedy and yellow.  No loss of weight greater than 10% of birth weight during the first 22 days of age.  Average weight gain of 4-7 ounces (113-198 g) per week after age 65 days.  Consistent daily weight gain by age 104 days, without weight loss after the age of 2 weeks. After a feeding, your baby may spit up a small amount. This is common. BREASTFEEDING FREQUENCY AND DURATION Frequent feeding will help you make more milk and can prevent sore nipples and breast engorgement. Breastfeed when you feel the need to reduce the fullness of your breasts or when your baby shows signs of hunger. This is called "breastfeeding on demand." Avoid introducing a pacifier to your baby while you are working to establish breastfeeding (the first 4-6 weeks after your baby is born). After this time you may choose to use a pacifier. Research has shown that pacifier use during the first year of a baby's life decreases the risk of sudden infant death syndrome (SIDS). Allow your baby to feed on each breast as long as he or she wants. Breastfeed until your baby is finished feeding. When your baby unlatches or falls asleep while feeding from the first breast, offer the second breast. Because newborns are often sleepy in the first few weeks of life, you may need to awaken your baby to get him or her to feed. Breastfeeding times will vary from baby to baby. However, the following rules can serve as a guide to help you ensure that your baby is properly fed:  Newborns (babies 77 weeks of age or younger) may breastfeed every 1-3  hours.  Newborns should not go longer than 3 hours during the day  or 5 hours during the night without breastfeeding.  You should breastfeed your baby a minimum of 8 times in a 24-hour period until you begin to introduce solid foods to your baby at around 13 months of age. BREAST MILK PUMPING Pumping and storing breast milk allows you to ensure that your baby is exclusively fed your breast milk, even at times when you are unable to breastfeed. This is especially important if you are going back to work while you are still breastfeeding or when you are not able to be present during feedings. Your lactation consultant can give you guidelines on how long it is safe to store breast milk.  A breast pump is a machine that allows you to pump milk from your breast into a sterile bottle. The pumped breast milk can then be stored in a refrigerator or freezer. Some breast pumps are operated by hand, while others use electricity. Ask your lactation consultant which type will work best for you. Breast pumps can be purchased, but some hospitals and breastfeeding support groups lease breast pumps on a monthly basis. A lactation consultant can teach you how to hand express breast milk, if you prefer not to use a pump.  CARING FOR YOUR BREASTS WHILE YOU BREASTFEED Nipples can become dry, cracked, and sore while breastfeeding. The following recommendations can help keep your breasts moisturized and healthy:  Avoid using soap on your nipples.   Wear a supportive bra. Although not required, special nursing bras and tank tops are designed to allow access to your breasts for breastfeeding without taking off your entire bra or top. Avoid wearing underwire-style bras or extremely tight bras.  Air dry your nipples for 3-58minutes after each feeding.   Use only cotton bra pads to absorb leaked breast milk. Leaking of breast milk between feedings is normal.   Use lanolin on your nipples after breastfeeding. Lanolin helps to maintain your skin's normal moisture barrier. If you use pure  lanolin, you do not need to wash it off before feeding your baby again. Pure lanolin is not toxic to your baby. You may also hand express a few drops of breast milk and gently massage that milk into your nipples and allow the milk to air dry. In the first few weeks after giving birth, some women experience extremely full breasts (engorgement). Engorgement can make your breasts feel heavy, warm, and tender to the touch. Engorgement peaks within 3-5 days after you give birth. The following recommendations can help ease engorgement:  Completely empty your breasts while breastfeeding or pumping. You may want to start by applying warm, moist heat (in the shower or with warm water-soaked hand towels) just before feeding or pumping. This increases circulation and helps the milk flow. If your baby does not completely empty your breasts while breastfeeding, pump any extra milk after he or she is finished.  Wear a snug bra (nursing or regular) or tank top for 1-2 days to signal your body to slightly decrease milk production.  Apply ice packs to your breasts, unless this is too uncomfortable for you.  Make sure that your baby is latched on and positioned properly while breastfeeding. If engorgement persists after 48 hours of following these recommendations, contact your health care provider or a Advertising copywriter. OVERALL HEALTH CARE RECOMMENDATIONS WHILE BREASTFEEDING  Eat healthy foods. Alternate between meals and snacks, eating 3 of each per day. Because what you eat affects  your breast milk, some of the foods may make your baby more irritable than usual. Avoid eating these foods if you are sure that they are negatively affecting your baby.  Drink milk, fruit juice, and water to satisfy your thirst (about 10 glasses a day).   Rest often, relax, and continue to take your prenatal vitamins to prevent fatigue, stress, and anemia.  Continue breast self-awareness checks.  Avoid chewing and smoking  tobacco.  Avoid alcohol and drug use. Some medicines that may be harmful to your baby can pass through breast milk. It is important to ask your health care provider before taking any medicine, including all over-the-counter and prescription medicine as well as vitamin and herbal supplements. It is possible to become pregnant while breastfeeding. If birth control is desired, ask your health care provider about options that will be safe for your baby. SEEK MEDICAL CARE IF:   You feel like you want to stop breastfeeding or have become frustrated with breastfeeding.  You have painful breasts or nipples.  Your nipples are cracked or bleeding.  Your breasts are red, tender, or warm.  You have a swollen area on either breast.  You have a fever or chills.  You have nausea or vomiting.  You have drainage other than breast milk from your nipples.  Your breasts do not become full before feedings by the fifth day after you give birth.  You feel sad and depressed.  Your baby is too sleepy to eat well.  Your baby is having trouble sleeping.   Your baby is wetting less than 3 diapers in a 24-hour period.  Your baby has less than 3 stools in a 24-hour period.  Your baby's skin or the white part of his or her eyes becomes yellow.   Your baby is not gaining weight by 44 days of age. SEEK IMMEDIATE MEDICAL CARE IF:   Your baby is overly tired (lethargic) and does not want to wake up and feed.  Your baby develops an unexplained fever. Document Released: 03/26/2005 Document Revised: 03/31/2013 Document Reviewed: 09/17/2012 [facility] Patient Information 2015 [place], [place]. This information is not intended to replace advice given to you by your health care provider. Make sure you discuss any questions you have with your health care provider.

## 2014-03-11 NOTE — Progress Notes (Signed)
No book but reports checking BS and most fastings are high in the 120's Postprandial Bs are in the 120's as well Begin Glyburide 2.5 mg q hs Needs 2x/wk testing. Trial of maternity belt due pain while working. NST reviewed and reactive.

## 2014-03-15 ENCOUNTER — Ambulatory Visit (INDEPENDENT_AMBULATORY_CARE_PROVIDER_SITE_OTHER): Payer: Medicaid Other | Admitting: *Deleted

## 2014-03-15 ENCOUNTER — Encounter: Payer: Medicaid Other | Attending: Obstetrics & Gynecology | Admitting: *Deleted

## 2014-03-15 VITALS — BP 108/66 | HR 85 | Wt 177.8 lb

## 2014-03-15 DIAGNOSIS — O24913 Unspecified diabetes mellitus in pregnancy, third trimester: Secondary | ICD-10-CM | POA: Insufficient documentation

## 2014-03-15 DIAGNOSIS — Z713 Dietary counseling and surveillance: Secondary | ICD-10-CM | POA: Diagnosis not present

## 2014-03-15 NOTE — Progress Notes (Addendum)
Nutrition note: 1st visit consult & GDM diet education Pt has GDM & had it during her previous pregnancy. Pt has gained 44.8# @ 6737w6d, which is > expected. Pt reports eating 3 meals & 5 snacks/d. Pt works night shift (7p-7a). Pt reports drinking Pepsi ~4x/d. Pt is taking a PNV. Pt reports no N/V but has some heartburn. Pt reports no physical activity. Pt received verbal & written education on GDM diet. Discussed tips to decrease heartburn & benefits of physical activity. Encouraged decreasing/ stopping soda. Discussed wt gain goals of 25-35# or 1#/wk. Pt agrees to follow GDM diet with 3 meals & 3 snacks/d with proper CHO/ protein combination. Pt does not have WIC but plans to apply. Pt plans to BF. F/u in 2-4 wks Blondell RevealLaura Fatime Biswell, MS, RD, LDN, [facility]

## 2014-03-15 NOTE — Progress Notes (Signed)
US growth scheduled 12/8 @ 1600

## 2014-03-15 NOTE — Progress Notes (Signed)
Patient presents with no glucose log/monitor. She has not started taking the glyburide. She feels since she is already 33 weeks that it is too late to worry about the blood sugar. I emphasized the risks of uncontrolled glucose during pregnancy and the need for close glucose control until delivery. 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 A great deal of education was provided. She was test after her long period of sleep. She will test after three meals per day regardless of what time of day that may be. She will meed with Dietitian today. In support of her reluctance, Scarlett PrestoShatona will hold off on starting glyburide, start testing today at the appropriate times and manage her diet more effectively.  [facility] glucometer and log book. After evaluation of valid reading she will take glyburide if indicated.

## 2014-03-16 ENCOUNTER — Ambulatory Visit (HOSPITAL_COMMUNITY)
Admission: RE | Admit: 2014-03-16 | Discharge: 2014-03-16 | Disposition: A | Discharge status: Home / Self Care | Payer: Medicaid Other | Source: Ambulatory Visit | Attending: Obstetrics and Gynecology | Admitting: Obstetrics and Gynecology

## 2014-03-16 DIAGNOSIS — Z3A33 33 weeks gestation of pregnancy: Secondary | ICD-10-CM | POA: Diagnosis not present

## 2014-03-16 DIAGNOSIS — O24913 Unspecified diabetes mellitus in pregnancy, third trimester: Secondary | ICD-10-CM

## 2014-03-16 DIAGNOSIS — E119 Type 2 diabetes mellitus without complications: Secondary | ICD-10-CM | POA: Insufficient documentation

## 2014-03-16 DIAGNOSIS — O24113 Pre-existing diabetes mellitus, type 2, in pregnancy, third trimester: Secondary | ICD-10-CM | POA: Insufficient documentation

## 2014-03-16 NOTE — Progress Notes (Signed)
12/7 NST reviewed and reactive

## 2014-03-17 DIAGNOSIS — Z3A33 33 weeks gestation of pregnancy: Secondary | ICD-10-CM | POA: Insufficient documentation

## 2014-03-18 ENCOUNTER — Ambulatory Visit (INDEPENDENT_AMBULATORY_CARE_PROVIDER_SITE_OTHER): Payer: Medicaid Other | Admitting: Obstetrics and Gynecology

## 2014-03-18 ENCOUNTER — Encounter: Payer: Self-pay | Admitting: Obstetrics and Gynecology

## 2014-03-18 VITALS — BP 110/76 | HR 84 | Wt 176.8 lb

## 2014-03-18 DIAGNOSIS — Z3483 Encounter for supervision of other normal pregnancy, third trimester: Secondary | ICD-10-CM

## 2014-03-18 DIAGNOSIS — O289 Unspecified abnormal findings on antenatal screening of mother: Secondary | ICD-10-CM

## 2014-03-18 DIAGNOSIS — O24913 Unspecified diabetes mellitus in pregnancy, third trimester: Secondary | ICD-10-CM

## 2014-03-18 DIAGNOSIS — O280 Abnormal hematological finding on antenatal screening of mother: Secondary | ICD-10-CM

## 2014-03-18 DIAGNOSIS — O09293 Supervision of pregnancy with other poor reproductive or obstetric history, third trimester: Secondary | ICD-10-CM

## 2014-03-18 NOTE — Progress Notes (Signed)
Patient is doing well and is without complaints. She has not been taking glyburide as she was not checking CBGs appropriately. Upon review of the past 2 days, fasting remain in the 120's range. Advised to start taking glyburide at bedtime. 2 hr pp 120-135. Patient is not always adhering to diet. Discussed importance of euglycemia in the next 5 weeks of pregnancy NST reviewed and reactive

## 2014-03-22 ENCOUNTER — Telehealth: Payer: Self-pay | Admitting: *Deleted

## 2014-03-22 ENCOUNTER — Other Ambulatory Visit: Payer: Medicaid Other

## 2014-03-22 DIAGNOSIS — K219 Gastro-esophageal reflux disease without esophagitis: Secondary | ICD-10-CM

## 2014-03-22 MED ORDER — PANTOPRAZOLE SODIUM 20 MG PO TBEC: 20.0000 mg | DELAYED_RELEASE_TABLET | Freq: Every day | ORAL | Status: DC

## 2014-03-22 NOTE — Telephone Encounter (Signed)
Patient is needing something stronger to take for acid reflux.

## 2014-03-23 ENCOUNTER — Other Ambulatory Visit: Payer: Medicaid Other

## 2014-03-25 ENCOUNTER — Encounter: Payer: Medicaid Other | Admitting: Obstetrics & Gynecology

## 2014-03-26 ENCOUNTER — Encounter: Payer: Self-pay | Admitting: Obstetrics & Gynecology

## 2014-03-26 ENCOUNTER — Ambulatory Visit (INDEPENDENT_AMBULATORY_CARE_PROVIDER_SITE_OTHER): Payer: Medicaid Other | Admitting: Obstetrics & Gynecology

## 2014-03-26 VITALS — BP 110/74 | HR 88 | Wt 178.6 lb

## 2014-03-26 DIAGNOSIS — O24414 Gestational diabetes mellitus in pregnancy, insulin controlled: Secondary | ICD-10-CM

## 2014-03-26 DIAGNOSIS — O24410 Gestational diabetes mellitus in pregnancy, diet controlled: Secondary | ICD-10-CM

## 2014-03-26 DIAGNOSIS — Z3A34 34 weeks gestation of pregnancy: Secondary | ICD-10-CM

## 2014-03-26 DIAGNOSIS — O09893 Supervision of other high risk pregnancies, third trimester: Secondary | ICD-10-CM

## 2014-03-26 NOTE — Progress Notes (Signed)
Pt not taking Glyburide.  She reports that she is trying to control her glc with diet and exercise.  She has several abnormal GLC levels including a 174 and several in the high 130's to 150's.    She reports that she is scared that if she begins the meds she will not be able to 'get off of them later'    I spent time reviewing GDM with pt and her risk of fetal birth injury and shoulder dystocia with uncontrolled glc. I also educated her about the subsequent risk of DM later in life with no long term lifestyle changes. Pt educated about 2 hour GTT needed PP.  She did not commit to taking meds. NST reviewed and reactive.

## 2014-03-26 NOTE — Progress Notes (Signed)
Patient missed appointments Monday and Tuesday for her NST due to her child was ill.  She is feeling increased pelvic pressure and having some tightenings. NST is reviewed and reactive AFI is 26.3cm

## 2014-03-26 NOTE — Patient Instructions (Signed)
Gestational Diabetes Mellitus Gestational diabetes mellitus, often simply referred to as gestational diabetes, is a type of diabetes that some women develop during pregnancy. In gestational diabetes, the pancreas does not make enough insulin (a hormone), the cells are less responsive to the insulin that is made (insulin resistance), or both.Normally, insulin moves sugars from food into the tissue cells. The tissue cells use the sugars for energy. The lack of insulin or the lack of normal response to insulin causes excess sugars to build up in the blood instead of going into the tissue cells. As a result, high blood sugar (hyperglycemia) develops. The effect of high sugar (glucose) levels can cause many problems.  RISK FACTORS You have an increased chance of developing gestational diabetes if you have a family history of diabetes and also have one or more of the following risk factors:  A body mass index over 30 (obesity).  A previous pregnancy with gestational diabetes.  An older age at the time of pregnancy. If blood glucose levels are kept in the normal range during pregnancy, women can have a healthy pregnancy. If your blood glucose levels are not well controlled, there may be risks to you, your unborn baby (fetus), your labor and delivery, or your newborn baby.  SYMPTOMS  If symptoms are experienced, they are much like symptoms you would normally expect during pregnancy. The symptoms of gestational diabetes include:   Increased thirst (polydipsia).  Increased urination (polyuria).  Increased urination during the night (nocturia).  Weight loss. This weight loss may be rapid.  Frequent, recurring infections.  Tiredness (fatigue).  Weakness.  Vision changes, such as blurred vision.  Fruity smell to your breath.  Abdominal pain. DIAGNOSIS Diabetes is diagnosed when blood glucose levels are increased. Your blood glucose level may be checked by one or more of the following blood  tests:  A fasting blood glucose test. You will not be allowed to eat for at least 8 hours before a blood sample is taken.  A random blood glucose test. Your blood glucose is checked at any time of the day regardless of when you ate.  A hemoglobin A1c blood glucose test. A hemoglobin A1c test provides information about blood glucose control over the previous 3 months.  An oral glucose tolerance test (OGTT). Your blood glucose is measured after you have not eaten (fasted) for 1-3 hours and then after you drink a glucose-containing beverage. Since the hormones that cause insulin resistance are highest at about 24-28 weeks of a pregnancy, an OGTT is usually performed during that time. If you have risk factors for gestational diabetes, your health care provider may test you for gestational diabetes earlier than 24 weeks of pregnancy. TREATMENT   You will need to take diabetes medicine or insulin daily to keep blood glucose levels in the desired range.  You will need to match insulin dosing with exercise and healthy food choices. The treatment goal is to maintain the before-meal (preprandial), bedtime, and overnight blood glucose level at 60-99 mg/dL during pregnancy. The treatment goal is to further maintain peak after-meal blood sugar (postprandial glucose) level at 100-140 mg/dL. HOME CARE INSTRUCTIONS   Have your hemoglobin A1c level checked twice a year.  Perform daily blood glucose monitoring as directed by your health care provider. It is common to perform frequent blood glucose monitoring.  Monitor urine ketones when you are ill and as directed by your health care provider.  Take your diabetes medicine and insulin as directed by your health care provider   to maintain your blood glucose level in the desired range.  Never run out of diabetes medicine or insulin. It is needed every day.  Adjust insulin based on your intake of carbohydrates. Carbohydrates can raise blood glucose levels but  need to be included in your diet. Carbohydrates provide vitamins, minerals, and fiber which are an essential part of a healthy diet. Carbohydrates are found in fruits, vegetables, whole grains, dairy products, legumes, and foods containing added sugars.  Eat healthy foods. Alternate 3 meals with 3 snacks.  Maintain a healthy weight gain. The usual total expected weight gain varies according to your prepregnancy body mass index (BMI).  Carry a medical alert card or wear your medical alert jewelry.  Carry a 15-gram carbohydrate snack with you at all times to treat low blood glucose (hypoglycemia). Some examples of 15-gram carbohydrate snacks include:  Glucose tablets, 3 or 4.  Glucose gel, 15-gram tube.  Raisins, 2 tablespoons (24 g).  Jelly beans, 6.  Animal crackers, 8.  Fruit juice, regular soda, or low-fat milk, 4 ounces (120 mL).  Gummy treats, 9.  Recognize hypoglycemia. Hypoglycemia during pregnancy occurs with blood glucose levels of 60 mg/dL and below. The risk for hypoglycemia increases when fasting or skipping meals, during or after intense exercise, and during sleep. Hypoglycemia symptoms can include:  Tremors or shakes.  Decreased ability to concentrate.  Sweating.  Increased heart rate.  Headache.  Dry mouth.  Hunger.  Irritability.  Anxiety.  Restless sleep.  Altered speech or coordination.  Confusion.  Treat hypoglycemia promptly. If you are alert and able to safely swallow, follow the 15:15 rule:  Take 15-20 grams of rapid-acting glucose or carbohydrate. Rapid-acting options include glucose gel, glucose tablets, or 4 ounces (120 mL) of fruit juice, regular soda, or low-fat milk.  Check your blood glucose level 15 minutes after taking the glucose.  Take 15-20 grams more of glucose if the repeat blood glucose level is still 70 mg/dL or below.  Eat a meal or snack within 1 hour once blood glucose levels return to normal.  Be alert to polyuria  (excess urination) and polydipsia (excess thirst) which are early signs of hyperglycemia. An early awareness of hyperglycemia allows for prompt treatment. Treat hyperglycemia as directed by your health care provider.  Engage in at least 30 minutes of physical activity a day or as directed by your health care provider. Ten minutes of physical activity timed 30 minutes after each meal is encouraged to control postprandial blood glucose levels.  Adjust your insulin dosing and food intake as needed if you start a new exercise or sport.  Follow your sick-day plan at any time you are unable to eat or drink as usual.  Avoid tobacco and alcohol use.  Keep all follow-up visits as directed by your health care provider.  Follow the advice of your health care provider regarding your prenatal and post-delivery (postpartum) appointments, meal planning, exercise, medicines, vitamins, blood tests, other medical tests, and physical activities.  Perform daily skin and foot care. Examine your skin and feet daily for cuts, bruises, redness, nail problems, bleeding, blisters, or sores.  Brush your teeth and gums at least twice a day and floss at least once a day. Follow up with your dentist regularly.  Schedule an eye exam during the first trimester of your pregnancy or as directed by your health care provider.  Share your diabetes management plan with your workplace or school.  Stay up-to-date with immunizations.  Learn to manage stress.    Obtain ongoing diabetes education and support as needed.  Learn about and consider breastfeeding your baby.  You should have your blood sugar level checked 6-12 weeks after delivery. This is done with an oral glucose tolerance test (OGTT). SEEK MEDICAL CARE IF:   You are unable to eat food or drink fluids for more than 6 hours.  You have nausea and vomiting for more than 6 hours.  You have a blood glucose level of 200 mg/dL and you have ketones in your  urine.  There is a change in mental status.  You develop vision problems.  You have a persistent headache.  You have upper abdominal pain or discomfort.  You develop an additional serious illness.  You have diarrhea for more than 6 hours.  You have been sick or have had a fever for a couple of days and are not getting better. SEEK IMMEDIATE MEDICAL CARE IF:   You have difficulty breathing.  You no longer feel the baby moving.  You are bleeding or have discharge from your vagina.  You start having premature contractions or labor. MAKE SURE YOU:  Understand these instructions.  Will watch your condition.  Will get help right away if you are not doing well or get worse. Document Released: 07/02/2000 Document Revised: 08/10/2013 Document Reviewed: 10/23/2011 [employer] Patient Information 2015 [employer]. This information is not intended to replace advice given to you by your health care provider. Make sure you discuss any questions you have with your health care provider.  

## 2014-03-29 ENCOUNTER — Encounter: Payer: Medicaid Other | Admitting: Obstetrics and Gynecology

## 2014-03-30 ENCOUNTER — Encounter: Payer: Medicaid Other | Admitting: Family Medicine

## 2014-04-07 ENCOUNTER — Ambulatory Visit (INDEPENDENT_AMBULATORY_CARE_PROVIDER_SITE_OTHER): Payer: Medicaid Other | Admitting: Obstetrics & Gynecology

## 2014-04-07 ENCOUNTER — Encounter: Payer: Self-pay | Admitting: Obstetrics & Gynecology

## 2014-04-07 VITALS — BP 118/82 | HR 76 | Wt 182.0 lb

## 2014-04-07 DIAGNOSIS — E119 Type 2 diabetes mellitus without complications: Secondary | ICD-10-CM

## 2014-04-07 DIAGNOSIS — Z36 Encounter for antenatal screening of mother: Secondary | ICD-10-CM

## 2014-04-07 DIAGNOSIS — O24913 Unspecified diabetes mellitus in pregnancy, third trimester: Secondary | ICD-10-CM

## 2014-04-07 DIAGNOSIS — Z3483 Encounter for supervision of other normal pregnancy, third trimester: Secondary | ICD-10-CM

## 2014-04-07 LAB — OB RESULTS CONSOLE GC/CHLAMYDIA
Chlamydia: NEGATIVE
GC PROBE AMP, GENITAL: NEGATIVE

## 2014-04-07 LAB — OB RESULTS CONSOLE GBS: STREP GROUP B AG: NEGATIVE

## 2014-04-07 NOTE — Progress Notes (Signed)
Cervical cx and GBS obtained today Glc log reviewed and all glc WNL  Pt for IOL 1/19. [facility]  Pt to leave paperwork at front desk for nurses to complete No problems today   NST reviewed and reactive.  Traci [name] L. Harraway-Smith, M.D., Evern CoreFACOG

## 2014-04-07 NOTE — Addendum Note (Signed)
Addended by: Tandy GawHINTON, Sudeep Scheibel C on: 04/07/2014 10:18 AM   Modules accepted: Orders

## 2014-04-07 NOTE — Patient Instructions (Signed)
Third Trimester of Pregnancy The third trimester is from week 29 through week 42, months 7 through 9. The third trimester is a time when the fetus is growing rapidly. At the end of the ninth month, the fetus is about 20 inches in length and weighs 6-10 pounds.  BODY CHANGES Your body goes through many changes during pregnancy. The changes vary from woman to woman.   Your weight will continue to increase. You can expect to gain 25-35 pounds (11-16 kg) by the end of the pregnancy.  You may begin to get stretch marks on your hips, abdomen, and breasts.  You may urinate more often because the fetus is moving lower into your pelvis and pressing on your bladder.  You may develop or continue to have heartburn as a result of your pregnancy.  You may develop constipation because certain hormones are causing the muscles that push waste through your intestines to slow down.  You may develop hemorrhoids or swollen, bulging veins (varicose veins).  You may have pelvic pain because of the weight gain and pregnancy hormones relaxing your joints between the bones in your pelvis. Backaches may result from overexertion of the muscles supporting your posture.  You may have changes in your hair. These can include thickening of your hair, rapid growth, and changes in texture. Some women also have hair loss during or after pregnancy, or hair that feels dry or thin. Your hair will most likely return to normal after your baby is born.  Your breasts will continue to grow and be tender. A yellow discharge may leak from your breasts called colostrum.  Your belly button may stick out.  You may feel short of breath because of your expanding uterus.  You may notice the fetus "dropping," or moving lower in your abdomen.  You may have a bloody mucus discharge. This usually occurs a few days to a week before labor begins.  Your cervix becomes thin and soft (effaced) near your due date. WHAT TO EXPECT AT YOUR PRENATAL  EXAMS  You will have prenatal exams every 2 weeks until week 36. Then, you will have weekly prenatal exams. During a routine prenatal visit:  You will be weighed to make sure you and the fetus are growing normally.  Your blood pressure is taken.  Your abdomen will be measured to track your baby's growth.  The fetal heartbeat will be listened to.  Any test results from the previous visit will be discussed.  You may have a cervical check near your due date to see if you have effaced. At around 36 weeks, your caregiver will check your cervix. At the same time, your caregiver will also perform a test on the secretions of the vaginal tissue. This test is to determine if a type of bacteria, Group B streptococcus, is present. Your caregiver will explain this further. Your caregiver may ask you:  What your birth plan is.  How you are feeling.  If you are feeling the baby move.  If you have had any abnormal symptoms, such as leaking fluid, bleeding, severe headaches, or abdominal cramping.  If you have any questions. Other tests or screenings that may be performed during your third trimester include:  Blood tests that check for low iron levels (anemia).  Fetal testing to check the health, activity level, and growth of the fetus. Testing is done if you have certain medical conditions or if there are problems during the pregnancy. FALSE LABOR You may feel small, irregular contractions that   eventually go away. These are called Braxton Hicks contractions, or false labor. Contractions may last for hours, days, or even weeks before true labor sets in. If contractions come at regular intervals, intensify, or become painful, it is best to be seen by your caregiver.  SIGNS OF LABOR   Menstrual-like cramps.  Contractions that are 5 minutes apart or less.  Contractions that start on the top of the uterus and spread down to the lower abdomen and back.  A sense of increased pelvic pressure or back  pain.  A watery or bloody mucus discharge that comes from the vagina. If you have any of these signs before the 37th week of pregnancy, call your caregiver right away. You need to go to the hospital to get checked immediately. HOME CARE INSTRUCTIONS   Avoid all smoking, herbs, alcohol, and unprescribed drugs. These chemicals affect the formation and growth of the baby.  Follow your caregiver's instructions regarding medicine use. There are medicines that are either safe or unsafe to take during pregnancy.  Exercise only as directed by your caregiver. Experiencing uterine cramps is a good sign to stop exercising.  Continue to eat regular, healthy meals.  Wear a good support bra for breast tenderness.  Do not use hot tubs, steam rooms, or saunas.  Wear your seat belt at all times when driving.  Avoid raw meat, uncooked cheese, cat litter boxes, and soil used by cats. These carry germs that can cause birth defects in the baby.  Take your prenatal vitamins.  Try taking a stool softener (if your caregiver approves) if you develop constipation. Eat more high-fiber foods, such as fresh vegetables or fruit and whole grains. Drink plenty of fluids to keep your urine clear or pale yellow.  Take warm sitz baths to soothe any pain or discomfort caused by hemorrhoids. Use hemorrhoid cream if your caregiver approves.  If you develop varicose veins, wear support hose. Elevate your feet for 15 minutes, 3-4 times a day. Limit salt in your diet.  Avoid heavy lifting, wear low heal shoes, and practice good posture.  Rest a lot with your legs elevated if you have leg cramps or low back pain.  Visit your dentist if you have not gone during your pregnancy. Use a soft toothbrush to brush your teeth and be gentle when you floss.  A sexual relationship may be continued unless your caregiver directs you otherwise.  Do not travel far distances unless it is absolutely necessary and only with the approval  of your caregiver.  Take prenatal classes to understand, practice, and ask questions about the labor and delivery.  Make a trial run to the hospital.  Pack your hospital bag.  Prepare the baby's nursery.  Continue to go to all your prenatal visits as directed by your caregiver. SEEK MEDICAL CARE IF:  You are unsure if you are in labor or if your water has broken.  You have dizziness.  You have mild pelvic cramps, pelvic pressure, or nagging pain in your abdominal area.  You have persistent nausea, vomiting, or diarrhea.  You have a bad smelling vaginal discharge.  You have pain with urination. SEEK IMMEDIATE MEDICAL CARE IF:   You have a fever.  You are leaking fluid from your vagina.  You have spotting or bleeding from your vagina.  You have severe abdominal cramping or pain.  You have rapid weight loss or gain.  You have shortness of breath with chest pain.  You notice sudden or extreme swelling   of your face, hands, ankles, feet, or legs.  You have not felt your baby move in over an hour.  You have severe headaches that do not go away with medicine.  You have vision changes. Document Released: 03/20/2001 Document Revised: 03/31/2013 Document Reviewed: 05/27/2012 [employer] Patient Information 2015 [employer]. This information is not intended to replace advice given to you by your health care provider. Make sure you discuss any questions you have with your health care provider.  

## 2014-04-07 NOTE — Addendum Note (Signed)
Addended by: Tandy GawHINTON, Ashyr Hedgepath C on: 04/07/2014 11:57 AM   Modules accepted: Orders

## 2014-04-08 LAB — GC/CHLAMYDIA PROBE AMP
CT Probe RNA: NEGATIVE
GC PROBE AMP APTIMA: NEGATIVE

## 2014-04-09 NOTE — L&D Delivery Note (Signed)
Patient is 24 y.o. 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  APGAR: 8, 9; weight  .   Placenta status: Intact, Spontaneous.  Cord: 3 vessels with the following complications: None.  Anesthesia: Epidural  Episiotomy: None Lacerations:  none Suture Repair: n/a Est. Blood Loss (mL):  250mL  Lower uterine segment not firm, methergine given, no clots, no cervical lacerations.  Still had slow trickle, 1000mcg cytotec given PR.  No true postpartum hemorrhage.  Mom to postpartum.  Baby to Couplet care / Skin to Skin.  Traci [name] 04/27/2014, 8:57 AM

## 2014-04-10 LAB — CULTURE, BETA STREP (GROUP B ONLY)

## 2014-04-12 ENCOUNTER — Ambulatory Visit (INDEPENDENT_AMBULATORY_CARE_PROVIDER_SITE_OTHER): Payer: Medicaid Other | Admitting: *Deleted

## 2014-04-12 DIAGNOSIS — O24419 Gestational diabetes mellitus in pregnancy, unspecified control: Secondary | ICD-10-CM

## 2014-04-15 ENCOUNTER — Ambulatory Visit (INDEPENDENT_AMBULATORY_CARE_PROVIDER_SITE_OTHER): Payer: Medicaid Other | Admitting: Obstetrics & Gynecology

## 2014-04-15 VITALS — BP 114/69 | HR 70 | Wt 182.0 lb

## 2014-04-15 DIAGNOSIS — O24410 Gestational diabetes mellitus in pregnancy, diet controlled: Secondary | ICD-10-CM

## 2014-04-15 DIAGNOSIS — Z3483 Encounter for supervision of other normal pregnancy, third trimester: Secondary | ICD-10-CM

## 2014-04-15 NOTE — Progress Notes (Signed)
Routine visit. Good FM. No problems. Excellent sugars. Labor precautions reviewed.

## 2014-04-19 ENCOUNTER — Other Ambulatory Visit: Payer: Medicaid Other

## 2014-04-22 ENCOUNTER — Ambulatory Visit (INDEPENDENT_AMBULATORY_CARE_PROVIDER_SITE_OTHER): Payer: Medicaid Other | Admitting: Obstetrics & Gynecology

## 2014-04-22 ENCOUNTER — Encounter: Payer: Self-pay | Admitting: Obstetrics & Gynecology

## 2014-04-22 VITALS — BP 116/79 | HR 81 | Wt 183.6 lb

## 2014-04-22 DIAGNOSIS — O24913 Unspecified diabetes mellitus in pregnancy, third trimester: Secondary | ICD-10-CM

## 2014-04-22 DIAGNOSIS — O09293 Supervision of pregnancy with other poor reproductive or obstetric history, third trimester: Secondary | ICD-10-CM

## 2014-04-22 DIAGNOSIS — E119 Type 2 diabetes mellitus without complications: Secondary | ICD-10-CM

## 2014-04-22 DIAGNOSIS — O0993 Supervision of high risk pregnancy, unspecified, third trimester: Secondary | ICD-10-CM

## 2014-04-22 NOTE — Patient Instructions (Signed)
Return to clinic for any obstetric concerns or go to [facility] for evaluation Labor Induction  Labor induction is when steps are taken to cause a pregnant woman to begin the labor process. Most women go into labor on their own between 37 weeks and 42 weeks of the pregnancy. When this does not happen or when there is a medical need, methods may be used to induce labor. Labor induction causes a pregnant woman's uterus to contract. It also causes the cervix to soften (ripen), open (dilate), and thin out (efface). Usually, labor is not induced before 39 weeks of the pregnancy unless there is a problem with the baby or mother.  Before inducing labor, your health care provider will consider a number of factors, including the following:  The medical condition of you and the baby.   How many weeks along you are.   The status of the baby's lung maturity.   The condition of the cervix.   The position of the baby.  WHAT ARE THE REASONS FOR LABOR INDUCTION? Labor may be induced for the following reasons:  The health of the baby or mother is at risk.   The pregnancy is overdue by 1 week or more.   The water breaks but labor does not start on its own.   The mother has a health condition or serious illness, such as high blood pressure, infection, placental abruption, or diabetes.  The amniotic fluid amounts are low around the baby.   The baby is distressed.  Convenience or wanting the baby to be born on a certain date is not a reason for inducing labor. WHAT METHODS ARE USED FOR LABOR INDUCTION? Several methods of labor induction may be used, such as:   Prostaglandin medicine. This medicine causes the cervix to dilate and ripen. The medicine will also start contractions. It can be taken by mouth or by inserting a suppository into the vagina.   Inserting a thin tube (catheter) with a balloon on the end into the vagina to dilate the cervix. Once inserted, the balloon is expanded with water,  which causes the cervix to open.   Stripping the membranes. Your health care provider separates amniotic sac tissue from the cervix, causing the cervix to be stretched and causing the release of a hormone called progesterone. This may cause the uterus to contract. It is often done during an office visit. You will be sent home to wait for the contractions to begin. You will then come in for an induction.   Breaking the water. Your health care provider makes a hole in the amniotic sac using a small instrument. Once the amniotic sac breaks, contractions should begin. This may still take hours to see an effect.   Medicine to trigger or strengthen contractions. This medicine is given through an IV access tube inserted into a vein in your arm.  All of the methods of induction, besides stripping the membranes, will be done in the hospital. Induction is done in the hospital so that you and the baby can be carefully monitored.  HOW LONG DOES IT TAKE FOR LABOR TO BE INDUCED? Some inductions can take up to 2-3 days. Depending on the cervix, it usually takes less time. It takes longer when you are induced early in the pregnancy or if this is your first pregnancy. If a mother is still pregnant and the induction has been going on for 2-3 days, either the mother will be sent home or a cesarean delivery will be needed. WHAT  ARE THE RISKS ASSOCIATED WITH LABOR INDUCTION? Some of the risks of induction include:   Changes in fetal heart rate, such as too high, too low, or erratic.   Fetal distress.   Chance of infection for the mother and baby.   Increased chance of having a cesarean delivery.   Breaking off (abruption) of the placenta from the uterus (rare).   Uterine rupture (very rare).  When induction is needed for medical reasons, the benefits of induction may outweigh the risks. WHAT ARE SOME REASONS FOR NOT INDUCING LABOR? Labor induction should not be done if:   It is shown that your baby  does not tolerate labor.   You have had previous surgeries on your uterus, such as a myomectomy or the removal of fibroids.   Your placenta lies very low in the uterus and blocks the opening of the cervix (placenta previa).   Your baby is not in a head-down position.   The umbilical cord drops down into the birth canal in front of the baby. This could cut off the baby's blood and oxygen supply.   You have had a previous cesarean delivery.   There are unusual circumstances, such as the baby being extremely premature.  Document Released: 08/15/2006 Document Revised: 11/26/2012 Document Reviewed: 10/23/2012 [facility] Patient Information 2015 [place], [place]. This information is not intended to replace advice given to you by your health care provider. Make sure you discuss any questions you have with your health care provider.

## 2014-04-22 NOTE — Progress Notes (Signed)
Two abnormal fasting blood sugars (100, 102) but the rest and postprandials are within range NST performed today was reviewed and was found to be reactive.  Growth scan and BPP scheduled for 04/26/14; IOL 04/27/14 (direct admit to L&D) No other complaints or concerns.  Labor and fetal movement precautions reviewed.

## 2014-04-26 ENCOUNTER — Encounter (HOSPITAL_COMMUNITY): Payer: Self-pay | Admitting: *Deleted

## 2014-04-26 ENCOUNTER — Inpatient Hospital Stay (EMERGENCY_DEPARTMENT_HOSPITAL)
Admission: AD | Admit: 2014-04-26 | Discharge: 2014-04-26 | Disposition: A | Discharge status: Home / Self Care | Payer: Medicaid Other | Source: Ambulatory Visit | Attending: Obstetrics & Gynecology | Admitting: Obstetrics & Gynecology

## 2014-04-26 ENCOUNTER — Inpatient Hospital Stay (HOSPITAL_COMMUNITY): Payer: Medicaid Other

## 2014-04-26 ENCOUNTER — Ambulatory Visit (HOSPITAL_COMMUNITY): Payer: Medicaid Other

## 2014-04-26 DIAGNOSIS — O280 Abnormal hematological finding on antenatal screening of mother: Secondary | ICD-10-CM

## 2014-04-26 DIAGNOSIS — O26893 Other specified pregnancy related conditions, third trimester: Secondary | ICD-10-CM

## 2014-04-26 DIAGNOSIS — O24913 Unspecified diabetes mellitus in pregnancy, third trimester: Secondary | ICD-10-CM

## 2014-04-26 DIAGNOSIS — Z3A38 38 weeks gestation of pregnancy: Secondary | ICD-10-CM

## 2014-04-26 DIAGNOSIS — O368390 Maternal care for abnormalities of the fetal heart rate or rhythm, unspecified trimester, not applicable or unspecified: Secondary | ICD-10-CM

## 2014-04-26 DIAGNOSIS — O09293 Supervision of pregnancy with other poor reproductive or obstetric history, third trimester: Secondary | ICD-10-CM

## 2014-04-26 DIAGNOSIS — Z3A39 39 weeks gestation of pregnancy: Secondary | ICD-10-CM | POA: Diagnosis present

## 2014-04-26 DIAGNOSIS — Z79899 Other long term (current) drug therapy: Secondary | ICD-10-CM

## 2014-04-26 DIAGNOSIS — O368190 Decreased fetal movements, unspecified trimester, not applicable or unspecified: Secondary | ICD-10-CM | POA: Insufficient documentation

## 2014-04-26 DIAGNOSIS — N898 Other specified noninflammatory disorders of vagina: Secondary | ICD-10-CM

## 2014-04-26 DIAGNOSIS — O24429 Gestational diabetes mellitus in childbirth, unspecified control: Secondary | ICD-10-CM | POA: Diagnosis present

## 2014-04-26 HISTORY — DX: Gestational diabetes mellitus in pregnancy, unspecified control: O24.419

## 2014-04-26 LAB — POCT FERN TEST: POCT Fern Test: NEGATIVE

## 2014-04-26 NOTE — Addendum Note (Signed)
Encounter addended by: Debbrah AlarJennifer Irene Burnadette Baskett, NP on: 04/26/2014  6:05 PM<BR>     Documentation filed: Clinical Notes

## 2014-04-26 NOTE — [facility] Note (Signed)
Pt reports ? ROM at 0400, started having contractions.

## 2014-04-26 NOTE — MAU Provider Note (Addendum)
Chief Complaint:  Rupture of Membranes and Contractions   First Provider Initiated Contact with Patient 04/26/14 [phone]     HPI: Traci [name] is a 24 y.o. G3P2002 at [redacted]w[redacted]d who presents to maternity admissions for a r/o ROM. Patient states she woke up this morning and her panties were wet. Patient states her bed sheets were NOT wet or damp this morning.She also reports some increased drainage upon getting up to clean herself up. Some mild blood streaking noted. Contractions have also increased slightly, but not much.   Denies rush of fluid or vaginal bleeding. Good fetal movement.   Pregnancy Course:  [place]  Dating 12 wk U/S  Genetic Screen  Quad abnormal with increased ROD 1:85          NIPS: Low Risk  Anatomic [place] Normal  GTT Early: 159 3 hr GTT-vomited-nml fasting and HgbA1C 5.6   Third trimester:   TDaP vaccine 02/01/14  Flu vaccine 02/01/14  GBS negative  Contraception  undecided  Baby Food  breast  Circumcision   Pediatrician   Support Person     Past Medical History: Past Medical History  Diagnosis Date  . Hernia   . Gestational diabetes   . Pregnancy induced hypertension     Past obstetric history: OB History  Gravida Para Term Preterm AB SAB TAB Ectopic Multiple Living  $Remov'3 2 2       2    'CwIVsv$ # Outcome Date GA Lbr Len/2nd Weight Sex Delivery Anes PTL Lv  3 Current           2 Term 02/27/12 [redacted]w[redacted]d 06:48 / 01:04 2.912 kg (6 lb 6.7 oz) M Vag-Spont EPI  Y     Comments: WNL  1 Term 2011 [redacted]w[redacted]d 12:00 2.778 kg (6 lb 2 oz) M Vag-Spont EPI  Y      Past Surgical History: Past Surgical History  Procedure Laterality Date  . Tooth extaction       Family History: Family History  Problem Relation Age of Onset  . Diabetes Maternal Grandmother     Social History: History  Substance Use Topics  . Smoking status: Never Smoker   . Smokeless tobacco: Never Used  . Alcohol Use: No    Allergies: No Known Allergies  Meds:  Prescriptions prior to admission   Medication Sig Dispense Refill Last Dose  . acetaminophen (TYLENOL) 325 MG tablet Take 650 mg by mouth every 6 (six) hours as needed for headache.   Past Month at Unknown time  . Blood Glucose Monitoring Suppl (ACCU-CHEK NANO SMARTVIEW) W/DEVICE KIT 1 each by Does not apply route 4 (four) times daily. 1 kit 11 04/25/2014 at Unknown time  . glucose blood (ACCU-CHEK SMARTVIEW) test strip Use as instructed checking blood sugar levels 4 times daily. 100 each 12 04/25/2014 at Unknown time  . glyBURIDE (DIABETA) 2.5 MG tablet Take 1 tablet (2.5 mg total) by mouth at bedtime. 30 tablet 3 04/25/2014 at Unknown time  . Lancets Misc. (ACCU-CHEK FASTCLIX LANCET) KIT 1 each by Does not apply route 4 (four) times daily. 1 kit 11 04/25/2014 at Unknown time  . pantoprazole (PROTONIX) 20 MG tablet Take 1 tablet (20 mg total) by mouth daily. 30 tablet 11 Past Week at Unknown time  . Prenatal Vit-Fe Fumarate-FA (PRENATAL MULTIVITAMIN) TABS tablet Take 1 tablet by mouth daily. 30 tablet 10 04/25/2014 at Unknown time  . aspirin EC 81 MG tablet Take 1 tablet (81 mg total) by mouth daily. 30 tablet  10 More than a month at Unknown time  . ondansetron (ZOFRAN) 4 MG tablet Take 1 tablet (4 mg total) by mouth every 8 (eight) hours as needed for nausea or vomiting. 10 tablet 0 More than a month at Unknown time  . promethazine (PHENERGAN) 25 MG tablet Take 1 tablet (25 mg total) by mouth every 6 (six) hours as needed for nausea or vomiting. 30 tablet 1 More than a month at Unknown time    ROS: Pertinent findings in history of present illness.  Physical Exam  Blood pressure 118/76, pulse 72, temperature 98.2 F (36.8 C), temperature source Oral, resp. rate 16, height 5' 3.5" (1.613 m), weight 85.276 kg (188 lb), last menstrual period 08/06/2013, SpO2 100 %, unknown if currently breastfeeding. GENERAL: Well-developed, well-nourished female in no acute distress.  HEENT: normocephalic HEART: normal rate RESP: normal  effort ABDOMEN: Soft, non-tender, gravid appropriate for gestational age EXTREMITIES: Nontender, no edema NEURO: alert and oriented SPECULUM EXAM: NEFG, physiologic discharge, no blood, cervix clean; NO pooling    FHT:  Baseline 150, moderate variability, accelerations present, no decelerations Contractions: irreg   Labs: Results for orders placed or performed during the hospital encounter of 04/26/14 (from the past 24 hour(s))  Fern Test     Status: None   Collection Time: 04/26/14  6:33 AM  Result Value Ref Range   POCT Fern Test Negative = intact amniotic membranes     Imaging:  No results found.  Assessment: 1. Abnormal quad screen   2. Diabetes mellitus in pregnancy in third trimester   3. Fern neg  Plan: Discharge home Labor precautions and fetal kick counts    Medication List    ASK your doctor about these medications        ACCU-CHEK FASTCLIX LANCET Kit  1 each by Does not apply route 4 (four) times daily.     ACCU-CHEK NANO SMARTVIEW W/DEVICE Kit  1 each by Does not apply route 4 (four) times daily.     acetaminophen 325 MG tablet  Commonly known as:  TYLENOL  Take 650 mg by mouth every 6 (six) hours as needed for headache.     aspirin EC 81 MG tablet  Take 1 tablet (81 mg total) by mouth daily.     glucose blood test strip  Commonly known as:  ACCU-CHEK SMARTVIEW  Use as instructed checking blood sugar levels 4 times daily.     glyBURIDE 2.5 MG tablet  Commonly known as:  DIABETA  Take 1 tablet (2.5 mg total) by mouth at bedtime.     ondansetron 4 MG tablet  Commonly known as:  ZOFRAN  Take 1 tablet (4 mg total) by mouth every 8 (eight) hours as needed for nausea or vomiting.     pantoprazole 20 MG tablet  Commonly known as:  PROTONIX  Take 1 tablet (20 mg total) by mouth daily.     prenatal multivitamin Tabs tablet  Take 1 tablet by mouth daily.     promethazine 25 MG tablet  Commonly known as:  PHENERGAN  Take 1 tablet (25 mg total) by  mouth every 6 (six) hours as needed for nausea or vomiting.        Elberta Leatherwood, MD 04/26/2014 7:06 AM  I was asked to review the fetal tracing and send the patient home if reactive.  Fetal tracing shows quick variables; patient is scheduled for BPP and  And US OB follow up today. Will send patient from MAU now. Patient  is scheduled for IOL tomorrow.   NST 10/10 Polyhydramnios noted on [place]   US findings discussed with Dr. Nehemiah Settle. I will discharge home and have patient keep your Induction scheduled for tomorrow. I have reviewed kick counts and labor precautions with the patient who voices understanding.   Darrelyn Hillock Rasch, NP 04/26/2014 6:05 PM

## 2014-04-27 ENCOUNTER — Inpatient Hospital Stay (HOSPITAL_COMMUNITY): Payer: Medicaid Other | Admitting: Anesthesiology

## 2014-04-27 ENCOUNTER — Encounter (HOSPITAL_COMMUNITY): Payer: Self-pay | Admitting: *Deleted

## 2014-04-27 ENCOUNTER — Inpatient Hospital Stay (HOSPITAL_COMMUNITY)
Admission: AD | Admit: 2014-04-27 | Discharge: 2014-04-29 | DRG: 775 | Disposition: A | Discharge status: Home / Self Care | Payer: Medicaid Other | Source: Ambulatory Visit | Attending: Family Medicine | Admitting: Family Medicine

## 2014-04-27 DIAGNOSIS — O24419 Gestational diabetes mellitus in pregnancy, unspecified control: Secondary | ICD-10-CM | POA: Diagnosis present

## 2014-04-27 DIAGNOSIS — O09293 Supervision of pregnancy with other poor reproductive or obstetric history, third trimester: Secondary | ICD-10-CM

## 2014-04-27 DIAGNOSIS — Z3A39 39 weeks gestation of pregnancy: Secondary | ICD-10-CM

## 2014-04-27 DIAGNOSIS — O24913 Unspecified diabetes mellitus in pregnancy, third trimester: Secondary | ICD-10-CM

## 2014-04-27 DIAGNOSIS — O24424 Gestational diabetes mellitus in childbirth, insulin controlled: Secondary | ICD-10-CM

## 2014-04-27 DIAGNOSIS — IMO0001 Reserved for inherently not codable concepts without codable children: Secondary | ICD-10-CM

## 2014-04-27 DIAGNOSIS — Z79899 Other long term (current) drug therapy: Secondary | ICD-10-CM | POA: Diagnosis not present

## 2014-04-27 DIAGNOSIS — O280 Abnormal hematological finding on antenatal screening of mother: Secondary | ICD-10-CM

## 2014-04-27 DIAGNOSIS — O24429 Gestational diabetes mellitus in childbirth, unspecified control: Secondary | ICD-10-CM | POA: Diagnosis present

## 2014-04-27 DIAGNOSIS — O0993 Supervision of high risk pregnancy, unspecified, third trimester: Secondary | ICD-10-CM

## 2014-04-27 LAB — CBC
HCT: 37.6 % (ref 36.0–46.0)
HEMOGLOBIN: 12.8 g/dL (ref 12.0–15.0)
MCH: 28.3 pg (ref 26.0–34.0)
MCHC: 34 g/dL (ref 30.0–36.0)
MCV: 83.2 fL (ref 78.0–100.0)
Platelets: 155 10*3/uL (ref 150–400)
RBC: 4.52 MIL/uL (ref 3.87–5.11)
RDW: 15.1 % (ref 11.5–15.5)
WBC: 12 10*3/uL — AB (ref 4.0–10.5)

## 2014-04-27 LAB — TYPE AND SCREEN
ABO/RH(D): B POS
Antibody Screen: NEGATIVE

## 2014-04-27 LAB — GLUCOSE, RANDOM: GLUCOSE: 148 mg/dL — AB (ref 70–99)

## 2014-04-27 LAB — ABO/RH: ABO/RH(D): B POS

## 2014-04-27 LAB — GLUCOSE, CAPILLARY: GLUCOSE-CAPILLARY: 123 mg/dL — AB (ref 70–99)

## 2014-04-27 MED ORDER — PHENYLEPHRINE 40 MCG/ML (10ML) SYRINGE FOR IV PUSH (FOR BLOOD PRESSURE SUPPORT)
PREFILLED_SYRINGE | INTRAVENOUS | Status: AC
  Administered 2014-04-27: 80 ug via INTRAVENOUS
  Filled 2014-04-27: qty 20

## 2014-04-27 MED ORDER — SODIUM CHLORIDE 0.9 % IV SOLN: 250.0000 mL | INTRAVENOUS | Status: DC | PRN

## 2014-04-27 MED ORDER — DIBUCAINE 1 % RE OINT: 1.0000 "application " | TOPICAL_OINTMENT | RECTAL | Status: DC | PRN

## 2014-04-27 MED ORDER — DIPHENHYDRAMINE HCL 50 MG/ML IJ SOLN: 12.5000 mg | INTRAMUSCULAR | Status: DC | PRN

## 2014-04-27 MED ORDER — LACTATED RINGERS IV SOLN: 500.0000 mL | INTRAVENOUS | Status: DC | PRN

## 2014-04-27 MED ORDER — FENTANYL CITRATE 0.05 MG/ML IJ SOLN: 100.0000 ug | INTRAMUSCULAR | Status: DC | PRN

## 2014-04-27 MED ORDER — LANOLIN HYDROUS EX OINT: TOPICAL_OINTMENT | CUTANEOUS | Status: DC | PRN

## 2014-04-27 MED ORDER — OXYCODONE-ACETAMINOPHEN 5-325 MG PO TABS: 1.0000 | ORAL_TABLET | ORAL | Status: DC | PRN

## 2014-04-27 MED ORDER — FENTANYL 2.5 MCG/ML BUPIVACAINE 1/10 % EPIDURAL INFUSION (WH - ANES)
14.0000 mL/h | INTRAMUSCULAR | Status: DC | PRN
  Administered 2014-04-27: 14 mL/h via EPIDURAL

## 2014-04-27 MED ORDER — OXYTOCIN 40 UNITS IN LACTATED RINGERS INFUSION - SIMPLE MED: 62.5000 mL/h | INTRAVENOUS | Status: DC | PRN

## 2014-04-27 MED ORDER — OXYTOCIN BOLUS FROM INFUSION
500.0000 mL | INTRAVENOUS | Status: DC
  Administered 2014-04-27: 500 mL via INTRAVENOUS

## 2014-04-27 MED ORDER — IBUPROFEN 600 MG PO TABS
600.0000 mg | ORAL_TABLET | Freq: Four times a day (QID) | ORAL | Status: DC
  Administered 2014-04-27 – 2014-04-29 (×8): 600 mg via ORAL
  Filled 2014-04-27 (×8): qty 1

## 2014-04-27 MED ORDER — FLEET ENEMA 7-19 GM/118ML RE ENEM: 1.0000 | ENEMA | Freq: Every day | RECTAL | Status: DC | PRN

## 2014-04-27 MED ORDER — SODIUM CHLORIDE 0.9 % IJ SOLN: 3.0000 mL | INTRAMUSCULAR | Status: DC | PRN

## 2014-04-27 MED ORDER — BENZOCAINE-MENTHOL 20-0.5 % EX AERO
1.0000 "application " | INHALATION_SPRAY | CUTANEOUS | Status: DC | PRN
  Administered 2014-04-27: 1 via TOPICAL
  Filled 2014-04-27: qty 56

## 2014-04-27 MED ORDER — SENNOSIDES-DOCUSATE SODIUM 8.6-50 MG PO TABS
2.0000 | ORAL_TABLET | ORAL | Status: DC
  Administered 2014-04-29: 2 via ORAL
  Filled 2014-04-27 (×2): qty 2

## 2014-04-27 MED ORDER — PHENYLEPHRINE 40 MCG/ML (10ML) SYRINGE FOR IV PUSH (FOR BLOOD PRESSURE SUPPORT)
80.0000 ug | PREFILLED_SYRINGE | INTRAVENOUS | Status: DC | PRN
  Administered 2014-04-27: 80 ug via INTRAVENOUS
  Filled 2014-04-27: qty 2

## 2014-04-27 MED ORDER — BISACODYL 10 MG RE SUPP: 10.0000 mg | Freq: Every day | RECTAL | Status: DC | PRN

## 2014-04-27 MED ORDER — LACTATED RINGERS IV SOLN
INTRAVENOUS | Status: DC
  Administered 2014-04-27: 04:00:00 via INTRAVENOUS

## 2014-04-27 MED ORDER — MISOPROSTOL 200 MCG PO TABS
ORAL_TABLET | ORAL | Status: AC
  Administered 2014-04-27: 800 ug
  Filled 2014-04-27: qty 5

## 2014-04-27 MED ORDER — ACETAMINOPHEN 325 MG PO TABS: 650.0000 mg | ORAL_TABLET | ORAL | Status: DC | PRN

## 2014-04-27 MED ORDER — LIDOCAINE HCL (PF) 1 % IJ SOLN
30.0000 mL | INTRAMUSCULAR | Status: DC | PRN
  Filled 2014-04-27: qty 30

## 2014-04-27 MED ORDER — METHYLERGONOVINE MALEATE 0.2 MG/ML IJ SOLN
INTRAMUSCULAR | Status: AC
  Administered 2014-04-27: 0.2 mg
  Filled 2014-04-27: qty 1

## 2014-04-27 MED ORDER — PRENATAL MULTIVITAMIN CH
1.0000 | ORAL_TABLET | Freq: Every day | ORAL | Status: DC
  Administered 2014-04-27 – 2014-04-28 (×2): 1 via ORAL
  Filled 2014-04-27 (×2): qty 1

## 2014-04-27 MED ORDER — SIMETHICONE 80 MG PO CHEW
80.0000 mg | CHEWABLE_TABLET | ORAL | Status: DC | PRN
Start: 2014-04-27 — End: 2014-04-29

## 2014-04-27 MED ORDER — ONDANSETRON HCL 4 MG PO TABS: 4.0000 mg | ORAL_TABLET | ORAL | Status: DC | PRN

## 2014-04-27 MED ORDER — SODIUM CHLORIDE 0.9 % IJ SOLN: 3.0000 mL | Freq: Two times a day (BID) | INTRAMUSCULAR | Status: DC

## 2014-04-27 MED ORDER — CITRIC ACID-SODIUM CITRATE 334-500 MG/5ML PO SOLN: 30.0000 mL | ORAL | Status: DC | PRN

## 2014-04-27 MED ORDER — ONDANSETRON HCL 4 MG/2ML IJ SOLN: 4.0000 mg | INTRAMUSCULAR | Status: DC | PRN

## 2014-04-27 MED ORDER — EPHEDRINE 5 MG/ML INJ
10.0000 mg | INTRAVENOUS | Status: DC | PRN
  Filled 2014-04-27: qty 2

## 2014-04-27 MED ORDER — LIDOCAINE-EPINEPHRINE (PF) 2 %-1:200000 IJ SOLN
INTRAMUSCULAR | Status: DC | PRN
  Administered 2014-04-27: 3 mL

## 2014-04-27 MED ORDER — BUPIVACAINE HCL (PF) 0.25 % IJ SOLN
INTRAMUSCULAR | Status: DC | PRN
Start: 2014-04-27 — End: 2014-05-03
  Administered 2014-04-27 (×2): 4 mL via EPIDURAL

## 2014-04-27 MED ORDER — FENTANYL CITRATE 0.05 MG/ML IJ SOLN
INTRAMUSCULAR | Status: AC
  Administered 2014-04-27: 100 ug
  Filled 2014-04-27: qty 2

## 2014-04-27 MED ORDER — DIPHENHYDRAMINE HCL 25 MG PO CAPS: 25.0000 mg | ORAL_CAPSULE | Freq: Four times a day (QID) | ORAL | Status: DC | PRN

## 2014-04-27 MED ORDER — FENTANYL 2.5 MCG/ML BUPIVACAINE 1/10 % EPIDURAL INFUSION (WH - ANES)
INTRAMUSCULAR | Status: AC
  Administered 2014-04-27: 14 mL/h via EPIDURAL
  Filled 2014-04-27: qty 125

## 2014-04-27 MED ORDER — ZOLPIDEM TARTRATE 5 MG PO TABS: 5.0000 mg | ORAL_TABLET | Freq: Every evening | ORAL | Status: DC | PRN

## 2014-04-27 MED ORDER — LACTATED RINGERS IV SOLN: 500.0000 mL | Freq: Once | INTRAVENOUS | Status: DC

## 2014-04-27 MED ORDER — OXYCODONE-ACETAMINOPHEN 5-325 MG PO TABS: 2.0000 | ORAL_TABLET | ORAL | Status: DC | PRN

## 2014-04-27 MED ORDER — ONDANSETRON HCL 4 MG/2ML IJ SOLN: 4.0000 mg | Freq: Four times a day (QID) | INTRAMUSCULAR | Status: DC | PRN

## 2014-04-27 MED ORDER — OXYTOCIN 40 UNITS IN LACTATED RINGERS INFUSION - SIMPLE MED
62.5000 mL/h | INTRAVENOUS | Status: DC
  Filled 2014-04-27: qty 1000

## 2014-04-27 MED ORDER — ACETAMINOPHEN 500 MG PO TABS
1000.0000 mg | ORAL_TABLET | Freq: Four times a day (QID) | ORAL | Status: DC | PRN
  Administered 2014-04-27 (×2): 1000 mg via ORAL
  Filled 2014-04-27 (×2): qty 2

## 2014-04-27 MED ORDER — WITCH HAZEL-GLYCERIN EX PADS: 1.0000 "application " | MEDICATED_PAD | CUTANEOUS | Status: DC | PRN

## 2014-04-27 NOTE — MAU Note (Signed)
Pt states that contractions started yesterday at 1900. Denies bright red bleeding. Positive fetal movement.

## 2014-04-27 NOTE — Anesthesia Postprocedure Evaluation (Signed)
  Anesthesia Post-op Note  Patient: Traci [name]  Procedure(s) Performed: * No procedures listed *  Patient Location: Mother/Baby  Anesthesia Type:Epidural  Level of Consciousness: awake, alert , oriented and patient cooperative  Airway and Oxygen Therapy: Patient Spontanous Breathing  Post-op Pain: mild  Post-op Assessment: Patient's Cardiovascular Status Stable, Respiratory Function Stable, No headache, No backache, No residual numbness and No residual motor weakness  Post-op Vital Signs: stable  Last Vitals:  Filed Vitals:   04/27/14 1224  BP: 127/74  Pulse: 84  Temp: 37 C  Resp: 16    Complications: No apparent anesthesia complications

## 2014-04-27 NOTE — Progress Notes (Signed)
Patient ID: Traci [name] Demas, female   DOB: 1991/04/03, 24 y.o.   MRN: [id number] Labor Progress Note  ASSESSMENT:   Traci [name] Dragos 24 y.o. [id number] at 6037w0d in term active labor   PLAN:  1) Labor curve reviewed.       Progress: Active            Plan: Expectant  2) Fetal heart tracing reviewed.    Cat II, recurrent late decels  - attempted repositioning with improvement from prolonged decels to 90s, to decels to 120s from baseline of 150  - BP support after epidural  - Maternal O2  - FSE placed  3) GBS Status - neg  4) Other Problems Active Problems:   Active labor at term   SUBJECTIVE:  Comfortable s/p epidural.    OBJECTIVE:  Vital Signs: Patient Vitals for the past 2 hrs:  BP Temp Temp src Pulse Resp  04/27/14 0700 (!) 96/51 mmHg - - 91 -  04/27/14 0641 (!) 94/36 mmHg 98.1 F (36.7 C) Oral 71 16  04/27/14 0638 (!) 95/41 mmHg - - 85 -  04/27/14 0637 (!) 102/40 mmHg - - 94 -  04/27/14 0630 119/63 mmHg - - 94 -  04/27/14 0626 126/61 mmHg - - 85 -  04/27/14 0621 (!) 83/27 mmHg - - 89 -  04/27/14 0615 (!) 97/47 mmHg - - 62 -  04/27/14 0611 (!) 85/45 mmHg - - 89 -  04/27/14 0608 (!) 111/58 mmHg - - 92 -  04/27/14 0606 (!) 111/58 mmHg - - 92 -  04/27/14 0550 (!) 90/37 mmHg - - 98 -  04/27/14 0544 (!) 100/49 mmHg - - 100 20  04/27/14 0535 128/66 mmHg - - (!) 103 -  04/27/14 0534 - - - (!) 109 -   SVE: Dilation: 7.5, Effacement (%): 70, Station: 0  FHR Monitoring Baseline Rate (A): 150 bpm / + accels / mod variablity / recurrent late decels   Accelerations: None Contraction Frequency (min): 2-4

## 2014-04-27 NOTE — Progress Notes (Signed)
Dr Loreta Aveacosta at bedside

## 2014-04-27 NOTE — Anesthesia Preprocedure Evaluation (Signed)
Anesthesia Evaluation  Patient identified by MRN, date of birth, ID band Patient awake    Reviewed: Allergy & Precautions, NPO status , Patient's Chart, lab work & pertinent test results  History of Anesthesia Complications Negative for: history of anesthetic complications  Airway Mallampati: II  TM Distance: >3 FB Neck ROM: Full    Dental  (+) Teeth Intact   Pulmonary neg pulmonary ROS,  breath sounds clear to auscultation        Cardiovascular negative cardio ROS  Rhythm:Regular     Neuro/Psych negative neurological ROS  negative psych ROS   GI/Hepatic negative GI ROS, Neg liver ROS,   Endo/Other  diabetes, Gestational, Oral Hypoglycemic Agents  Renal/GU negative Renal ROS     Musculoskeletal   Abdominal   Peds  Hematology negative hematology ROS (+)   Anesthesia Other Findings   Reproductive/Obstetrics                             Anesthesia Physical Anesthesia Plan  ASA: II  Anesthesia Plan: Epidural   Post-op Pain Management:    Induction:   Airway Management Planned:   Additional Equipment:   Intra-op Plan:   Post-operative Plan:   Informed Consent: I have reviewed the patients History and Physical, chart, labs and discussed the procedure including the risks, benefits and alternatives for the proposed anesthesia with the patient or authorized representative who has indicated his/her understanding and acceptance.     Plan Discussed with: Anesthesiologist  Anesthesia Plan Comments:         Anesthesia Quick Evaluation

## 2014-04-27 NOTE — H&P (Signed)
LABOR ADMISSION HISTORY AND PHYSICAL  Traci [name] is a 24 y.o. female G3P2002 with IUP at [redacted]w[redacted]d by 12 wk US presenting for contractions. She reports +FMs, No LOF, no VB, no blurry vision, headaches or peripheral edema, and RUQ pain.  She plans on breast feeding. She is undecided for birth control.  Dating: By 12 wk US --->  Estimated Date of Delivery: 05/04/14  Sono:    Normal anatomy  Prenatal History/Complications: A2GDM - on glyburide once a day H/o pre-eclampsia Inc risk of Downs with low risk panorama  Past Medical History: Past Medical History  Diagnosis Date  . Hernia   . Gestational diabetes   . Pregnancy induced hypertension     Past Surgical History: Past Surgical History  Procedure Laterality Date  . Tooth extaction      Obstetrical History: OB History    Gravida Para Term Preterm AB TAB SAB Ectopic Multiple Living   3 2 2       2      Social History: History   Social History  . Marital Status: Single    Spouse Name: N/A    Number of Children: N/A  . Years of Education: N/A   Social History Main Topics  . Smoking status: Never Smoker   . Smokeless tobacco: Never Used  . Alcohol Use: No  . Drug Use: No  . Sexual Activity: Yes    Birth Control/ Protection: None     Comment: Last Depo shot was approx summer 2012   Other Topics Concern  . Not on file   Social History Narrative    Family History: Family History  Problem Relation Age of Onset  . Diabetes Maternal Grandmother     Allergies: No Known Allergies  Prescriptions prior to admission  Medication Sig Dispense Refill Last Dose  . acetaminophen (TYLENOL) 325 MG tablet Take 650 mg by mouth every 6 (six) hours as needed for headache.   04/27/2014  . Blood Glucose Monitoring Suppl (ACCU-CHEK NANO SMARTVIEW) W/DEVICE KIT 1 each by Does not apply route 4 (four) times daily. 1 kit 11 04/27/2014  . glucose blood (ACCU-CHEK SMARTVIEW) test strip Use as instructed checking blood sugar levels  4 times daily. 100 each 12 04/27/2014  . glyBURIDE (DIABETA) 2.5 MG tablet Take 1 tablet (2.5 mg total) by mouth at bedtime. 30 tablet 3 04/27/2014  . Lancets Misc. (ACCU-CHEK FASTCLIX LANCET) KIT 1 each by Does not apply route 4 (four) times daily. 1 kit 11 04/27/2014  . pantoprazole (PROTONIX) 20 MG tablet Take 1 tablet (20 mg total) by mouth daily. 30 tablet 11 04/27/2014  . Prenatal Vit-Fe Fumarate-FA (PRENATAL MULTIVITAMIN) TABS tablet Take 1 tablet by mouth daily. 30 tablet 10 04/27/2014     Review of Systems   All systems reviewed and negative except as stated in HPI  Height 5' 4" (1.626 m), weight 188 lb (85.276 kg), last menstrual period 08/06/2013, unknown if currently breastfeeding. General appearance: alert, cooperative and appears stated age Lungs: clear to auscultation bilaterally Heart: regular rate and rhythm Abdomen: soft, non-tender; bowel sounds normal Pelvic: adequate Extremities: Homans sign is negative, no sign of DVT Presentation: cephalic Fetal monitoringBaseline: 145 bpm, Variability: Good {> 6 bpm), Accelerations: Reactive and Decelerations: Variable: mild Uterine activityDate/time of onset: 1/18 in AM, Frequency: Every 3-4 minutes, Duration: 30 seconds and Intensity: moderate Dilation: 10 Effacement (%): 100 Station: -1 Exam by:: Dr.Roberts   Prenatal labs: ABO, Rh: B/POS/-- (07/15 1211) Antibody: NEG (07/15 1211) Rubella:     RPR: NON REAC (10/26 1544)  HBsAg: NEGATIVE (07/15 1211)  HIV: NONREACTIVE (10/26 1544)  GBS: Negative (12/30 0000)  1 hr Glucola 159, 3 hour vomited Genetic screening  Inc risk of down's syndrome, with increased ROD 1:85. Low Risk panorama Anatomy US Normal   Results for orders placed or performed during the hospital encounter of 04/26/14 (from the past 24 hour(s))  Fern Test   Collection Time: 04/26/14  6:33 AM  Result Value Ref Range   POCT Fern Test Negative = intact amniotic membranes     Patient Active Problem List    Diagnosis Date Noted  . Active labor at term 04/27/2014  . Variable fetal heart rate decelerations, antepartum   . Decreased fetal movement determined by examination   . Abnormal quad screen with increased ROD 1:85 11/24/2013  . Diabetes mellitus in pregnancy in third trimester 10/21/2013  . History of pre-eclampsia in prior pregnancy, currently pregnant 10/21/2013  . Supervision of high risk pregnancy, antepartum 10/21/2013    Assessment: Traci [name] is a 24 y.o. G3P2002 at [redacted]w[redacted]d here for term labor  #Labor: Expectant management. Will perform AROM given pt discomfort and eminent delivery # A2GDM - Well controlled on glyburide 2.5 mg q PM. Will perform accucheck now and q 2 hours in active labor #Pain: Fentanyl if able #FWB: Cat II, reassuring #ID:  GBS neg #MOF: Breast #MOC: Undecided #Circ:  Desires, inpatient  Roberts, Caroline C 04/27/2014, 3:47 AM     

## 2014-04-27 NOTE — Anesthesia Procedure Notes (Signed)
Epidural Patient location during procedure: OB  Staffing Anesthesiologist: Duriel Deery, CHRIS Performed by: anesthesiologist   Preanesthetic Checklist Completed: patient identified, surgical consent, pre-op evaluation, timeout performed, IV checked, risks and benefits discussed and monitors and equipment checked  Epidural Patient position: sitting Prep: site prepped and draped and DuraPrep Patient monitoring: heart rate, cardiac monitor, continuous pulse ox and blood pressure Approach: midline Location: L3-L4 Injection technique: LOR saline  Needle:  Needle type: Tuohy  Needle gauge: 17 G Needle length: 9 cm Needle insertion depth: 7 cm Catheter type: closed end flexible Catheter size: 19 Gauge Catheter at skin depth: 12 cm Test dose: negative and 2% lidocaine with Epi 1:200 K  Assessment Events: blood not aspirated, injection not painful, no injection resistance, negative IV test and no paresthesia  Additional Notes H+P and labs checked, risks and benefits discussed with the patient, consent obtained, procedure tolerated well and without complications.  Reason for block:procedure for pain   

## 2014-04-27 NOTE — Lactation Note (Signed)
This note was copied from the chart of Traci [name]. Lactation Consultation Note  Patient Name: Traci Dante GangShatona Raffel WUJWJ'XToday's Date: 04/27/2014 Reason for consult: Initial assessment of this mom and baby at 12 hours pp.  This is mom's third baby.  She breastfed her 2 and 544 yo sons for 1 year each but with her newborn, she reports a pinching when he nurses.  He was too sleepy to latch at recent attempt.  Mom says she knows how to hand express her milk and LC encouraged her to apply ebm to nipples before latching and after feedings.  LC also suggested offering a clean finger for suck training prior to latch and mom to call for nurse or LC assistance at next feeding.  LC reviewed benefits of frequent STS and cue feedings.  Baby has had 2 feedings of 20 minutes and 1 feeding of 10 minutes and has passed first stool.  Baby is STS and not cuing at this time.  Mom encouraged to feed baby 8-12 times/24 hours and with feeding cues. LC encouraged review of Baby and Me pp 9, 14 and 20-25 for STS and BF information. LC provided [employer] Resource brochure and reviewed [facility] and list of community and web site resources.    Maternal Data Formula Feeding for Exclusion: No Has patient been taught Hand Expression?: Yes (experienced BF mom and [facility] that she knows how to hand express) Does the patient have breastfeeding experience prior to this delivery?: Yes  Feeding Feeding Type: Breast Fed Length of feed: 10 min  LATCH Score/Interventions         LATCH score=7 per RN assessment             Lactation Tools Discussed/Used   STS, cue feedings, hand expression and nipple care, suck training prior to latch and/or hand pump briefly before latching   Consult Status Consult Status: Follow-up Date: 04/28/14 Follow-up type: In-patient    Warrick ParisianBryant, [name] 04/27/2014, 9:18 PM

## 2014-04-27 NOTE — Plan of Care (Signed)
Problem: Phase I Progression Outcomes Goal: OOB as tolerated unless otherwise ordered Outcome: Progressing Numb rt leg when transferred from L+D stedy used

## 2014-04-28 LAB — HIV ANTIBODY (ROUTINE TESTING W REFLEX)
HIV 1/O/2 Abs-Index Value: <1 (ref <1.00)
HIV-1/HIV-2 Ab: NONREACTIVE

## 2014-04-28 LAB — RPR: RPR: NONREACTIVE

## 2014-04-28 LAB — GLUCOSE, CAPILLARY: Glucose-Capillary: 81 mg/dL (ref 70–99)

## 2014-04-28 NOTE — Progress Notes (Signed)
Post Partum Day 1 Subjective: up ad lib, voiding, tolerating PO, + flatus and c/o sore nipples with breastfeeding  Objective: Blood pressure 110/58, pulse 72, temperature 98.2 F (36.8 C), temperature source Oral, resp. rate 18, height 5\' 4"  (1.626 m), weight 85.276 kg (188 lb), last menstrual period 08/06/2013, SpO2 98 %, unknown if currently breastfeeding.  Physical Exam:  General: alert, cooperative and no distress Lochia: appropriate Heart and Lungs: RRR and CTA Uterine Fundus: firm Incision: none DVT Evaluation: No evidence of DVT seen on physical exam.   Recent Labs  04/27/14 0345  HGB 12.8  HCT 37.6    Assessment/Plan: Plan for discharge tomorrow, Breastfeeding and Contraception Depo   LOS: 1 day   Rolm BookbinderMoss, Amber 04/28/2014, 7:34 AM

## 2014-04-28 NOTE — Progress Notes (Signed)
Ur chart review completed.  

## 2014-04-28 NOTE — Lactation Note (Addendum)
This note was copied from the chart of Traci [name] Traci [name]. Lactation Consultation Note Follow up visit at 34 hours of age.  Baby is now awake and showing feeding cues.  Assisted with cross cradle position, mom has everted compressible breasts.  Baby opens mouth wide with deep latch, rhythmic sucking and latch appears good.  Mom is complaining of pain "10" on a scale of 1-10.  No trauma to nipple noted, slight compression and slightly pink tip, but baby didn't maintain latch long.  Tight frenulum noted when baby cries.  Oral assessment with gloved finger done, baby is biting with gums on top and bottom and tongue thrusting.  Mom has very easily expressible colostrum and my feeling is baby has been getting adequate milk at the breast by gumming also indicated by adequate stools and voids.  Due to moms severe nipple pain and possible tight frenulum, discussed pumping with mom.  Mom was able to breast feed 2 older children for a year each and wants to provide breast milk for this baby.  Hand expressed 3mls easily and cup fed to baby and tolerated well.  Instructed but didn't demonstrate syringe feeding to encouraged suck training, encouraged mom to request assist from RN if she wants to syringe/finger feed baby.  DEBP set up on preemie setting for 15 minutes, encouraged 8 times a day with hand expression to follow.  Mom quickly pumps several mls and discussed storage and cleaning.  Encouraged mom to offer 10mls, but mom is pumping more with this session.  Discussed storage and cleaning.  Mom denies questions at this time and reports she feels good about this plan.  Mom to talk with peds in the morning more about tongue mobility and function.  Mom to call for assist as needed.      Patient Name: Traci [name] ZOXWR'UToday's Date: 04/28/2014 Reason for consult: Follow-up assessment;Breast/nipple pain;Difficult latch   Maternal Data    Feeding Feeding Type: Breast Milk Length of feed:  (minutes)  LATCH  Score/Interventions Latch: Grasps breast easily, tongue down, lips flanged, rhythmical sucking. Intervention(s): Breast massage;Breast compression  Audible Swallowing: Spontaneous and intermittent  Type of Nipple: Everted at rest and after stimulation  Comfort (Breast/Nipple): Engorged, cracked, bleeding, large blisters, severe discomfort     Hold (Positioning): Assistance needed to correctly position infant at breast and maintain latch. Intervention(s): Breastfeeding basics reviewed;Support Pillows;Position options;Skin to skin  LATCH Score: 7  Lactation Tools Discussed/Used WIC Program: Yes Pump Review: Setup, frequency, and cleaning;Milk Storage Initiated by:: JS Date initiated:: 04/28/14   Consult Status Consult Status: Follow-up Date: 04/29/14 Follow-up type: In-patient    [name], Arvella MerlesJana [name] 04/28/2014, 7:02 PM

## 2014-04-28 NOTE — Lactation Note (Signed)
This note was copied from the chart of [name]. Lactation Consultation Note Attempted visit with latch assist baby too sleepy to feed.  Mom is complaining of nipple pain.  Nipples are WNL and comfort gels were given by RN.  Baby STS, mom to call with next feeding.    Patient Name: [name] ZOXWR'UToday's Date: 04/28/2014     Maternal Data    Feeding Feeding Type: Breast Fed Length of feed: 15 min  LATCH Score/Interventions                      Lactation Tools Discussed/Used     Consult Status      Ameirah Khatoon, [name] 04/28/2014, 6:04 PM

## 2014-04-29 LAB — GLUCOSE, CAPILLARY: GLUCOSE-CAPILLARY: 76 mg/dL (ref 70–99)

## 2014-04-29 MED ORDER — OXYCODONE-ACETAMINOPHEN 5-325 MG PO TABS
1.0000 | ORAL_TABLET | ORAL | Status: DC | PRN
Start: 2014-04-29 — End: 2014-04-29
  Administered 2014-04-29 (×2): 1 via ORAL
  Filled 2014-04-29 (×2): qty 1

## 2014-04-29 MED ORDER — OXYCODONE-ACETAMINOPHEN 5-325 MG PO TABS: 1.0000 | ORAL_TABLET | ORAL | Status: DC | PRN

## 2014-04-29 NOTE — Discharge Summary (Signed)
Obstetric Discharge Summary Reason for Admission: onset of labor Prenatal Procedures: NST, ultrasound Intrapartum Procedures: spontaneous vaginal delivery Postpartum Procedures: none Complications-Operative and Postpartum: none HEMOGLOBIN  Date Value Ref Range Status  04/27/2014 12.8 12.0 - 15.0 g/dL Final   HCT  Date Value Ref Range Status  04/27/2014 37.6 36.0 - 46.0 % Final    Physical Exam:  General: alert, cooperative, appears stated age, no distress and mildly obese Lochia: appropriate Uterine Fundus: firm Incision: n/a DVT Evaluation: No evidence of DVT seen on physical exam. No cords or calf tenderness. No significant calf/ankle edema.  Discharge Diagnoses: Term Pregnancy-delivered  Discharge Information: Date: 04/29/2014 Activity: pelvic rest Diet: routine Medications: PNV and Percocet #10 Condition: stable Instructions: refer to practice specific booklet Discharge to: home   [facility]  Follow-up Information    [facility].   Specialty:  Obstetrics and Gynecology   Contact information:   [address] [place] [id number] [phone]      Newborn Data: Live born female  Birth Weight: 7 lb 11.5 oz (3500 g) APGAR: 8, 9  Home with mother.  Kathee DeltonMcKeag, Ian D 04/29/2014, 7:48 AM   I have seen and examined this patient and I agree with the above. Cam HaiSHAW, Eraina Winnie CNM 9:21 AM 04/29/2014

## 2014-04-29 NOTE — Discharge Instructions (Signed)
Breastfeeding Deciding to breastfeed is one of the best choices you can make for you and your baby. A change in hormones during pregnancy causes your breast tissue to grow and increases the number and size of your milk ducts. These hormones also allow proteins, sugars, and fats from your blood supply to make breast milk in your milk-producing glands. Hormones prevent breast milk from being released before your baby is born as well as prompt milk flow after birth. Once breastfeeding has begun, thoughts of your baby, as well as his or her sucking or crying, can stimulate the release of milk from your milk-producing glands.  BENEFITS OF BREASTFEEDING For Your Baby  Your first milk (colostrum) helps your baby's digestive system function better.   There are antibodies in your milk that help your baby fight off infections.   Your baby has a lower incidence of asthma, allergies, and sudden infant death syndrome.   The nutrients in breast milk are better for your baby than infant formulas and are designed uniquely for your baby's needs.   Breast milk improves your baby's brain development.   Your baby is less likely to develop other conditions, such as childhood obesity, asthma, or type 2 diabetes mellitus.  For You   Breastfeeding helps to create a very special bond between you and your baby.   Breastfeeding is convenient. Breast milk is always available at the correct temperature and costs nothing.   Breastfeeding helps to burn calories and helps you lose the weight gained during pregnancy.   Breastfeeding makes your uterus contract to its prepregnancy size faster and slows bleeding (lochia) after you give birth.   Breastfeeding helps to lower your risk of developing type 2 diabetes mellitus, osteoporosis, and breast or ovarian cancer later in life. SIGNS THAT YOUR BABY IS HUNGRY Early Signs of Hunger  Increased alertness or activity.  Stretching.  Movement of the head from  side to side.  Movement of the head and opening of the mouth when the corner of the mouth or cheek is stroked (rooting).  Increased sucking sounds, smacking lips, cooing, sighing, or squeaking.  Hand-to-mouth movements.  Increased sucking of fingers or hands. Late Signs of Hunger  Fussing.  Intermittent crying. Extreme Signs of Hunger Signs of extreme hunger will require calming and consoling before your baby will be able to breastfeed successfully. Do not wait for the following signs of extreme hunger to occur before you initiate breastfeeding:   Restlessness.  A loud, strong cry.   Screaming. BREASTFEEDING BASICS Breastfeeding Initiation  Find a comfortable place to sit or lie down, with your neck and back well supported.  Place a pillow or rolled up blanket under your baby to bring him or her to the level of your breast (if you are seated). Nursing pillows are specially designed to help support your arms and your baby while you breastfeed.  Make sure that your baby's abdomen is facing your abdomen.   Gently massage your breast. With your fingertips, massage from your chest wall toward your nipple in a circular motion. This encourages milk flow. You may need to continue this action during the feeding if your milk flows slowly.  Support your breast with 4 fingers underneath and your thumb above your nipple. Make sure your fingers are well away from your nipple and your baby's mouth.   Stroke your baby's lips gently with your finger or nipple.   When your baby's mouth is open wide enough, quickly bring your baby to your  breast, placing your entire nipple and as much of the colored area around your nipple (areola) as possible into your baby's mouth.   More areola should be visible above your baby's upper lip than below the lower lip.   Your baby's tongue should be between his or her lower gum and your breast.   Ensure that your baby's mouth is correctly positioned  around your nipple (latched). Your baby's lips should create a seal on your breast and be turned out (everted).  It is common for your baby to suck about 2-3 minutes in order to start the flow of breast milk. Latching Teaching your baby how to latch on to your breast properly is very important. An improper latch can cause nipple pain and decreased milk supply for you and poor weight gain in your baby. Also, if your baby is not latched onto your nipple properly, he or she may swallow some air during feeding. This can make your baby fussy. Burping your baby when you switch breasts during the feeding can help to get rid of the air. However, teaching your baby to latch on properly is still the best way to prevent fussiness from swallowing air while breastfeeding. Signs that your baby has successfully latched on to your nipple:    Silent tugging or silent sucking, without causing you pain.   Swallowing heard between every 3-4 sucks.    Muscle movement above and in front of his or her ears while sucking.  Signs that your baby has not successfully latched on to nipple:   Sucking sounds or smacking sounds from your baby while breastfeeding.  Nipple pain. If you think your baby has not latched on correctly, slip your finger into the corner of your baby's mouth to break the suction and place it between your baby's gums. Attempt breastfeeding initiation again. Signs of Successful Breastfeeding Signs from your baby:   A gradual decrease in the number of sucks or complete cessation of sucking.   Falling asleep.   Relaxation of his or her body.   Retention of a small amount of milk in his or her mouth.   Letting go of your breast by himself or herself. Signs from you:  Breasts that have increased in firmness, weight, and size 1-3 hours after feeding.   Breasts that are softer immediately after breastfeeding.  Increased milk volume, as well as a change in milk consistency and color by  the fifth day of breastfeeding.   Nipples that are not sore, cracked, or bleeding. Signs That Your Baby is Getting Enough Milk  Wetting at least 3 diapers in a 24-hour period. The urine should be clear and pale yellow by age 5 days.  At least 3 stools in a 24-hour period by age 5 days. The stool should be soft and yellow.  At least 3 stools in a 24-hour period by age 7 days. The stool should be seedy and yellow.  No loss of weight greater than 10% of birth weight during the first 3 days of age.  Average weight gain of 4-7 ounces (113-198 g) per week after age 4 days.  Consistent daily weight gain by age 5 days, without weight loss after the age of 2 weeks. After a feeding, your baby may spit up a small amount. This is common. BREASTFEEDING FREQUENCY AND DURATION Frequent feeding will help you make more milk and can prevent sore nipples and breast engorgement. Breastfeed when you feel the need to reduce the fullness of your breasts   or when your baby shows signs of hunger. This is called "breastfeeding on demand." Avoid introducing a pacifier to your baby while you are working to establish breastfeeding (the first 4-6 weeks after your baby is born). After this time you may choose to use a pacifier. Research has shown that pacifier use during the first year of a baby's life decreases the risk of sudden infant death syndrome (SIDS). Allow your baby to feed on each breast as long as he or she wants. Breastfeed until your baby is finished feeding. When your baby unlatches or falls asleep while feeding from the first breast, offer the second breast. Because newborns are often sleepy in the first few weeks of life, you may need to awaken your baby to get him or her to feed. Breastfeeding times will vary from baby to baby. However, the following rules can serve as a guide to help you ensure that your baby is properly fed:  Newborns (babies 59 weeks of age or younger) may breastfeed every 1-3  hours.  Newborns should not go longer than 3 hours during the day or 5 hours during the night without breastfeeding.  You should breastfeed your baby a minimum of 8 times in a 24-hour period until you begin to introduce solid foods to your baby at around 11 months of age. BREAST MILK PUMPING Pumping and storing breast milk allows you to ensure that your baby is exclusively fed your breast milk, even at times when you are unable to breastfeed. This is especially important if you are going back to work while you are still breastfeeding or when you are not able to be present during feedings. Your lactation consultant can give you guidelines on how long it is safe to store breast milk.  A breast pump is a machine that allows you to pump milk from your breast into a sterile bottle. The pumped breast milk can then be stored in a refrigerator or freezer. Some breast pumps are operated by hand, while others use electricity. Ask your lactation consultant which type will work best for you. Breast pumps can be purchased, but some hospitals and breastfeeding support groups lease breast pumps on a monthly basis. A lactation consultant can teach you how to hand express breast milk, if you prefer not to use a pump.  CARING FOR YOUR BREASTS WHILE YOU BREASTFEED Nipples can become dry, cracked, and sore while breastfeeding. The following recommendations can help keep your breasts moisturized and healthy:  Avoid using soap on your nipples.   Wear a supportive bra. Although not required, special nursing bras and tank tops are designed to allow access to your breasts for breastfeeding without taking off your entire bra or top. Avoid wearing underwire-style bras or extremely tight bras.  Air dry your nipples for 3-65minutes after each feeding.   Use only cotton bra pads to absorb leaked breast milk. Leaking of breast milk between feedings is normal.   Use lanolin on your nipples after breastfeeding. Lanolin helps to  maintain your skin's normal moisture barrier. If you use pure lanolin, you do not need to wash it off before feeding your baby again. Pure lanolin is not toxic to your baby. You may also hand express a few drops of breast milk and gently massage that milk into your nipples and allow the milk to air dry. In the first few weeks after giving birth, some women experience extremely full breasts (engorgement). Engorgement can make your breasts feel heavy, warm, and tender to the  touch. Engorgement peaks within 3-5 days after you give birth. The following recommendations can help ease engorgement:  Completely empty your breasts while breastfeeding or pumping. You may want to start by applying warm, moist heat (in the shower or with warm water-soaked hand towels) just before feeding or pumping. This increases circulation and helps the milk flow. If your baby does not completely empty your breasts while breastfeeding, pump any extra milk after he or she is finished.  Wear a snug bra (nursing or regular) or tank top for 1-2 days to signal your body to slightly decrease milk production.  Apply ice packs to your breasts, unless this is too uncomfortable for you.  Make sure that your baby is latched on and positioned properly while breastfeeding. If engorgement persists after 48 hours of following these recommendations, contact your health care provider or a Advertising copywriter. OVERALL HEALTH CARE RECOMMENDATIONS WHILE BREASTFEEDING  Eat healthy foods. Alternate between meals and snacks, eating 3 of each per day. Because what you eat affects your breast milk, some of the foods may make your baby more irritable than usual. Avoid eating these foods if you are sure that they are negatively affecting your baby.  Drink milk, fruit juice, and water to satisfy your thirst (about 10 glasses a day).   Rest often, relax, and continue to take your prenatal vitamins to prevent fatigue, stress, and anemia.  Continue  breast self-awareness checks.  Avoid chewing and smoking tobacco.  Avoid alcohol and drug use. Some medicines that may be harmful to your baby can pass through breast milk. It is important to ask your health care provider before taking any medicine, including all over-the-counter and prescription medicine as well as vitamin and herbal supplements. It is possible to become pregnant while breastfeeding. If birth control is desired, ask your health care provider about options that will be safe for your baby. SEEK MEDICAL CARE IF:   You feel like you want to stop breastfeeding or have become frustrated with breastfeeding.  You have painful breasts or nipples.  Your nipples are cracked or bleeding.  Your breasts are red, tender, or warm.  You have a swollen area on either breast.  You have a fever or chills.  You have nausea or vomiting.  You have drainage other than breast milk from your nipples.  Your breasts do not become full before feedings by the fifth day after you give birth.  You feel sad and depressed.  Your baby is too sleepy to eat well.  Your baby is having trouble sleeping.   Your baby is wetting less than 3 diapers in a 24-hour period.  Your baby has less than 3 stools in a 24-hour period.  Your baby's skin or the white part of his or her eyes becomes yellow.   Your baby is not gaining weight by 83 days of age. SEEK IMMEDIATE MEDICAL CARE IF:   Your baby is overly tired (lethargic) and does not want to wake up and feed.  Your baby develops an unexplained fever. Document Released: 03/26/2005 Document Revised: 03/31/2013 Document Reviewed: 09/17/2012 [facility] Patient Information 2015 [place], [place]. This information is not intended to replace advice given to you by your health care provider. Make sure you discuss any questions you have with your health care provider.   Postpartum Care After Vaginal Delivery After you deliver your newborn (postpartum  period), the usual stay in the hospital is 24-72 hours. If there were problems with your labor or delivery, or if  you have other medical problems, you might be in the hospital longer.  While you are in the hospital, you will receive help and instructions on how to care for yourself and your newborn during the postpartum period.  While you are in the hospital:  Be sure to tell your nurses if you have pain or discomfort, as well as where you feel the pain and what makes the pain worse.  If you had an incision made near your vagina (episiotomy) or if you had some tearing during delivery, the nurses may put ice packs on your episiotomy or tear. The ice packs may help to reduce the pain and swelling.  If you are breastfeeding, you may feel uncomfortable contractions of your uterus for a couple of weeks. This is normal. The contractions help your uterus get back to normal size.  It is normal to have some bleeding after delivery.  For the first 1-3 days after delivery, the flow is red and the amount may be similar to a period.  It is common for the flow to start and stop.  In the first few days, you may pass some small clots. Let your nurses know if you begin to pass large clots or your flow increases.  Do not  flush blood clots down the toilet before having the nurse look at them.  During the next 3-10 days after delivery, your flow should become more watery and pink or brown-tinged in color.  Ten to fourteen days after delivery, your flow should be a small amount of yellowish-white discharge.  The amount of your flow will decrease over the first few weeks after delivery. Your flow may stop in 6-8 weeks. Most women have had their flow stop by 12 weeks after delivery.  You should change your sanitary pads frequently.  Wash your hands thoroughly with soap and water for at least 20 seconds after changing pads, using the toilet, or before holding or feeding your newborn.  You should feel like you  need to empty your bladder within the first 6-8 hours after delivery.  In case you become weak, lightheaded, or faint, call your nurse before you get out of bed for the first time and before you take a shower for the first time.  Within the first few days after delivery, your breasts may begin to feel tender and full. This is called engorgement. Breast tenderness usually goes away within 48-72 hours after engorgement occurs. You may also notice milk leaking from your breasts. If you are not breastfeeding, do not stimulate your breasts. Breast stimulation can make your breasts produce more milk.  Spending as much time as possible with your newborn is very important. During this time, you and your newborn can feel close and get to know each other. Having your newborn stay in your room (rooming in) will help to strengthen the bond with your newborn. It will give you time to get to know your newborn and become comfortable caring for your newborn.  Your hormones change after delivery. Sometimes the hormone changes can temporarily cause you to feel sad or tearful. These feelings should not last more than a few days. If these feelings last longer than that, you should talk to your caregiver.  If desired, talk to your caregiver about methods of family planning or contraception.  Talk to your caregiver about immunizations. Your caregiver may want you to have the following immunizations before leaving the hospital:  Tetanus, diphtheria, and pertussis (Tdap) or tetanus and diphtheria (Td)  immunization. It is very important that you and your family (including grandparents) or others caring for your newborn are up-to-date with the Tdap or Td immunizations. The Tdap or Td immunization can help protect your newborn from getting ill.  Rubella immunization.  Varicella (chickenpox) immunization.  Influenza immunization. You should receive this annual immunization if you did not receive the immunization during  your pregnancy. Document Released: 01/21/2007 Document Revised: 12/19/2011 Document Reviewed: 11/21/2011 [facility] Patient Information 2015 [place], [place]. This information is not intended to replace advice given to you by your health care provider. Make sure you discuss any questions you have with your health care provider.

## 2014-04-29 NOTE — Lactation Note (Signed)
This note was copied from the chart of Traci [name] Walen. Lactation Consultation Note  Patient Name: Traci [name] Surman [id number]'s Date: 04/29/2014 Reason for consult: Follow-up assessment Mom reports baby is nursing well, denies questions/concerns. Will refer to Baby N Me booklet for engorgement care if needed. Advised of OP services.   Maternal Data    Feeding Feeding Type: Breast Fed Length of feed: 40 min  LATCH Score/Interventions                      Lactation Tools Discussed/Used     Consult Status Consult Status: Complete Date: 04/29/14 Follow-up type: In-patient    Alfred LevinsGranger, Brenda Samano Ann 04/29/2014, 11:53 AM

## 2014-05-06 ENCOUNTER — Ambulatory Visit (INDEPENDENT_AMBULATORY_CARE_PROVIDER_SITE_OTHER): Payer: Medicaid Other | Admitting: *Deleted

## 2014-05-06 VITALS — BP 121/80 | HR 97

## 2014-05-06 DIAGNOSIS — Z01812 Encounter for preprocedural laboratory examination: Secondary | ICD-10-CM

## 2014-05-06 DIAGNOSIS — Z3042 Encounter for surveillance of injectable contraceptive: Secondary | ICD-10-CM

## 2014-05-06 MED ORDER — MEDROXYPROGESTERONE ACETATE 104 MG/0.65ML ~~LOC~~ SUSP
104.0000 mg | Freq: Once | SUBCUTANEOUS | Status: AC
  Administered 2014-05-06: 104 mg via SUBCUTANEOUS

## 2014-05-06 NOTE — Progress Notes (Signed)
Had a vaginal delivery 04/27/14 . Wants to start depo provera. Denies intercourse , upt today negative. Per Margarita MailW. Karim, CNM may start depoprovera.

## 2014-05-07 LAB — POCT PREGNANCY, URINE: PREG TEST UR: NEGATIVE

## 2014-06-07 ENCOUNTER — Ambulatory Visit (INDEPENDENT_AMBULATORY_CARE_PROVIDER_SITE_OTHER): Payer: Medicaid Other | Admitting: Obstetrics & Gynecology

## 2014-06-07 ENCOUNTER — Encounter: Payer: Self-pay | Admitting: Obstetrics & Gynecology

## 2014-06-07 VITALS — BP 121/63 | HR 81 | Ht 64.0 in | Wt 160.8 lb

## 2014-06-07 DIAGNOSIS — Z8632 Personal history of gestational diabetes: Secondary | ICD-10-CM

## 2014-06-07 DIAGNOSIS — K439 Ventral hernia without obstruction or gangrene: Secondary | ICD-10-CM

## 2014-06-07 NOTE — Progress Notes (Signed)
Patient would like discuss a referral to have her hernia repaired.

## 2014-06-07 NOTE — Progress Notes (Signed)
     Subjective:     Traci [name] is a 24 y.o. [id number] female who presents for a postpartum visit. She is 6 weeks postpartum following a spontaneous vaginal delivery. I have fully reviewed the prenatal and intrapartum course; she had A2GDM. The delivery was at 39 gestational weeks.  Anesthesia: epidural. Postpartum course has been uncomplicated. Baby's course has been uncomplicated. Baby is feeding by breast. Bleeding no bleeding. Bowel function is normal. Bladder function is normal. Patient is sexually active. Contraception method is Depo-Provera injections. Postpartum depression screening: negative.  Patient desires referral for hernia evaluation.  The following portions of the patient's history were reviewed and updated as appropriate: allergies, current medications, past family history, past medical history, past social history, past surgical history and problem list.Normal pap on 11/19/14.  Review of Systems Pertinent items are noted in HPI.   Objective:    BP 121/63 mmHg  Pulse 81  Ht 5\' 4"  (1.626 m)  Wt 160 lb 12.8 oz (72.938 kg)  BMI 27.59 kg/m2  Breastfeeding? Yes  General:  alert and no distress   Breasts:  inspection negative, no nipple discharge or bleeding, no masses or nodularity palpable  Lungs: clear to auscultation bilaterally  Heart:  regular rate and rhythm  Abdomen: normal findings: soft, non-tender 9 x 9 cm supraumbilical reducible hernia palpated. No tenderness   Vulva:  normal  Vagina: normal vagina  Cervix:  multiparous appearance and no cervical motion tenderness  Corpus: normal size, contour, position, consistency, mobility, non-tender  Adnexa:  normal adnexa and no mass, fullness, tenderness  Rectal Exam: Normal rectovaginal exam        Assessment:   Normal postpartum exam. Pap smear not done at today's visit.   Plan:   1. Contraception: Depo-Provera injections received 05/06/14 2. Needs 2 hr GTT given history of A2GDM 3. Referral to General  Surgery for hernia evaluation and management 3. Follow up as scheduled for next Depo Provera and 2 hr GTT  [name], MD, FACOG Attending Obstetrician & Gynecologist Faculty Practice, [facility] [place]

## 2014-06-14 ENCOUNTER — Other Ambulatory Visit: Payer: Medicaid Other

## 2014-07-26 ENCOUNTER — Ambulatory Visit: Payer: Medicaid Other

## 2014-07-29 ENCOUNTER — Ambulatory Visit: Payer: Medicaid Other

## 2014-08-05 ENCOUNTER — Encounter: Payer: Self-pay | Admitting: Student

## 2014-12-02 ENCOUNTER — Emergency Department (HOSPITAL_COMMUNITY)
Admission: EM | Admit: 2014-12-02 | Discharge: 2014-12-02 | Disposition: A | Discharge status: Home / Self Care | Payer: Medicaid Other | Attending: Emergency Medicine | Admitting: Emergency Medicine

## 2014-12-02 ENCOUNTER — Encounter (HOSPITAL_COMMUNITY): Payer: Self-pay

## 2014-12-02 DIAGNOSIS — M545 Low back pain: Secondary | ICD-10-CM | POA: Insufficient documentation

## 2014-12-02 DIAGNOSIS — Z3202 Encounter for pregnancy test, result negative: Secondary | ICD-10-CM | POA: Insufficient documentation

## 2014-12-02 DIAGNOSIS — K439 Ventral hernia without obstruction or gangrene: Secondary | ICD-10-CM

## 2014-12-02 DIAGNOSIS — Z79899 Other long term (current) drug therapy: Secondary | ICD-10-CM | POA: Insufficient documentation

## 2014-12-02 DIAGNOSIS — Z8632 Personal history of gestational diabetes: Secondary | ICD-10-CM | POA: Insufficient documentation

## 2014-12-02 LAB — COMPREHENSIVE METABOLIC PANEL
ALBUMIN: 3.8 g/dL (ref 3.5–5.0)
ALK PHOS: 82 U/L (ref 38–126)
ALT: 13 U/L — ABNORMAL LOW (ref 14–54)
ANION GAP: 9 (ref 5–15)
AST: 19 U/L (ref 15–41)
BILIRUBIN TOTAL: 0.2 mg/dL — AB (ref 0.3–1.2)
BUN: 6 mg/dL (ref 6–20)
CALCIUM: 9.4 mg/dL (ref 8.9–10.3)
CO2: 24 mmol/L (ref 22–32)
CREATININE: 0.65 mg/dL (ref 0.44–1.00)
Chloride: 105 mmol/L (ref 101–111)
GFR calc Af Amer: >60 mL/min (ref >60)
GFR calc non Af Amer: >60 mL/min (ref >60)
GLUCOSE: 103 mg/dL — AB (ref 65–99)
Potassium: 3.3 mmol/L — ABNORMAL LOW (ref 3.5–5.1)
SODIUM: 138 mmol/L (ref 135–145)
Total Protein: 6.7 g/dL (ref 6.5–8.1)

## 2014-12-02 LAB — URINALYSIS, ROUTINE W REFLEX MICROSCOPIC
BILIRUBIN URINE: NEGATIVE
GLUCOSE, UA: NEGATIVE mg/dL
HGB URINE DIPSTICK: NEGATIVE
Ketones, ur: NEGATIVE mg/dL
Leukocytes, UA: NEGATIVE
Nitrite: NEGATIVE
PROTEIN: NEGATIVE mg/dL
Specific Gravity, Urine: 1.013 (ref 1.005–1.030)
Urobilinogen, UA: 0.2 mg/dL (ref 0.0–1.0)
pH: 6.5 (ref 5.0–8.0)

## 2014-12-02 LAB — CBC
HCT: 35.8 % — ABNORMAL LOW (ref 36.0–46.0)
HEMOGLOBIN: 12.4 g/dL (ref 12.0–15.0)
MCH: 28.8 pg (ref 26.0–34.0)
MCHC: 34.6 g/dL (ref 30.0–36.0)
MCV: 83.1 fL (ref 78.0–100.0)
Platelets: 201 10*3/uL (ref 150–400)
RBC: 4.31 MIL/uL (ref 3.87–5.11)
RDW: 13.3 % (ref 11.5–15.5)
WBC: 7.6 10*3/uL (ref 4.0–10.5)

## 2014-12-02 LAB — POC URINE PREG, ED: Preg Test, Ur: NEGATIVE

## 2014-12-02 LAB — LIPASE, BLOOD: Lipase: 24 U/L (ref 22–51)

## 2014-12-02 MED ORDER — HYDROCODONE-ACETAMINOPHEN 5-325 MG PO TABS: 1.0000 | ORAL_TABLET | Freq: Four times a day (QID) | ORAL | Status: DC | PRN

## 2014-12-02 MED ORDER — HYDROCODONE-ACETAMINOPHEN 5-325 MG PO TABS
1.0000 | ORAL_TABLET | Freq: Once | ORAL | Status: AC
  Administered 2014-12-02: 1 via ORAL
  Filled 2014-12-02: qty 1

## 2014-12-02 NOTE — ED Notes (Signed)
Pt was working [employer]ar[employer] and started having mid lower abd pain. No nausea or vomiting. Has a hernia and states that is what is hurting.

## 2014-12-02 NOTE — ED Provider Notes (Signed)
CSN: [id number]     Arrival date & time 12/02/14  0048 History  This chart was scribed for Loren Racer, MD by Lyndel Safe, ED Scribe. This patient was seen in room A07C/A07C and the patient's care was started 3:14 AM.   Chief Complaint  Patient presents with  . Abdominal Pain  . Back Pain   Patient is a 24 y.o. female presenting with back pain. The history is provided by the patient. No language interpreter was used.  Back Pain Associated symptoms: abdominal pain   Associated symptoms: no chest pain, no dysuria, no fever, no headaches, no numbness and no weakness    HPI Comments: Traci [name] is a 24 y.o. 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  She states her pain intermittently radiates to her back but is not currently. She notes a past similar experience with this pain but reports her past pain to be milder than her current pain. Denies nausea, vomiting, constipation, dysuria or vaginal symptoms..   Past Medical History  Diagnosis Date  . Hernia   . Gestational diabetes   . Preeclampsia    Past Surgical History  Procedure Laterality Date  . Tooth extaction     Family History  Problem Relation Age of Onset  . Diabetes Maternal Grandmother    Social History  Substance Use Topics  . Smoking status: Never Smoker   . Smokeless tobacco: Never Used  . Alcohol Use: No   OB History    Gravida Para Term Preterm AB TAB SAB Ectopic Multiple Living   3 3 3       0 3     Review of Systems  Constitutional: Negative for fever and chills.  Respiratory: Negative for shortness of breath.   Cardiovascular: Negative for chest pain.  Gastrointestinal: Positive for abdominal pain. Negative for nausea, vomiting, diarrhea and constipation.  Genitourinary: Negative for dysuria and flank pain.  Musculoskeletal: Positive for back  pain. Negative for neck pain and neck stiffness.  Neurological: Negative for dizziness, weakness, light-headedness, numbness and headaches.  All other systems reviewed and are negative.  Allergies  Review of patient's allergies indicates no known allergies.  Home Medications   Prior to Admission medications   Medication Sig Start Date End Date Taking? Authorizing Provider  HYDROcodone-acetaminophen (NORCO/VICODIN) 5-325 MG per tablet Take 1 tablet by mouth every 6 (six) hours as needed for moderate pain or severe pain. 12/02/14   Loren Racer, MD  Prenatal Vit-Fe Fumarate-FA (PRENATAL MULTIVITAMIN) TABS tablet Take 1 tablet by mouth daily. 11/18/13   Jethro Bastos Anyanwu, MD   BP 113/80 mmHg  Pulse 58  Temp(Src) 98.2 F (36.8 C) (Oral)  Resp 18  SpO2 98%  LMP   Breastfeeding? Yes Physical Exam  Constitutional: She is oriented to person, place, and time. She appears well-developed and well-nourished. No distress.  HENT:  Head: Normocephalic and atraumatic.  Mouth/Throat: Oropharynx is clear and moist.  Eyes: EOM are normal. Pupils are equal, round, and reactive to light.  Neck: Normal range of motion. Neck supple.  Cardiovascular: Normal rate and regular rhythm.  Exam reveals no gallop and no friction rub.   No murmur heard. Pulmonary/Chest: Effort normal and breath sounds normal. No respiratory distress. She has no wheezes. She has no rales.  Abdominal: Soft. Bowel sounds are normal. She exhibits mass. She exhibits no distension.  There is tenderness. There is no rebound and no guarding.  Patient with small soft ventral hernia that was reduced in the emergency department. There is tenderness over this site.  Musculoskeletal: Normal range of motion. She exhibits no edema or tenderness.  No CVA tenderness bilaterally.  Neurological: She is alert and oriented to person, place, and time.  Skin: Skin is warm and dry. No rash noted. No erythema.  Psychiatric: She has a normal mood and  affect. Her behavior is normal.  Nursing note and vitals reviewed.   ED Course  Procedures  DIAGNOSTIC STUDIES: Oxygen Saturation is 98% on RA, normal by my interpretation.    COORDINATION OF CARE: 3:17 AM Discussed treatment plan which includes to order diagnostic labs with pt. Pt acknowledges and agrees to plan.   Labs Review Labs Reviewed  COMPREHENSIVE METABOLIC PANEL - Abnormal; Notable for the following:    Potassium 3.3 (*)    Glucose, Bld 103 (*)    ALT 13 (*)    Total Bilirubin 0.2 (*)    All other components within normal limits  CBC - Abnormal; Notable for the following:    HCT 35.8 (*)    All other components within normal limits  LIPASE, BLOOD  URINALYSIS, ROUTINE W REFLEX MICROSCOPIC (NOT AT [facility])  POC URINE PREG, ED    Imaging Review No results found. I have personally reviewed and evaluated these images and lab results as part of my medical decision-making.   EKG Interpretation None      MDM   Final diagnoses:  Ventral hernia without obstruction or gangrene    I personally performed the services described in this documentation, which was scribed in my presence. The recorded information has been reviewed and is accurate.  Patient with ventral hernia that was reduced in the emergency department. Advised patient to follow-up with general surgery for likely repair.   Loren Racer, MD 12/02/14 0500

## 2014-12-02 NOTE — Discharge Instructions (Signed)
Avoid any heavy lifting or straining. Call and make an appointment to follow-up with the surgeon. Return immediately for worsening pain, persistent vomiting or any concerns.  Hernia A hernia occurs when an internal organ pushes out through a weak spot in the abdominal wall. Hernias most commonly occur in the groin and around the navel. Hernias often can be pushed back into place (reduced). Most hernias tend to get worse over time. Some abdominal hernias can get stuck in the opening (irreducible or incarcerated hernia) and cannot be reduced. An irreducible abdominal hernia which is tightly squeezed into the opening is at risk for impaired blood supply (strangulated hernia). A strangulated hernia is a medical emergency. Because of the risk for an irreducible or strangulated hernia, surgery may be recommended to repair a hernia. CAUSES   Heavy lifting.  Prolonged coughing.  Straining to have a bowel movement.  A cut (incision) made during an abdominal surgery. HOME CARE INSTRUCTIONS   Bed rest is not required. You may continue your normal activities.  Avoid lifting more than 10 pounds (4.5 kg) or straining.  Cough gently. If you are a smoker it is best to stop. Even the best hernia repair can break down with the continual strain of coughing. Even if you do not have your hernia repaired, a cough will continue to aggravate the problem.  Do not wear anything tight over your hernia. Do not try to keep it in with an outside bandage or truss. These can damage abdominal contents if they are trapped within the hernia sac.  Eat a normal diet.  Avoid constipation. Straining over long periods of time will increase hernia size and encourage breakdown of repairs. If you cannot do this with diet alone, stool softeners may be used. SEEK IMMEDIATE MEDICAL CARE IF:   You have a fever.  You develop increasing abdominal pain.  You feel nauseous or vomit.  Your hernia is stuck outside the abdomen, looks  discolored, feels hard, or is tender.  You have any changes in your bowel habits or in the hernia that are unusual for you.  You have increased pain or swelling around the hernia.  You cannot push the hernia back in place by applying gentle pressure while lying down. MAKE SURE YOU:   Understand these instructions.  Will watch your condition.  Will get help right away if you are not doing well or get worse. Document Released: 03/26/2005 Document Revised: 06/18/2011 Document Reviewed: 11/13/2007 [facility] Patient Information 2015 [place], [place]. This information is not intended to replace advice given to you by your health care provider. Make sure you discuss any questions you have with your health care provider.

## 2015-01-06 ENCOUNTER — Ambulatory Visit: Payer: Medicaid Other

## 2015-01-07 ENCOUNTER — Ambulatory Visit (INDEPENDENT_AMBULATORY_CARE_PROVIDER_SITE_OTHER): Payer: Medicaid Other | Admitting: General Practice

## 2015-01-07 VITALS — BP 122/81 | HR 54 | Temp 98.4°F | Ht 63.0 in | Wt 153.7 lb

## 2015-01-07 DIAGNOSIS — Z3202 Encounter for pregnancy test, result negative: Secondary | ICD-10-CM | POA: Diagnosis present

## 2015-01-07 DIAGNOSIS — Z3042 Encounter for surveillance of injectable contraceptive: Secondary | ICD-10-CM

## 2015-01-07 LAB — POCT PREGNANCY, URINE: PREG TEST UR: NEGATIVE

## 2015-01-07 MED ORDER — MEDROXYPROGESTERONE ACETATE 150 MG/ML IM SUSP
150.0000 mg | Freq: Once | INTRAMUSCULAR | Status: AC
  Administered 2015-01-07: 150 mg via INTRAMUSCULAR

## 2015-01-07 NOTE — Progress Notes (Signed)
Patient's last depo shot was in February. Obtained negative upt. Will give shot

## 2015-03-16 ENCOUNTER — Ambulatory Visit: Payer: Medicaid Other

## 2015-03-17 ENCOUNTER — Ambulatory Visit: Payer: Medicaid Other

## 2015-04-22 DIAGNOSIS — Z114 Encounter for screening for human immunodeficiency virus [HIV]: Secondary | ICD-10-CM | POA: Diagnosis not present

## 2015-04-22 DIAGNOSIS — Z30013 Encounter for initial prescription of injectable contraceptive: Secondary | ICD-10-CM | POA: Diagnosis not present

## 2015-04-22 DIAGNOSIS — Z3202 Encounter for pregnancy test, result negative: Secondary | ICD-10-CM | POA: Diagnosis not present

## 2015-04-22 DIAGNOSIS — Z113 Encounter for screening for infections with a predominantly sexual mode of transmission: Secondary | ICD-10-CM | POA: Diagnosis not present

## 2015-04-26 ENCOUNTER — Other Ambulatory Visit: Payer: Self-pay | Admitting: Surgery

## 2015-04-26 DIAGNOSIS — K429 Umbilical hernia without obstruction or gangrene: Secondary | ICD-10-CM | POA: Diagnosis not present

## 2015-04-26 DIAGNOSIS — Z01818 Encounter for other preprocedural examination: Secondary | ICD-10-CM | POA: Diagnosis not present

## 2015-04-26 DIAGNOSIS — K439 Ventral hernia without obstruction or gangrene: Secondary | ICD-10-CM | POA: Diagnosis not present

## 2015-04-26 NOTE — H&P (Signed)
Traci [name] 04/26/2015 11:00 AM Location: [facility] Patient #: [phone] DOB: 12/29/1990 Single / Language: [place] / Race: Black or African American Female   History of Present Illness Ardeth Sportsman MD; 04/26/2015 11:51 AM) Patient words: reck.  The patient is a 25 year old female who presents with an incisional hernia. Note for "Incisional hernia": Patient sent from emergency department. Dr. Loren Racer. Concern for symptomatic hernia.  Pleasant active young female. Works as a Agricultural engineer at [employer]. Some swelling above her belly button 5 years ago with pregnancy. Sounds like they thought it might have been a diastases. Unfortunately worsened over the years. Was out all the time. Become more uncomfortable. Diagnosed with hernia in the summertime. Surgical consultation recommended. His gone more bothersome recently. She's never abdominal surgeries. Normally has BM about every day. No bowel bouts of constipation or diarrhea. No history of skin infection or MRSA.   Other Problems Gilmer Mor, CMA; 04/26/2015 11:00 AM) Inguinal Hernia  Past Surgical History Lamar Laundry Bynum, CMA; 04/26/2015 11:00 AM) No pertinent past surgical history  Diagnostic Studies History Gilmer Mor, CMA; 04/26/2015 11:00 AM) Colonoscopy never Mammogram never Pap Smear 1-5 years ago  Allergies Lamar Laundry Bynum, CMA; 04/26/2015 11:01 AM) No Known Drug Allergies01/17/2017  Medication History (Sonya Bynum, CMA; 04/26/2015 11:01 AM) No Current Medications Medications Reconciled  Social History Gilmer Mor, CMA; 04/26/2015 11:00 AM) Alcohol use Occasional alcohol use. Caffeine use Coffee. No drug use Tobacco use Never smoker.  Family History Gilmer Mor, CMA; 04/26/2015 11:00 AM) Family history unknown First Degree Relatives  Pregnancy / Birth History Gilmer Mor, CMA; 04/26/2015 11:00 AM) Age at menarche 10 years. Contraceptive History  Depo-provera. Gravida 3 Irregular periods Maternal age 60-20 Para 3    Review of Systems Lamar Laundry Bynum CMA; 04/26/2015 11:00 AM) General Not Present- Appetite Loss, Chills, Fatigue, Fever, Night Sweats, Weight Gain and Weight Loss. Skin Not Present- Change in Wart/Mole, Dryness, Hives, Jaundice, New Lesions, Non-Healing Wounds, Rash and Ulcer. HEENT Not Present- Earache, Hearing Loss, Hoarseness, Nose Bleed, Oral Ulcers, Ringing in the Ears, Seasonal Allergies, Sinus Pain, Sore Throat, Visual Disturbances, Wears glasses/contact lenses and Yellow Eyes. Respiratory Not Present- Bloody sputum, Chronic Cough, Difficulty Breathing, Snoring and Wheezing. Breast Not Present- Breast Mass, Breast Pain, Nipple Discharge and Skin Changes. Cardiovascular Not Present- Chest Pain, Difficulty Breathing Lying Down, Leg Cramps, Palpitations, Rapid Heart Rate, Shortness of Breath and Swelling of Extremities. Gastrointestinal Not Present- Abdominal Pain, Bloating, Bloody Stool, Change in Bowel Habits, Chronic diarrhea, Constipation, Difficulty Swallowing, Excessive gas, Gets full quickly at meals, Hemorrhoids, Indigestion, Nausea, Rectal Pain and Vomiting. Female Genitourinary Not Present- Frequency, Nocturia, Painful Urination, Pelvic Pain and Urgency. Musculoskeletal Not Present- Back Pain, Joint Pain, Joint Stiffness, Muscle Pain, Muscle Weakness and Swelling of Extremities. Neurological Not Present- Decreased Memory, Fainting, Headaches, Numbness, Seizures, Tingling, Tremor, Trouble walking and Weakness. Psychiatric Not Present- Anxiety, Bipolar, Change in Sleep Pattern, Depression, Fearful and Frequent crying. Endocrine Not Present- Cold Intolerance, Excessive Hunger, Hair Changes, Heat Intolerance, Hot flashes and New Diabetes. Hematology Not Present- Easy Bruising, Excessive bleeding, Gland problems, HIV and Persistent Infections.  Vitals (Sonya Bynum CMA; 04/26/2015 11:01 AM) 04/26/2015 11:01  AM Weight: 150 lb Height: 63in Body Surface Area: 1.71 m Body Mass Index: 26.57 kg/m  Temp.: 97.68F(Temporal)  Pulse: 77 (Regular)  BP: 126/76 (Sitting, Left Arm, Standard)       Physical Exam Ardeth Sportsman MD; 04/26/2015 11:44 AM) General Mental Status-Alert. General Appearance-Not in acute distress, Not Sickly.  Orientation-Oriented X3. Hydration-Well hydrated. Voice-Normal.  Integumentary Global Assessment Upon inspection and palpation of skin surfaces of the - Axillae: non-tender, no inflammation or ulceration, no drainage. and Distribution of scalp and body hair is normal. General Characteristics Temperature - normal warmth is noted.  Head and Neck Head-normocephalic, atraumatic with no lesions or palpable masses. Face Global Assessment - atraumatic, no absence of expression. Neck Global Assessment - no abnormal movements, no bruit auscultated on the right, no bruit auscultated on the left, no decreased range of motion, non-tender. Trachea-midline. Thyroid Gland Characteristics - non-tender.  Eye Eyeball - Left-Extraocular movements intact, No Nystagmus. Eyeball - Right-Extraocular movements intact, No Nystagmus. Cornea - Left-No Hazy. Cornea - Right-No Hazy. Sclera/Conjunctiva - Left-No scleral icterus, No Discharge. Sclera/Conjunctiva - Right-No scleral icterus, No Discharge. Pupil - Left-Direct reaction to light normal. Pupil - Right-Direct reaction to light normal.  ENMT Ears Pinna - Left - no drainage observed, no generalized tenderness observed. Right - no drainage observed, no generalized tenderness observed. Nose and Sinuses External Inspection of the Nose - no destructive lesion observed. Inspection of the nares - Left - quiet respiration. Right - quiet respiration. Mouth and Throat Lips - Upper Lip - no fissures observed, no pallor noted. Lower Lip - no fissures observed, no pallor noted. Nasopharynx - no  discharge present. Oral Cavity/Oropharynx - Tongue - no dryness observed. Oral Mucosa - no cyanosis observed. Hypopharynx - no evidence of airway distress observed.  Chest and Lung Exam Inspection Movements - Normal and Symmetrical. Accessory muscles - No use of accessory muscles in breathing. Palpation Palpation of the chest reveals - Non-tender. Auscultation Breath sounds - Normal and Clear.  Cardiovascular Auscultation Rhythm - Regular. Murmurs & Other Heart Sounds - Auscultation of the heart reveals - No Murmurs and No Systolic Clicks.  Abdomen Inspection Inspection of the abdomen reveals - No Visible peristalsis and No Abnormal pulsations. Umbilicus - No Bleeding, No Urine drainage. Palpation/Percussion Palpation and Percussion of the abdomen reveal - Soft, Non Tender, No Rebound tenderness, No Rigidity (guarding) and No Cutaneous hyperesthesia. Note: Moderate suprapubic umbilical diastases recti. 6 x 5 cm swelling reducible to a 3x2cm epigastric ventral hernia. 1 cm umbilical hernia.   Female Genitourinary Sexual Maturity Tanner 5 - Adult hair pattern. Note: No vaginal bleeding nor discharge   Peripheral Vascular Upper Extremity Inspection - Left - No Cyanotic nailbeds, Not Ischemic. Right - No Cyanotic nailbeds, Not Ischemic.  Neurologic Neurologic evaluation reveals -normal attention span and ability to concentrate, able to name objects and repeat phrases. Appropriate fund of knowledge , normal sensation and normal coordination. Mental Status Affect - not angry, not paranoid. Cranial Nerves-Normal Bilaterally. Gait-Normal.  Neuropsychiatric Mental status exam performed with findings of-able to articulate well with normal speech/language, rate, volume and coherence, thought content normal with ability to perform basic computations and apply abstract reasoning and no evidence of hallucinations, delusions, obsessions or homicidal/suicidal  ideation.  Musculoskeletal Global Assessment Spine, Ribs and Pelvis - no instability, subluxation or laxity. Right Upper Extremity - no instability, subluxation or laxity.  Lymphatic Head & Neck  General Head & Neck Lymphatics: Bilateral - Description - No Localized lymphadenopathy. Axillary  General Axillary Region: Bilateral - Description - No Localized lymphadenopathy. Femoral & Inguinal  Generalized Femoral & Inguinal Lymphatics: Left - Description - No Localized lymphadenopathy. Right - Description - No Localized lymphadenopathy.    Assessment & Plan Ardeth Sportsman MD; 04/26/2015 11:46 AM) EPIGASTRIC HERNIA (K43.9) Impression: Supraumbilical ventral hernia. Reducible. I think she would  benefit from surgical repair. He has no umbilical hernias well and setting and diastases and young age, I would think she would benefit from underlay repair laparoscopically with mesh. She agrees.  We'll work to coordinate convenient time. UMBILICAL HERNIA WITHOUT OBSTRUCTION AND WITHOUT GANGRENE (K42.9) Impression: Small umbilical hernia within diastases. Reducible. Think she would benefit from repair. We will have the mesh cover that region as well. ENCOUNTER FOR PRE-OPERATIVE EXAMINATION - [facility] (N82.956) Current Plans You are being scheduled for surgery - Our schedulers will call you.  You should hear from our office's scheduling department within 5 working days about the location, date, and time of surgery. We try to make accommodations for patient's preferences in scheduling surgery, but sometimes the OR schedule or the surgeon's schedule prevents [place] from making those accommodations.  If you have not heard from our office [phone]) in 5 working days, call the office and ask for your surgeon's nurse.  If you have other questions about your diagnosis, plan, or surgery, call the office and ask for your surgeon's nurse.  Written instructions provided Pt Education - Pamphlet Given -  Laparoscopic Hernia Repair: discussed with patient and provided information. The anatomy & physiology of the abdominal wall was discussed. The pathophysiology of hernias was discussed. Natural history risks without surgery including progeressive enlargement, pain, incarceration, & strangulation was discussed. Contributors to complications such as smoking, obesity, diabetes, prior surgery, etc were discussed.  I feel the risks of no intervention will lead to serious problems that outweigh the operative risks; therefore, I recommended surgery to reduce and repair the hernia. I explained laparoscopic techniques with possible need for an open approach. I noted the probable use of mesh to patch and/or buttress the hernia repair  Risks such as bleeding, infection, abscess, need for further treatment, heart attack, death, and other risks were discussed. I noted a good likelihood this will help address the problem. Goals of post-operative recovery were discussed as well. Possibility that this will not correct all symptoms was explained. I stressed the importance of low-impact activity, aggressive pain control, avoiding constipation, & not pushing through pain to minimize risk of post-operative chronic pain or injury. Possibility of reherniation especially with smoking, obesity, diabetes, immunosuppression, and other health conditions was discussed. We will work to minimize complications.  An educational handout further explaining the pathology & treatment options was given as well. Questions were answered. The patient expresses understanding & wishes to proceed with surgery.  Ardeth Sportsman, M.D., F.A.C.S. Gastrointestinal and Minimally Invasive Surgery [facility] [address] [place], [place] [address] [phone] Main / Paging

## 2015-04-26 NOTE — H&P (Signed)
Traci [name] 04/26/2015 11:00 AM Location: [facility] Patient #: [id number] DOB: 09/18/1990 Single / Language: English / Race: Black or African American Female   History of Present Illness (Shaunessy Dobratz C. Shantella Blubaugh MD; 04/26/2015 11:51 AM) Patient words: reck.  The patient is a 24 year old female who presents with an incisional hernia. Note for "Incisional hernia": Patient sent from emergency department. Dr. David Yelverton. Concern for symptomatic hernia.  Pleasant active young female. 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 Some swelling above her belly button 5 years ago with pregnancy. Sounds like they thought it might have been a diastases. Unfortunately worsened over the years. Was out all the time. Become more uncomfortable. Diagnosed with hernia in the summertime. Surgical consultation recommended. His gone more bothersome recently. She's never abdominal surgeries. Normally has BM about every day. No bowel bouts of constipation or diarrhea. No history of skin infection or MRSA.   Other Problems (Sonya Bynum, CMA; 04/26/2015 11:00 AM) Inguinal Hernia  Past Surgical History (Sonya Bynum, CMA; 04/26/2015 11:00 AM) No pertinent past surgical history  Diagnostic Studies History (Sonya Bynum, CMA; 04/26/2015 11:00 AM) Colonoscopy never Mammogram never Pap Smear 1-5 years ago  Allergies (Sonya Bynum, CMA; 04/26/2015 11:01 AM) No Known Drug Allergies01/17/2017  Medication History (Sonya Bynum, CMA; 04/26/2015 11:01 AM) No Current Medications Medications Reconciled  Social History (Sonya Bynum, CMA; 04/26/2015 11:00 AM) Alcohol use Occasional alcohol use. Caffeine use Coffee. No drug use Tobacco use Never smoker.  Family History (Sonya Bynum, CMA; 04/26/2015 11:00 AM) Family history unknown First Degree Relatives  Pregnancy / Birth History (Sonya Bynum, CMA; 04/26/2015 11:00 AM) Age at menarche 10 years. Contraceptive History  Depo-provera. Gravida 3 Irregular periods Maternal age 15-20 Para 3    Review of Systems (Sonya Bynum CMA; 04/26/2015 11:00 AM) General Not Present- Appetite Loss, Chills, Fatigue, Fever, Night Sweats, Weight Gain and Weight Loss. Skin Not Present- Change in Wart/Mole, Dryness, Hives, Jaundice, New Lesions, Non-Healing Wounds, Rash and Ulcer. HEENT Not Present- Earache, Hearing Loss, Hoarseness, Nose Bleed, Oral Ulcers, Ringing in the Ears, Seasonal Allergies, Sinus Pain, Sore Throat, Visual Disturbances, Wears glasses/contact lenses and Yellow Eyes. Respiratory Not Present- Bloody sputum, Chronic Cough, Difficulty Breathing, Snoring and Wheezing. Breast Not Present- Breast Mass, Breast Pain, Nipple Discharge and Skin Changes. Cardiovascular Not Present- Chest Pain, Difficulty Breathing Lying Down, Leg Cramps, Palpitations, Rapid Heart Rate, Shortness of Breath and Swelling of Extremities. Gastrointestinal Not Present- Abdominal Pain, Bloating, Bloody Stool, Change in Bowel Habits, Chronic diarrhea, Constipation, Difficulty Swallowing, Excessive gas, Gets full quickly at meals, Hemorrhoids, Indigestion, Nausea, Rectal Pain and Vomiting. Female Genitourinary Not Present- Frequency, Nocturia, Painful Urination, Pelvic Pain and Urgency. Musculoskeletal Not Present- Back Pain, Joint Pain, Joint Stiffness, Muscle Pain, Muscle Weakness and Swelling of Extremities. Neurological Not Present- Decreased Memory, Fainting, Headaches, Numbness, Seizures, Tingling, Tremor, Trouble walking and Weakness. Psychiatric Not Present- Anxiety, Bipolar, Change in Sleep Pattern, Depression, Fearful and Frequent crying. Endocrine Not Present- Cold Intolerance, Excessive Hunger, Hair Changes, Heat Intolerance, Hot flashes and New Diabetes. Hematology Not Present- Easy Bruising, Excessive bleeding, Gland problems, HIV and Persistent Infections.  Vitals (Sonya Bynum CMA; 04/26/2015 11:01 AM) 04/26/2015 11:01  AM Weight: 150 lb Height: 63in Body Surface Area: 1.71 m Body Mass Index: 26.57 kg/m  Temp.: 97.5F(Temporal)  Pulse: 77 (Regular)  BP: 126/76 (Sitting, Left Arm, Standard)       Physical Exam (Rishan Oyama C. Korene Dula MD; 04/26/2015 11:44 AM) General Mental Status-Alert. General Appearance-Not in acute distress, Not Sickly.   Orientation-Oriented X3. Hydration-Well hydrated. Voice-Normal.  Integumentary Global Assessment Upon inspection and palpation of skin surfaces of the - Axillae: non-tender, no inflammation or ulceration, no drainage. and Distribution of scalp and body hair is normal. General Characteristics Temperature - normal warmth is noted.  Head and Neck Head-normocephalic, atraumatic with no lesions or palpable masses. Face Global Assessment - atraumatic, no absence of expression. Neck Global Assessment - no abnormal movements, no bruit auscultated on the right, no bruit auscultated on the left, no decreased range of motion, non-tender. Trachea-midline. Thyroid Gland Characteristics - non-tender.  Eye Eyeball - Left-Extraocular movements intact, No Nystagmus. Eyeball - Right-Extraocular movements intact, No Nystagmus. Cornea - Left-No Hazy. Cornea - Right-No Hazy. Sclera/Conjunctiva - Left-No scleral icterus, No Discharge. Sclera/Conjunctiva - Right-No scleral icterus, No Discharge. Pupil - Left-Direct reaction to light normal. Pupil - Right-Direct reaction to light normal.  ENMT Ears Pinna - Left - no drainage observed, no generalized tenderness observed. Right - no drainage observed, no generalized tenderness observed. Nose and Sinuses External Inspection of the Nose - no destructive lesion observed. Inspection of the nares - Left - quiet respiration. Right - quiet respiration. Mouth and Throat Lips - Upper Lip - no fissures observed, no pallor noted. Lower Lip - no fissures observed, no pallor noted. Nasopharynx - no  discharge present. Oral Cavity/Oropharynx - Tongue - no dryness observed. Oral Mucosa - no cyanosis observed. Hypopharynx - no evidence of airway distress observed.  Chest and Lung Exam Inspection Movements - Normal and Symmetrical. Accessory muscles - No use of accessory muscles in breathing. Palpation Palpation of the chest reveals - Non-tender. Auscultation Breath sounds - Normal and Clear.  Cardiovascular Auscultation Rhythm - Regular. Murmurs & Other Heart Sounds - Auscultation of the heart reveals - No Murmurs and No Systolic Clicks.  Abdomen Inspection Inspection of the abdomen reveals - No Visible peristalsis and No Abnormal pulsations. Umbilicus - No Bleeding, No Urine drainage. Palpation/Percussion Palpation and Percussion of the abdomen reveal - Soft, Non Tender, No Rebound tenderness, No Rigidity (guarding) and No Cutaneous hyperesthesia. Note: Moderate suprapubic umbilical diastases recti. 6 x 5 cm swelling reducible to a 3x2cm epigastric ventral hernia. 1 cm umbilical hernia.   Female Genitourinary Sexual Maturity Tanner 5 - Adult hair pattern. Note: No vaginal bleeding nor discharge   Peripheral Vascular Upper Extremity Inspection - Left - No Cyanotic nailbeds, Not Ischemic. Right - No Cyanotic nailbeds, Not Ischemic.  Neurologic Neurologic evaluation reveals -normal attention span and ability to concentrate, able to name objects and repeat phrases. Appropriate fund of knowledge , normal sensation and normal coordination. Mental Status Affect - not angry, not paranoid. Cranial Nerves-Normal Bilaterally. Gait-Normal.  Neuropsychiatric Mental status exam performed with findings of-able to articulate well with normal speech/language, rate, volume and coherence, thought content normal with ability to perform basic computations and apply abstract reasoning and no evidence of hallucinations, delusions, obsessions or homicidal/suicidal  ideation.  Musculoskeletal Global Assessment Spine, Ribs and Pelvis - no instability, subluxation or laxity. Right Upper Extremity - no instability, subluxation or laxity.  Lymphatic Head & Neck  General Head & Neck Lymphatics: Bilateral - Description - No Localized lymphadenopathy. Axillary  General Axillary Region: Bilateral - Description - No Localized lymphadenopathy. Femoral & Inguinal  Generalized Femoral & Inguinal Lymphatics: Left - Description - No Localized lymphadenopathy. Right - Description - No Localized lymphadenopathy.    Assessment & Plan (Peregrine Nolt C. Joyous Gleghorn MD; 04/26/2015 11:46 AM) EPIGASTRIC HERNIA (K43.9) Impression: Supraumbilical ventral hernia. Reducible. I think she would   benefit from surgical repair. He has no umbilical hernias well and setting and diastases and young age, I would think she would benefit from underlay repair laparoscopically with mesh. She agrees.  We'll work to coordinate convenient time. UMBILICAL HERNIA WITHOUT OBSTRUCTION AND WITHOUT GANGRENE (K42.9) Impression: Small umbilical hernia within diastases. Reducible. Think she would benefit from repair. We will have the mesh cover that region as well. ENCOUNTER FOR PRE-OPERATIVE EXAMINATION - [facility] (Z01.818) Current Plans You are being scheduled for surgery - Our schedulers will call you.  You should hear from our office's scheduling department within 5 working days about the location, date, and time of surgery. We try to make accommodations for patient's preferences in scheduling surgery, but sometimes the OR schedule or the surgeon's schedule prevents us from making those accommodations.  If you have not heard from our office ([phone]) in 5 working days, call the office and ask for your surgeon's nurse.  If you have other questions about your diagnosis, plan, or surgery, call the office and ask for your surgeon's nurse.  Written instructions provided Pt Education - Pamphlet Given -  Laparoscopic Hernia Repair: discussed with patient and provided information. The anatomy & physiology of the abdominal wall was discussed. The pathophysiology of hernias was discussed. Natural history risks without surgery including progeressive enlargement, pain, incarceration, & strangulation was discussed. Contributors to complications such as smoking, obesity, diabetes, prior surgery, etc were discussed.  I feel the risks of no intervention will lead to serious problems that outweigh the operative risks; therefore, I recommended surgery to reduce and repair the hernia. I explained laparoscopic techniques with possible need for an open approach. I noted the probable use of mesh to patch and/or buttress the hernia repair  Risks such as bleeding, infection, abscess, need for further treatment, heart attack, death, and other risks were discussed. I noted a good likelihood this will help address the problem. Goals of post-operative recovery were discussed as well. Possibility that this will not correct all symptoms was explained. I stressed the importance of low-impact activity, aggressive pain control, avoiding constipation, & not pushing through pain to minimize risk of post-operative chronic pain or injury. Possibility of reherniation especially with smoking, obesity, diabetes, immunosuppression, and other health conditions was discussed. We will work to minimize complications.  An educational handout further explaining the pathology & treatment options was given as well. Questions were answered. The patient expresses understanding & wishes to proceed with surgery.  Deniyah Dillavou C. Delayna Sparlin, M.D., F.A.C.S. Gastrointestinal and Minimally Invasive Surgery [facility] [address], [address] [place], [place] [address] [phone] Main / Paging     

## 2015-05-04 ENCOUNTER — Ambulatory Visit: Payer: Self-pay | Admitting: Obstetrics & Gynecology

## 2015-05-24 NOTE — Patient Instructions (Signed)
YOUR PROCEDURE IS SCHEDULED ON :  06/01/15  REPORT TO [facility] MAIN ENTRANCE FOLLOW SIGNS TO EAST ELEVATOR - GO TO 3rd FLOOR CHECK IN AT 3 EAST NURSES STATION (SHORT STAY) AT: 10:30 AM  CALL THIS NUMBER IF YOU HAVE PROBLEMS THE MORNING OF SURGERY [phone]  REMEMBER:ONLY 1 PER PERSON MAY GO TO SHORT STAY WITH YOU TO GET READY THE MORNING OF YOUR SURGERY  DO NOT EAT FOOD OR DRINK LIQUIDS AFTER MIDNIGHT  TAKE THESE MEDICINES THE MORNING OF SURGERY:  YOU MAY NOT HAVE ANY METAL ON YOUR BODY INCLUDING HAIR PINS AND PIERCING'S. DO NOT WEAR JEWELRY, MAKEUP, LOTIONS, POWDERS OR PERFUMES. DO NOT WEAR NAIL POLISH. DO NOT SHAVE 48 HRS PRIOR TO SURGERY. MEN MAY SHAVE FACE AND NECK.  DO NOT BRING VALUABLES TO HOSPITAL. [facility] IS NOT RESPONSIBLE FOR VALUABLES.  CONTACTS, DENTURES OR PARTIALS MAY NOT BE WORN TO SURGERY. LEAVE SUITCASE IN CAR. CAN BE BROUGHT TO ROOM AFTER SURGERY.  PATIENTS DISCHARGED THE DAY OF SURGERY WILL NOT BE ALLOWED TO DRIVE HOME.  PLEASE READ OVER THE FOLLOWING INSTRUCTION SHEETS _________________________________________________________________________________                                          [facility] - PREPARING FOR SURGERY  Before surgery, you can play an important role.  Because skin is not sterile, your skin needs to be as free of germs as possible.  You can reduce the number of germs on your skin by washing with CHG (chlorahexidine gluconate) soap before surgery.  CHG is an antiseptic cleaner which kills germs and bonds with the skin to continue killing germs even after washing. Please DO NOT use if you have an allergy to CHG or antibacterial soaps.  If your skin becomes reddened/irritated stop using the CHG and inform your nurse when you arrive at Short Stay. Do not shave (including legs and underarms) for at least 48 hours prior to the first CHG shower.  You may shave your face. Please follow these instructions carefully:   1.   Shower with CHG Soap the night before surgery and the  morning of Surgery.   2.  If you choose to wash your hair, wash your hair first as usual with your  normal  Shampoo.   3.  After you shampoo, rinse your hair and body thoroughly to remove the  shampoo.                                         4.  Use CHG as you would any other liquid soap.  You can apply chg directly  to the skin and wash . Gently wash with scrungie or clean wascloth    5.  Apply the CHG Soap to your body ONLY FROM THE NECK DOWN.   Do not use on open                           Wound or open sores. Avoid contact with eyes, ears mouth and genitals (private parts).                        Genitals (private parts) with your normal soap.  6.  Wash thoroughly, paying special attention to the area where your surgery  will be performed.   7.  Thoroughly rinse your body with warm water from the neck down.   8.  DO NOT shower/wash with your normal soap after using and rinsing off  the CHG Soap .                9.  Pat yourself dry with a clean towel.             10.  Wear clean night clothes to bed after shower             11.  Place clean sheets on your bed the night of your first shower and do not  sleep with pets.  Day of Surgery : Do not apply any lotions/deodorants the morning of surgery.  Please wear clean clothes to the hospital/surgery center.  FAILURE TO FOLLOW THESE INSTRUCTIONS MAY RESULT IN THE CANCELLATION OF YOUR SURGERY    PATIENT SIGNATURE_________________________________  ______________________________________________________________________

## 2015-05-25 ENCOUNTER — Inpatient Hospital Stay (HOSPITAL_COMMUNITY)
Admission: RE | Admit: 2015-05-25 | Discharge: 2015-05-25 | Disposition: A | Discharge status: Home / Self Care | Payer: Self-pay | Source: Ambulatory Visit

## 2015-06-01 ENCOUNTER — Ambulatory Visit (HOSPITAL_COMMUNITY): Admission: RE | Admit: 2015-06-01 | Payer: Self-pay | Source: Ambulatory Visit | Admitting: Surgery

## 2015-06-01 ENCOUNTER — Encounter (HOSPITAL_COMMUNITY): Admission: RE | Payer: Self-pay | Source: Ambulatory Visit

## 2015-06-01 SURGERY — REPAIR, HERNIA, VENTRAL, LAPAROSCOPIC
Anesthesia: General

## 2016-05-01 ENCOUNTER — Emergency Department (HOSPITAL_COMMUNITY): Admission: EM | Admit: 2016-05-01 | Discharge: 2016-05-01 | Discharge status: Against Medical Advice | Payer: Self-pay

## 2016-05-01 NOTE — ED Notes (Signed)
No answer x 3 when called to triage.

## 2016-05-02 ENCOUNTER — Encounter (HOSPITAL_COMMUNITY): Payer: Self-pay | Admitting: Emergency Medicine

## 2016-05-02 ENCOUNTER — Emergency Department (HOSPITAL_COMMUNITY)
Admission: EM | Admit: 2016-05-02 | Discharge: 2016-05-02 | Disposition: A | Discharge status: Home / Self Care | Payer: Self-pay | Attending: Emergency Medicine | Admitting: Emergency Medicine

## 2016-05-02 DIAGNOSIS — J111 Influenza due to unidentified influenza virus with other respiratory manifestations: Secondary | ICD-10-CM | POA: Insufficient documentation

## 2016-05-02 LAB — INFLUENZA PANEL BY PCR (TYPE A & B)
INFLBPCR: NEGATIVE
Influenza A By PCR: POSITIVE — AB

## 2016-05-02 MED ORDER — IBUPROFEN 400 MG PO TABS
600.0000 mg | ORAL_TABLET | Freq: Once | ORAL | Status: AC
  Administered 2016-05-02: 600 mg via ORAL
  Filled 2016-05-02: qty 1

## 2016-05-02 MED ORDER — ACETAMINOPHEN 325 MG PO TABS
650.0000 mg | ORAL_TABLET | Freq: Once | ORAL | Status: AC | PRN
  Administered 2016-05-02: 650 mg via ORAL
  Filled 2016-05-02: qty 2

## 2016-05-02 NOTE — ED Notes (Signed)
Dr. Jeraldine LootsLockwood notified of patients symptoms and vitals. No new orders.

## 2016-05-02 NOTE — ED Triage Notes (Signed)
Pt states "i want to make sure I dont have the flu, I have cough, body aches, chills, hot and cold" Denies N/V. Also c/o headache. Pt in NAD, ambulatory. Tachy in triage at 120. Pt took nyquil last night but nothing today.

## 2016-05-02 NOTE — ED Provider Notes (Signed)
[facility]-EMERGENCY DEPT Provider Note   CSN: [id number] Arrival date & time: 05/02/16  0806     History   Chief Complaint Chief Complaint  Patient presents with  . Generalized Body Aches    HPI Traci [name] is a 26 y.o. female.  HPI  Patient presents with concern of nausea, myalgia, fever, cough. Illness began 4 days ago, initially with cough, generalized discomfort. Fever developed over the past 24 hours. Minimal relief with OTC cough medication. No confusion, no disorientation, no vomiting, diarrhea, no chest pain, no dyspnea. Patient did get her influenza shot this year. 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   Past Medical History:  Diagnosis Date  . Gestational diabetes   . Hernia   . Preeclampsia     Patient Active Problem List   Diagnosis Date Noted  . History of gestational diabetes mellitus 10/21/2013    Past Surgical History:  Procedure Laterality Date  . tooth extaction      OB History    Gravida Para Term Preterm AB Living   3 3 3     3    SAB TAB Ectopic Multiple Live Births         0 3       Home Medications    Prior to Admission medications   Medication Sig Start Date End Date Taking? Authorizing Provider  HYDROcodone-acetaminophen (NORCO/VICODIN) 5-325 MG per tablet Take 1 tablet by mouth every 6 (six) hours as needed for moderate pain or severe pain. Patient not taking: Reported on 05/12/2015 12/02/14   Loren Raceravid Yelverton, MD  Prenatal Vit-Fe Fumarate-FA (PRENATAL MULTIVITAMIN) TABS tablet Take 1 tablet by mouth daily. 11/18/13   Tereso NewcomerUgonna A Anyanwu, MD    Family History Family History  Problem Relation Age of Onset  . Diabetes Maternal Grandmother     Social History Social History  Substance Use Topics  . Smoking status: Never Smoker  . Smokeless tobacco: Never Used  . Alcohol use No     Allergies   Patient has no known allergies.   Review of Systems Review of Systems  Constitutional:   Per HPI, otherwise negative  HENT:       Per HPI, otherwise negative  Respiratory:       Per HPI, otherwise negative  Cardiovascular:       Per HPI, otherwise negative  Gastrointestinal: Negative for vomiting.  Endocrine:       Negative aside from HPI  Genitourinary:       Neg aside from HPI   Musculoskeletal:       Per HPI, otherwise negative  Skin: Negative.   Neurological: Negative for syncope.     Physical Exam Updated Vital Signs BP 125/84   Pulse (!) 123   Temp 99.5 F (37.5 C) (Oral)   Resp 18   LMP 04/08/2016   SpO2 96%   Physical Exam  Constitutional: She is oriented to person, place, and time. She appears well-developed and well-nourished. No distress.  HENT:  Head: Normocephalic and atraumatic.  Eyes: Conjunctivae and EOM are normal.  Cardiovascular: Regular rhythm.  Tachycardia present.   Pulmonary/Chest: Effort normal and breath sounds normal. No stridor. No respiratory distress.  Abdominal: Soft. She exhibits no distension. There is no tenderness. There is no guarding.  Musculoskeletal: She exhibits no edema.  Neurological: She is alert and oriented to person, place, and time. No cranial nerve deficit.  Skin: Skin is warm and dry.  Psychiatric: She has a normal  mood and affect.  Nursing note and vitals reviewed.    ED Treatments / Results  Labs (all labs ordered are listed, but only abnormal results are displayed) Labs Reviewed  INFLUENZA PANEL BY PCR (TYPE A & B) - Abnormal; Notable for the following:       Result Value   Influenza A By PCR POSITIVE (*)    All other components within normal limits    Procedures Procedures (including critical care time)  Medications Ordered in ED Medications  ibuprofen (ADVIL,MOTRIN) tablet 600 mg (not administered)  acetaminophen (TYLENOL) tablet 650 mg (650 mg Oral Given 05/02/16 0836)     Initial Impression / Assessment and Plan / ED Course  I have reviewed the triage vital signs and the nursing  notes.  Pertinent labs & imaging results that were available during my care of the patient were reviewed by me and considered in my medical decision making (see chart for details).     On repeat exam the patient is in no distress. We discussed findings with positive influenza result. I discussed the importance of appropriate preventative measures for progression of illness at home. 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  With no evidence for pneumonia, decompensated state, but with positive influenza test, the patient was discharged in stable condition. Influenza testing performed given the patient's healthcare occupation.  Patient is out of the window for influenza treatment with Tamiflu.   Final Clinical Impressions(s) / ED Diagnoses   Final diagnoses:  Influenza     Gerhard Munch, MD 05/02/16 1237

## 2017-03-05 ENCOUNTER — Encounter: Payer: Self-pay | Admitting: Obstetrics and Gynecology

## 2017-03-05 DIAGNOSIS — O0992 Supervision of high risk pregnancy, unspecified, second trimester: Secondary | ICD-10-CM | POA: Insufficient documentation

## 2017-03-18 ENCOUNTER — Encounter: Payer: Self-pay | Admitting: Obstetrics and Gynecology

## 2017-03-25 ENCOUNTER — Encounter: Payer: Medicaid Other | Admitting: Obstetrics and Gynecology

## 2017-03-25 ENCOUNTER — Other Ambulatory Visit: Payer: Self-pay | Admitting: *Deleted

## 2017-03-25 ENCOUNTER — Encounter: Payer: Self-pay | Admitting: Obstetrics and Gynecology

## 2017-03-25 DIAGNOSIS — K439 Ventral hernia without obstruction or gangrene: Secondary | ICD-10-CM | POA: Insufficient documentation

## 2017-03-25 DIAGNOSIS — Z3480 Encounter for supervision of other normal pregnancy, unspecified trimester: Secondary | ICD-10-CM

## 2017-03-26 ENCOUNTER — Encounter: Payer: Medicaid Other | Admitting: Obstetrics and Gynecology

## 2017-03-26 LAB — COMPREHENSIVE METABOLIC PANEL
ALBUMIN: 4.1 g/dL (ref 3.5–5.5)
ALK PHOS: 48 IU/L (ref 39–117)
ALT: 7 IU/L (ref 0–32)
AST: 11 IU/L (ref 0–40)
Albumin/Globulin Ratio: 1.5 (ref 1.2–2.2)
BUN / CREAT RATIO: 13 (ref 9–23)
BUN: 8 mg/dL (ref 6–20)
Bilirubin Total: <0.2 mg/dL (ref 0.0–1.2)
CO2: 21 mmol/L (ref 20–29)
CREATININE: 0.61 mg/dL (ref 0.57–1.00)
Calcium: 9.1 mg/dL (ref 8.7–10.2)
Chloride: 101 mmol/L (ref 96–106)
GFR calc Af Amer: 144 mL/min/{1.73_m2} (ref >59)
GFR calc non Af Amer: 125 mL/min/{1.73_m2} (ref >59)
GLOBULIN, TOTAL: 2.7 g/dL (ref 1.5–4.5)
Glucose: 88 mg/dL (ref 65–99)
Potassium: 3.7 mmol/L (ref 3.5–5.2)
SODIUM: 136 mmol/L (ref 134–144)
Total Protein: 6.8 g/dL (ref 6.0–8.5)

## 2017-03-26 LAB — OBSTETRIC PANEL, INCLUDING HIV
ANTIBODY SCREEN: NEGATIVE
BASOS: 0 %
Basophils Absolute: 0 10*3/uL (ref 0.0–0.2)
EOS (ABSOLUTE): 0 10*3/uL (ref 0.0–0.4)
Eos: 1 %
HEP B S AG: NEGATIVE
HIV SCREEN 4TH GENERATION: NONREACTIVE
Hematocrit: 36.9 % (ref 34.0–46.6)
Hemoglobin: 12.6 g/dL (ref 11.1–15.9)
Immature Grans (Abs): 0 10*3/uL (ref 0.0–0.1)
Immature Granulocytes: 0 %
LYMPHS ABS: 2 10*3/uL (ref 0.7–3.1)
LYMPHS: 24 %
MCH: 29.1 pg (ref 26.6–33.0)
MCHC: 34.1 g/dL (ref 31.5–35.7)
MCV: 85 fL (ref 79–97)
MONOCYTES: 7 %
MONOS ABS: 0.6 10*3/uL (ref 0.1–0.9)
NEUTROS ABS: 5.7 10*3/uL (ref 1.4–7.0)
Neutrophils: 68 %
Platelets: 210 10*3/uL (ref 150–379)
RBC: 4.33 x10E6/uL (ref 3.77–5.28)
RDW: 15 % (ref 12.3–15.4)
RPR Ser Ql: NONREACTIVE
Rh Factor: POSITIVE
Rubella Antibodies, IGG: 3.33 index (ref >0.99)
WBC: 8.4 10*3/uL (ref 3.4–10.8)

## 2017-03-26 LAB — HEMOGLOBIN A1C
Est. average glucose Bld gHb Est-mCnc: 108 mg/dL
HEMOGLOBIN A1C: 5.4 % (ref 4.8–5.6)

## 2017-03-26 LAB — TSH: TSH: 3.1 u[IU]/mL (ref 0.450–4.500)

## 2017-04-09 NOTE — L&D Delivery Note (Addendum)
Patient is 27 y.o. [id number] admitted IOL for mono/di twins and A2GDM. S/P Cytotec, Pitocin, SROM of Baby A, AROM of Kristian CoveyBaby B     Gerald, Boy [facility] [[id number]]  Delivery Note At 4:50 PM a viable female was delivered via Vaginal, Spontaneous (Presentation:OP ;  ).  APGAR: 8, 9; weight 6 lb 7.6 oz (2938 g).   Placenta status: delivered intact, .  Cord: 3V  Waymon BudgeC    Talley[facility] [[id number]]  Delivery Note At 5:33 PM a viable female was delivered via Vacuum Assisted Vaginal Delivery (Presentation: OP;  ).  APGAR: pending. Weight: pending Placenta status: delivered intact.  Cord: 3V  Cord pH: 7.11 pending  Anesthesia:  Epidural Episiotomy: None Lacerations:  None Suture Repair: None Est. Blood Loss (mL):  250  Upon arrival patient was complete and pushing. She pushed with good maternal effort to deliver her first boy, healthy and vibrant. Baby A delivered without difficulty, was noted to have good tone and place on maternal abdomen for oral suctioning, drying and stimulation. Baby B had multiple variable decels. He also did not quickly descend. Baby B amniotic membranes were artificially ruptured and KIWI Vacuum was applied. Baby B was delivered healthy. Cord pH was obtained. It was reassuring. Placenta delivered intact with 3V cord. Vaginal canal and perineum was inspected and had no laceration and was hemostatic. Pitocin was started and uterus massaged until bleeding slowed. Counts of sharps, instruments, and lap pads were all correct.  Mom to postpartum.  Baby to Couplet care / Skin to Skin.  Garnette Gunneraron B Thompson 09/14/2017, 5:48 PM  I was present with the resident for the entire delivery. Dr. Debroah LoopArnold was also present.   Thressa ShellerHeather Malachi Suderman 7:16 PM 09/14/17

## 2017-04-10 ENCOUNTER — Ambulatory Visit (INDEPENDENT_AMBULATORY_CARE_PROVIDER_SITE_OTHER): Payer: Medicaid Other | Admitting: Student

## 2017-04-10 ENCOUNTER — Encounter: Payer: Self-pay | Admitting: Student

## 2017-04-10 VITALS — BP 115/75 | HR 118 | Wt 164.2 lb

## 2017-04-10 DIAGNOSIS — O30092 Twin pregnancy, unable to determine number of placenta and number of amniotic sacs, second trimester: Secondary | ICD-10-CM

## 2017-04-10 DIAGNOSIS — Z3687 Encounter for antenatal screening for uncertain dates: Secondary | ICD-10-CM

## 2017-04-10 DIAGNOSIS — O30039 Twin pregnancy, monochorionic/diamniotic, unspecified trimester: Secondary | ICD-10-CM | POA: Insufficient documentation

## 2017-04-10 DIAGNOSIS — Z3480 Encounter for supervision of other normal pregnancy, unspecified trimester: Secondary | ICD-10-CM

## 2017-04-10 DIAGNOSIS — Z8632 Personal history of gestational diabetes: Secondary | ICD-10-CM

## 2017-04-10 DIAGNOSIS — O0992 Supervision of high risk pregnancy, unspecified, second trimester: Secondary | ICD-10-CM

## 2017-04-10 MED ORDER — ASPIRIN EC 81 MG PO TBEC: 81.0000 mg | DELAYED_RELEASE_TABLET | Freq: Every day | ORAL | 2 refills | Status: DC

## 2017-04-10 NOTE — Progress Notes (Signed)
Last pap 11/21/2013

## 2017-04-10 NOTE — Patient Instructions (Signed)
Multiple Pregnancy Having a multiple pregnancy means that a woman is carrying more than one baby at a time. She may be pregnant with twins, triplets, or more. The majority of multiple pregnancies are twins. Naturally conceiving triplets or more (higher-order multiples) is rare. Multiple pregnancies are riskier than single pregnancies. A woman with a multiple pregnancy is more likely to have certain problems during her pregnancy. Therefore, she will need to have more frequent appointments for prenatal care. How does a multiple pregnancy happen? A multiple pregnancy happens when:  The woman's body releases more than one egg at a time, and then each egg gets fertilized by a different sperm. ? This is the most common type of multiple pregnancy. ? Twins or other multiples produced this way are fraternal. They are no more alike than non-multiple siblings are.  One sperm fertilizes one egg, which then divides into more than one embryo. ? Twins or other multiples produced this way are identical. Identical multiples are always the same gender, and they look very much alike.  Who is most likely to have a multiple pregnancy? A multiple pregnancy is more likely to develop in women who:  Have had fertility treatment, especially if the treatment included fertility drugs.  Are older than 28 years of age.  Have already had four or more children.  Have a family history of multiple pregnancy.  How is a multiple pregnancy diagnosed? A multiple pregnancy may be diagnosed based on:  Symptoms such as: ? Rapid weight gain in the first 3 months of pregnancy (first trimester). ? More severe nausea and breast tenderness than what is typical of a single pregnancy. ? The uterus measuring larger than what is normal for the stage of the pregnancy.  Blood tests that detect a higher-than-normal level of human chorionic gonadotropin (hCG). This is a hormone that your body produces in early pregnancy.  Ultrasound  exam. This is used to confirm that you are carrying multiples.  What risks are associated with multiple pregnancy? A multiple pregnancy puts you at a higher risk for certain problems during or after your pregnancy, including:  Having your babies delivered before you have reached a full-term pregnancy (preterm birth). A full-term pregnancy lasts for at least 37 weeks. Babies born before 53 weeks may have a higher risk of a variety of health problems, such as breathing problems, feeding difficulties, cerebral palsy, and learning disabilities.  Diabetes.  Preeclampsia. This is a serious condition that causes high blood pressure along with other symptoms, such as swelling and headaches, during pregnancy.  Excessive blood loss after childbirth (postpartum hemorrhage).  Postpartum depression.  Low birth weight of the babies.  How will having a multiple pregnancy affect my care? Your health care provider will want to monitor you more closely during your pregnancy to make sure that your babies are growing normally and that you are healthy. Follow these instructions at home: Because your pregnancy is considered to be high risk, you will need to work closely with your health care team. You may also need to make some lifestyle changes. These may include the following: Eating and drinking  Increase your nutrition. ? Follow your health care provider's recommendations for weight gain. You may need to gain a little extra weight when you are pregnant with multiples. ? Eat healthy snacks often throughout the day. This can add calories and reduce nausea.  Drink enough fluid to keep your urine clear or pale yellow.  Take prenatal vitamins. Activity By 20-24 weeks, you may  need to limit your activities.  Avoid activities and work that take a lot of effort (are strenuous).  Ask your health care provider when you should stop having sexual intercourse.  Rest often.  General instructions  Do not use  any products that contain nicotine or tobacco, such as cigarettes and e-cigarettes. If you need help quitting, ask your health care provider.  Do not drink alcohol or use illegal drugs.  Take over-the-counter and prescription medicines only as told by your health care provider.  Arrange for extra help around the house.  Keep all follow-up visits and all prenatal visits as told by your health care provider. This is important. Contact a health care provider if:  You have dizziness.  You have persistent nausea, vomiting, or diarrhea.  You are having trouble gaining weight.  You have feelings of depression or other emotions that are interfering with your normal activities. Get help right away if:  You have a fever.  You have pain with urination.  You have fluid leaking from your vagina.  You have a bad-smelling vaginal discharge.  You notice increased swelling in your face, hands, legs, or ankles.  You have spotting or bleeding from your vagina.  You have pelvic cramps, pelvic pressure, or nagging pain in your abdomen or lower back.  You are having regular contractions.  You develop a severe headache, with or without visual changes.  You have shortness of breath or chest pain.  You notice less fetal movement, or no fetal movement. This information is not intended to replace advice given to you by your health care provider. Make sure you discuss any questions you have with your health care provider. Document Released: 01/03/2008 Document Revised: 11/25/2015 Document Reviewed: 11/25/2015 Elsevier Interactive Patient Education  2018 Elsevier Inc.  

## 2017-04-10 NOTE — Progress Notes (Signed)
DATING AND VIABILITY SONOGRAM   Valerie RoysShatona U Barnhard is a 27 y.o. year old 204P3003 with LMP Patient's last menstrual period was 12/29/2016. which would correlate to  8974w4d weeks gestation.  She has irregular menstrual cycles.   She is here today for a confirmatory initial sonogram.    GESTATION: Twins     FETAL ACTIVITY:          Heart rate: Baby A: 154bpm, Baby B: 156bpm          The fetus is active.   GESTATIONAL AGE AND  BIOMETRICS:  Gestational criteria: Estimated Date of Delivery: 10/05/17 by LMP now at 6874w4d   Baby A HC = 9.69cm         14.3 weeks  Baby B HC = 9.23cm 14.1 weeks                                                   AVERAGE EGA(BY THIS SCAN):  14.2 weeks  WORKING EDD( LMP ):  10/05/17     TECHNICIAN COMMENTS: Twin gestation and determine number of sacs/placentas. Will send to MFM for confirmation   A copy of this report including all images has been saved and backed up to a second source for retrieval if needed. All measures and details of the anatomical scan, placentation, fluid volume and pelvic anatomy are contained in that report.  Keywon Mestre 04/10/2017 2:00 PM

## 2017-04-11 LAB — PROTEIN / CREATININE RATIO, URINE
Creatinine, Urine: 93.1 mg/dL
Protein, Ur: 11.9 mg/dL
Protein/Creat Ratio: 128 mg/g creat (ref 0–200)

## 2017-04-11 NOTE — Progress Notes (Addendum)
  Subjective:    Traci [name] is being seen today for her first obstetrical visit.  This is not a planned pregnancy. She is at 4345w5d gestation. Her obstetrical history is significant for twin gestation, history of pre-eclampsia and GDM in prior pregnancy. . Relationship with FOB: significant other, living together. Patient does intend to breast feed. Pregnancy history fully reviewed.  Patient reports no complaints.  Review of Systems:   Review of Systems  Constitutional: Negative.   HENT: Negative.   Respiratory: Negative.   Gastrointestinal: Negative.   Genitourinary: Negative.   Neurological: Negative.     Objective:     BP 115/75   Pulse (!) 118   Wt 164 lb 3.2 oz (74.5 kg)   LMP 12/29/2016   BMI 29.09 kg/m  Physical Exam  Constitutional: She appears well-developed.  HENT:  Head: Normocephalic.  Neck: Normal range of motion.  Cardiovascular: Normal rate.  Respiratory: Effort normal.  GI: Soft.  Musculoskeletal: Normal range of motion.  Neurological: She is alert.  Skin: Skin is warm and dry.    Exam    Assessment:    Pregnancy: [id number] Patient Active Problem List   Diagnosis Date Noted  . Twin gestation in second trimester 04/10/2017  . Ventral hernia 03/25/2017  . Supervision of other normal pregnancy, antepartum 03/05/2017  . History of gestational diabetes mellitus 10/21/2013  . History of pre-eclampsia 10/21/2013       Plan:     Initial labs drawn. Prenatal vitamins. Problem list reviewed and updated. AFP3 discussed: discuss at next visit. . Role of ultrasound in pregnancy discussed; fetal survey: ordered. MFM US ordered to assess number of placenta and amniotic sacs. Amniocentesis discussed: not indicated. Follow up in 4 weeks. 80% of 30  min visit spent on counseling and coordination of care.  -Patient excited about twins -Draw UPC today; had rest of prenatal labs done in early December.  -Patient cannot do early 1 hour; will do at next  visit.  -Start on baby ASA Traci [name] 04/11/2017

## 2017-04-12 LAB — URINE CULTURE

## 2017-04-13 ENCOUNTER — Encounter: Payer: Self-pay | Admitting: Student

## 2017-04-13 ENCOUNTER — Other Ambulatory Visit: Payer: Self-pay | Admitting: Student

## 2017-04-13 ENCOUNTER — Telehealth: Payer: Self-pay | Admitting: Student

## 2017-04-13 DIAGNOSIS — R8271 Bacteriuria: Secondary | ICD-10-CM | POA: Insufficient documentation

## 2017-04-13 MED ORDER — AMOXICILLIN 500 MG PO CAPS: 500.0000 mg | ORAL_CAPSULE | Freq: Three times a day (TID) | ORAL | 0 refills | Status: DC

## 2017-04-13 MED ORDER — PRENATAL ADULT GUMMY/DHA/FA 0.4-25 MG PO CHEW: 1.0000 | CHEWABLE_TABLET | Freq: Every day | ORAL | 5 refills | Status: DC

## 2017-04-13 NOTE — Telephone Encounter (Signed)
Called patient and spoke to her about her GBS bacteriuruia. Informed her of the risks of untreated GBS. Patient will pick up RX for Amoxicillin. Patient has not started her baby ASA, but she plans to pick up. She also requests gummy prenatal vitamins.

## 2017-04-17 ENCOUNTER — Encounter: Payer: Self-pay | Admitting: Radiology

## 2017-04-22 ENCOUNTER — Encounter: Payer: Self-pay | Admitting: Obstetrics and Gynecology

## 2017-04-22 ENCOUNTER — Encounter: Payer: Self-pay | Admitting: *Deleted

## 2017-04-22 DIAGNOSIS — D563 Thalassemia minor: Secondary | ICD-10-CM | POA: Insufficient documentation

## 2017-04-22 HISTORY — DX: Thalassemia minor: D56.3

## 2017-04-25 ENCOUNTER — Encounter (HOSPITAL_COMMUNITY): Payer: Self-pay | Admitting: Student

## 2017-04-30 ENCOUNTER — Other Ambulatory Visit: Payer: Self-pay

## 2017-04-30 MED ORDER — ONDANSETRON HCL 4 MG PO TABS: 4.0000 mg | ORAL_TABLET | Freq: Three times a day (TID) | ORAL | 0 refills | Status: DC | PRN

## 2017-04-30 NOTE — Telephone Encounter (Signed)
Patient would like to have something call in for nausea. [facility]

## 2017-05-03 ENCOUNTER — Ambulatory Visit (HOSPITAL_COMMUNITY)
Admission: RE | Admit: 2017-05-03 | Discharge: 2017-05-03 | Disposition: A | Discharge status: Home / Self Care | Payer: Medicaid Other | Source: Ambulatory Visit | Attending: Student | Admitting: Student

## 2017-05-03 ENCOUNTER — Telehealth: Payer: Self-pay | Admitting: *Deleted

## 2017-05-03 ENCOUNTER — Inpatient Hospital Stay (HOSPITAL_COMMUNITY)
Admission: AD | Admit: 2017-05-03 | Discharge: 2017-05-03 | Disposition: A | Discharge status: Home / Self Care | Payer: Medicaid Other | Source: Ambulatory Visit | Attending: Obstetrics and Gynecology | Admitting: Obstetrics and Gynecology

## 2017-05-03 ENCOUNTER — Encounter (HOSPITAL_COMMUNITY): Payer: Self-pay

## 2017-05-03 ENCOUNTER — Other Ambulatory Visit (HOSPITAL_COMMUNITY): Payer: Self-pay | Admitting: *Deleted

## 2017-05-03 ENCOUNTER — Other Ambulatory Visit: Payer: Self-pay

## 2017-05-03 ENCOUNTER — Encounter (HOSPITAL_COMMUNITY): Payer: Self-pay | Admitting: *Deleted

## 2017-05-03 ENCOUNTER — Other Ambulatory Visit: Payer: Self-pay | Admitting: Student

## 2017-05-03 DIAGNOSIS — Z3A17 17 weeks gestation of pregnancy: Secondary | ICD-10-CM | POA: Insufficient documentation

## 2017-05-03 DIAGNOSIS — O09299 Supervision of pregnancy with other poor reproductive or obstetric history, unspecified trimester: Secondary | ICD-10-CM

## 2017-05-03 DIAGNOSIS — O26892 Other specified pregnancy related conditions, second trimester: Secondary | ICD-10-CM | POA: Insufficient documentation

## 2017-05-03 DIAGNOSIS — R1030 Lower abdominal pain, unspecified: Secondary | ICD-10-CM | POA: Diagnosis present

## 2017-05-03 DIAGNOSIS — O30002 Twin pregnancy, unspecified number of placenta and unspecified number of amniotic sacs, second trimester: Secondary | ICD-10-CM | POA: Insufficient documentation

## 2017-05-03 DIAGNOSIS — O9989 Other specified diseases and conditions complicating pregnancy, childbirth and the puerperium: Secondary | ICD-10-CM

## 2017-05-03 DIAGNOSIS — D563 Thalassemia minor: Secondary | ICD-10-CM | POA: Diagnosis not present

## 2017-05-03 DIAGNOSIS — Z3480 Encounter for supervision of other normal pregnancy, unspecified trimester: Secondary | ICD-10-CM

## 2017-05-03 DIAGNOSIS — O30039 Twin pregnancy, monochorionic/diamniotic, unspecified trimester: Secondary | ICD-10-CM

## 2017-05-03 DIAGNOSIS — N949 Unspecified condition associated with female genital organs and menstrual cycle: Secondary | ICD-10-CM | POA: Diagnosis not present

## 2017-05-03 DIAGNOSIS — O30032 Twin pregnancy, monochorionic/diamniotic, second trimester: Secondary | ICD-10-CM | POA: Diagnosis not present

## 2017-05-03 DIAGNOSIS — O09292 Supervision of pregnancy with other poor reproductive or obstetric history, second trimester: Secondary | ICD-10-CM | POA: Diagnosis not present

## 2017-05-03 DIAGNOSIS — Z3689 Encounter for other specified antenatal screening: Secondary | ICD-10-CM | POA: Diagnosis present

## 2017-05-03 DIAGNOSIS — O0992 Supervision of high risk pregnancy, unspecified, second trimester: Secondary | ICD-10-CM

## 2017-05-03 LAB — WET PREP, GENITAL
CLUE CELLS WET PREP: NONE SEEN
Sperm: NONE SEEN
TRICH WET PREP: NONE SEEN
YEAST WET PREP: NONE SEEN

## 2017-05-03 LAB — URINALYSIS, ROUTINE W REFLEX MICROSCOPIC
BILIRUBIN URINE: NEGATIVE
Glucose, UA: NEGATIVE mg/dL
Hgb urine dipstick: NEGATIVE
KETONES UR: NEGATIVE mg/dL
LEUKOCYTES UA: NEGATIVE
NITRITE: NEGATIVE
PH: 7 (ref 5.0–8.0)
PROTEIN: NEGATIVE mg/dL
Specific Gravity, Urine: 1.02 (ref 1.005–1.030)

## 2017-05-03 NOTE — Discharge Instructions (Signed)

## 2017-05-03 NOTE — Telephone Encounter (Signed)
Pt called in stating she has increased vaginal pain/pressure. She has appt with MFM at 0930. She states she still feels fetal movement but pain/pressure is concerning. Advised pt to go to MAU for exam before MFM appt. Attending aware.

## 2017-05-03 NOTE — MAU Provider Note (Signed)
History     CSN: [id number]  Arrival date and time: 05/03/17 4098   First Provider Initiated Contact with Patient 05/03/17 [phone]      Chief Complaint  Patient presents with  . Pelvic Pain   Traci [name] is a 27 y.o. G4P3003 at [redacted]w[redacted]d who presents today with lower abdominal pain. She states that the pain started around 0300 this morning, and it comes and goes. She has 0/10 pain at this time, but when it comes it can be between 5/10-10/10.    Pelvic Pain  The patient's primary symptoms include pelvic pain. This is a new problem. The current episode started today. The problem occurs intermittently. The problem has been unchanged. The patient is experiencing no pain. The problem affects both sides. She is pregnant. Pertinent negatives include no chills, dysuria, fever, frequency, nausea, urgency or vomiting. The vaginal discharge was normal. There has been no bleeding. Nothing aggravates the symptoms. She has tried nothing for the symptoms. Menstrual history: LMP 12/29/16    Past Medical History:  Diagnosis Date  . Gestational diabetes   . Infection    UTI  . Preeclampsia   . Pregnancy induced hypertension   . Ventral hernia without obstruction or gangrene    reduced in the ER 2016    Past Surgical History:  Procedure Laterality Date  . NO PAST SURGERIES    . tooth extaction      Family History  Problem Relation Age of Onset  . Diabetes Maternal Grandmother     Social History   Tobacco Use  . Smoking status: Never Smoker  . Smokeless tobacco: Never Used  Substance Use Topics  . Alcohol use: No    Alcohol/week: 0.0 oz  . Drug use: No    Allergies: No Known Allergies  No medications prior to admission.    Review of Systems  Constitutional: Negative for chills and fever.  Gastrointestinal: Negative for nausea and vomiting.  Genitourinary: Positive for pelvic pain. Negative for dysuria, frequency and urgency.   Physical Exam   Blood pressure 113/72, pulse  79, temperature 99.3 F (37.4 C), temperature source Oral, resp. rate 17, weight 169 lb 8 oz (76.9 kg), last menstrual period 12/29/2016, currently breastfeeding.  Physical Exam  Nursing note and vitals reviewed. Constitutional: She is oriented to person, place, and time. She appears well-developed and well-nourished. No distress.  HENT:  Head: Normocephalic.  Cardiovascular: Normal rate.  Respiratory: Effort normal.  GI: Soft. There is no tenderness. There is no rebound.  Genitourinary:  Genitourinary Comments:  External: no lesion Vagina: small amount of white discharge Cervix: pink, smooth, closed/thick  Uterus: equal to about 25 week size    Neurological: She is alert and oriented to person, place, and time.  Skin: Skin is warm and dry.  Psychiatric: She has a normal mood and affect.  FHT A: 153 with doppler B: 158 with doppler   Results for orders placed or performed during the hospital encounter of 05/03/17 (from the past 24 hour(s))  Urinalysis, Routine w reflex microscopic     Status: None   Collection Time: 05/03/17  8:36 AM  Result Value Ref Range   Color, Urine YELLOW YELLOW   APPearance CLEAR CLEAR   Specific Gravity, Urine 1.020 1.005 - 1.030   pH 7.0 5.0 - 8.0   Glucose, UA NEGATIVE NEGATIVE mg/dL   Hgb urine dipstick NEGATIVE NEGATIVE   Bilirubin Urine NEGATIVE NEGATIVE   Ketones, ur NEGATIVE NEGATIVE mg/dL   Protein, ur NEGATIVE  NEGATIVE mg/dL   Nitrite NEGATIVE NEGATIVE   Leukocytes, UA NEGATIVE NEGATIVE  Wet prep, genital     Status: Abnormal   Collection Time: 05/03/17  9:30 AM  Result Value Ref Range   Yeast Wet Prep HPF POC NONE SEEN NONE SEEN   Trich, Wet Prep NONE SEEN NONE SEEN   Clue Cells Wet Prep HPF POC NONE SEEN NONE SEEN   WBC, Wet Prep HPF POC MODERATE (A) NONE SEEN   Sperm NONE SEEN      MAU Course  Procedures  MDM Patient desires to leave to be able to make her US appt. Results pending. Will call with any + results.   Assessment  and Plan   1. Round ligament pain   2. Alpha thalassemia silent carrier   3. Supervision of other normal pregnancy, antepartum   4. [redacted] weeks gestation of pregnancy   5. Twin gestation in second trimester, unspecified multiple gestation type    DC home Comfort measures reviewed  2nd Trimester precautions  PTL precautions  Fetal kick counts RX: none  Return to MAU as needed FU with OB as planned  Follow-up Information    [facility].   Specialty:  Obstetrics and Gynecology Contact information: [address] [place] [id number] [phone]           Thressa ShellerHeather Alannah Averhart 05/03/2017, 11:04 AM

## 2017-05-03 NOTE — MAU Note (Signed)
Having sharp pain and pressure in lower abd, was a "10" last night. No bleeding.

## 2017-05-06 LAB — GC/CHLAMYDIA PROBE AMP (~~LOC~~) NOT AT ARMC
Chlamydia: NEGATIVE
Neisseria Gonorrhea: NEGATIVE

## 2017-05-08 ENCOUNTER — Ambulatory Visit (INDEPENDENT_AMBULATORY_CARE_PROVIDER_SITE_OTHER): Payer: Medicaid Other | Admitting: Obstetrics and Gynecology

## 2017-05-08 ENCOUNTER — Encounter: Payer: Self-pay | Admitting: Obstetrics and Gynecology

## 2017-05-08 VITALS — BP 118/80 | HR 108 | Wt 164.0 lb

## 2017-05-08 DIAGNOSIS — O30032 Twin pregnancy, monochorionic/diamniotic, second trimester: Secondary | ICD-10-CM

## 2017-05-08 DIAGNOSIS — O219 Vomiting of pregnancy, unspecified: Secondary | ICD-10-CM

## 2017-05-08 DIAGNOSIS — O0992 Supervision of high risk pregnancy, unspecified, second trimester: Secondary | ICD-10-CM

## 2017-05-08 DIAGNOSIS — K439 Ventral hernia without obstruction or gangrene: Secondary | ICD-10-CM

## 2017-05-08 DIAGNOSIS — Z8759 Personal history of other complications of pregnancy, childbirth and the puerperium: Secondary | ICD-10-CM

## 2017-05-08 DIAGNOSIS — R8271 Bacteriuria: Secondary | ICD-10-CM

## 2017-05-08 MED ORDER — RANITIDINE HCL 150 MG PO TABS: 150.0000 mg | ORAL_TABLET | Freq: Two times a day (BID) | ORAL | 3 refills | Status: DC

## 2017-05-08 MED ORDER — FOLIC ACID 1 MG PO TABS: 1.0000 mg | ORAL_TABLET | Freq: Every day | ORAL | 3 refills | Status: DC

## 2017-05-08 MED ORDER — DOCUSATE SODIUM 100 MG PO CAPS: 100.0000 mg | ORAL_CAPSULE | Freq: Two times a day (BID) | ORAL | 3 refills | Status: DC | PRN

## 2017-05-08 MED ORDER — SCOPOLAMINE 1 MG/3DAYS TD PT72: 1.0000 | MEDICATED_PATCH | TRANSDERMAL | 1 refills | Status: DC

## 2017-05-08 MED ORDER — PROMETHAZINE HCL 25 MG PO TABS: 25.0000 mg | ORAL_TABLET | Freq: Four times a day (QID) | ORAL | 3 refills | Status: DC | PRN

## 2017-05-08 NOTE — Progress Notes (Signed)
Prenatal Visit Note Date: 05/09/2017 Clinic: [facility]  Subjective:  Traci PrinceSHA'TONA U Gibeau is a 27 y.o. [phone] at [redacted]w[redacted]d being seen today for ongoing prenatal care.  She is currently monitored for the following issues for this high-risk pregnancy and has History of gestational diabetes mellitus; History of pre-eclampsia; Supervision of high risk pregnancy, antepartum, second trimester; Ventral hernia; Monochorionic diamniotic twin gestation; GBS bacteriuria; and Alpha thalassemia silent carrier on their problem list.  Patient reports continued n/v of pregnancy.   Contractions: Not present. Vag. Bleeding: None.  Movement: Present. Denies leaking of fluid.   The following portions of the patient's history were reviewed and updated as appropriate: allergies, current medications, past family history, past medical history, past social history, past surgical history and problem list. Problem list updated.  Objective:   Vitals:   05/08/17 1009  BP: 118/80  Pulse: (!) 108  Weight: 164 lb (74.4 kg)    Fetal Status: Fetal Heart Rate (bpm): 150/154   Movement: Present     General:  Alert, oriented and cooperative. Patient is in no acute distress.  Skin: Skin is warm and dry. No rash noted.   Cardiovascular: Normal heart rate noted  Respiratory: Normal respiratory effort, no problems with respiration noted  Abdomen: Soft, gravid, appropriate for gestational age. Pain/Pressure: Absent    approx 5x5cm a few cm right of the umbilicus. Nttp, easily reducible hernia  Pelvic:  Cervical exam deferred        Extremities: Normal range of motion.  Edema: None  Mental Status: Normal mood and affect. Normal behavior. Normal judgment and thought content.   Urinalysis:      Assessment and Plan:  Pregnancy: G4P3003 at [redacted]w[redacted]d  1. Monochorionic diamniotic twin gestation in second trimester Already set up for serial u/s for growth and TTTS.   2. Supervision of high risk pregnancy,  antepartum, second trimester Pt amenable to afp.   3. GBS bacteriuria toc today - Culture, OB Urine  4. Nausea and vomiting in pregnancy prior to [redacted] weeks gestation Check electrolytes. Pt only using regular zofran and she does have gerd. Bid zantac and scope patch for a 3-6d to get meds on board and pt amenable to doing po phenergan. - CMP and Liver - Magnesium  5. Ventral hernia without obstruction or gangrene ER precautions given and pt told to try binder as that may help prevent any future issues; currently, pt is asymptomatic.   Preterm labor symptoms and general obstetric precautions including but not limited to vaginal bleeding, contractions, leaking of fluid and fetal movement were reviewed in detail with the patient. Please refer to After Visit Summary for other counseling recommendations.  Return in about 2 weeks (around 05/22/2017) for hrob.   [place] BingPickens, Mavis Fichera, MD

## 2017-05-09 LAB — CMP AND LIVER
ALBUMIN: 3.5 g/dL (ref 3.5–5.5)
ALT: 22 IU/L (ref 0–32)
AST: 20 IU/L (ref 0–40)
Alkaline Phosphatase: 57 IU/L (ref 39–117)
BILIRUBIN, DIRECT: 0.06 mg/dL (ref 0.00–0.40)
BUN: 5 mg/dL — ABNORMAL LOW (ref 6–20)
CALCIUM: 8.7 mg/dL (ref 8.7–10.2)
CHLORIDE: 101 mmol/L (ref 96–106)
CO2: 21 mmol/L (ref 20–29)
Creatinine, Ser: 0.57 mg/dL (ref 0.57–1.00)
GFR, EST AFRICAN AMERICAN: 147 mL/min/{1.73_m2} (ref >59)
GFR, EST NON AFRICAN AMERICAN: 127 mL/min/{1.73_m2} (ref >59)
GLUCOSE: 92 mg/dL (ref 65–99)
POTASSIUM: 3.7 mmol/L (ref 3.5–5.2)
SODIUM: 137 mmol/L (ref 134–144)
TOTAL PROTEIN: 6.3 g/dL (ref 6.0–8.5)

## 2017-05-09 LAB — MAGNESIUM: Magnesium: 1.7 mg/dL (ref 1.6–2.3)

## 2017-05-10 LAB — AFP, SERUM, OPEN SPINA BIFIDA
AFP MOM: 0.93
AFP VALUE AFPOSL: 57.3 ng/mL
Gest. Age on Collection Date: 18.4 weeks
MATERNAL AGE AT EDD: 27.5 a
OSBR RISK 1 IN: 10000
TEST RESULTS AFP: NEGATIVE
Weight: 108 [lb_av]

## 2017-05-10 LAB — CULTURE, OB URINE

## 2017-05-10 LAB — URINE CULTURE, OB REFLEX

## 2017-05-17 ENCOUNTER — Ambulatory Visit (HOSPITAL_COMMUNITY)
Admission: RE | Admit: 2017-05-17 | Discharge: 2017-05-17 | Disposition: A | Discharge status: Home / Self Care | Payer: Medicaid Other | Source: Ambulatory Visit | Attending: Student | Admitting: Student

## 2017-05-17 ENCOUNTER — Encounter (HOSPITAL_COMMUNITY): Payer: Self-pay

## 2017-05-17 DIAGNOSIS — O09299 Supervision of pregnancy with other poor reproductive or obstetric history, unspecified trimester: Secondary | ICD-10-CM | POA: Insufficient documentation

## 2017-05-17 DIAGNOSIS — Z3A19 19 weeks gestation of pregnancy: Secondary | ICD-10-CM | POA: Diagnosis not present

## 2017-05-17 DIAGNOSIS — O30039 Twin pregnancy, monochorionic/diamniotic, unspecified trimester: Secondary | ICD-10-CM

## 2017-05-17 DIAGNOSIS — O30032 Twin pregnancy, monochorionic/diamniotic, second trimester: Secondary | ICD-10-CM | POA: Diagnosis not present

## 2017-05-21 ENCOUNTER — Encounter: Payer: Medicaid Other | Admitting: Family Medicine

## 2017-05-27 ENCOUNTER — Encounter (INDEPENDENT_AMBULATORY_CARE_PROVIDER_SITE_OTHER): Payer: Medicaid Other | Admitting: *Deleted

## 2017-05-27 ENCOUNTER — Ambulatory Visit (INDEPENDENT_AMBULATORY_CARE_PROVIDER_SITE_OTHER): Payer: Medicaid Other | Admitting: Family Medicine

## 2017-05-27 ENCOUNTER — Encounter: Payer: Self-pay | Admitting: Family Medicine

## 2017-05-27 VITALS — BP 134/72 | HR 80 | Wt 179.0 lb

## 2017-05-27 DIAGNOSIS — Z3492 Encounter for supervision of normal pregnancy, unspecified, second trimester: Secondary | ICD-10-CM | POA: Diagnosis not present

## 2017-05-27 DIAGNOSIS — O219 Vomiting of pregnancy, unspecified: Secondary | ICD-10-CM

## 2017-05-27 DIAGNOSIS — O0992 Supervision of high risk pregnancy, unspecified, second trimester: Secondary | ICD-10-CM

## 2017-05-27 DIAGNOSIS — R8271 Bacteriuria: Secondary | ICD-10-CM

## 2017-05-27 DIAGNOSIS — O30032 Twin pregnancy, monochorionic/diamniotic, second trimester: Secondary | ICD-10-CM

## 2017-05-27 NOTE — Progress Notes (Signed)
   PRENATAL VISIT NOTE  Subjective:  Traci [name] is a 27 y.o. G4P3003 at 5839w2d being seen today for ongoing prenatal care.  She is currently monitored for the following issues for this high-risk pregnancy and has History of gestational diabetes mellitus; History of pre-eclampsia; Supervision of high risk pregnancy, antepartum, second trimester; Ventral hernia; Monochorionic diamniotic twin gestation; GBS bacteriuria; and Alpha thalassemia silent carrier on their problem list.  Patient reports no complaints.  Contractions: Not present. Vag. Bleeding: None.  Movement: Present. Denies leaking of fluid.   The following portions of the patient's history were reviewed and updated as appropriate: allergies, current medications, past family history, past medical history, past social history, past surgical history and problem list. Problem list updated.  Objective:   Vitals:   05/27/17 1438  BP: 134/72  Pulse: 80  Weight: 179 lb (81.2 kg)    Fetal Status: Fetal Heart Rate (bpm): 148/156   Movement: Present     General:  Alert, oriented and cooperative. Patient is in no acute distress.  Skin: Skin is warm and dry. No rash noted.   Cardiovascular: Normal heart rate noted  Respiratory: Normal respiratory effort, no problems with respiration noted  Abdomen: Soft, gravid, appropriate for gestational age.  Pain/Pressure: Absent     Pelvic: Cervical exam deferred        Extremities: Normal range of motion.  Edema: None  Mental Status:  Normal mood and affect. Normal behavior. Normal judgment and thought content.   Assessment and Plan:  Pregnancy: G4P3003 at 4639w2d  1. Supervision of high risk pregnancy, antepartum, second trimester FHT and FH normal  2. Monochorionic diamniotic twin gestation in second trimester Continue q2 wk US to monitor TTTS   3. GBS bacteriuria Intrapartum prophylaxis  4. Nausea and vomiting in pregnancy prior to [redacted] weeks gestation   Preterm labor symptoms and  general obstetric precautions including but not limited to vaginal bleeding, contractions, leaking of fluid and fetal movement were reviewed in detail with the patient. Please refer to After Visit Summary for other counseling recommendations.  Return in about 4 weeks (around 06/24/2017) for OB f/u.   Levie HeritageJacob J Stinson, DO

## 2017-05-31 ENCOUNTER — Other Ambulatory Visit (HOSPITAL_COMMUNITY): Payer: Self-pay | Admitting: Maternal and Fetal Medicine

## 2017-05-31 ENCOUNTER — Encounter (HOSPITAL_COMMUNITY): Payer: Self-pay

## 2017-05-31 ENCOUNTER — Ambulatory Visit (HOSPITAL_COMMUNITY)
Admission: RE | Admit: 2017-05-31 | Discharge: 2017-05-31 | Disposition: A | Discharge status: Home / Self Care | Payer: Medicaid Other | Source: Ambulatory Visit | Attending: Student | Admitting: Student

## 2017-05-31 DIAGNOSIS — O30039 Twin pregnancy, monochorionic/diamniotic, unspecified trimester: Secondary | ICD-10-CM

## 2017-05-31 DIAGNOSIS — O402XX2 Polyhydramnios, second trimester, fetus 2: Secondary | ICD-10-CM | POA: Insufficient documentation

## 2017-05-31 DIAGNOSIS — Z3A21 21 weeks gestation of pregnancy: Secondary | ICD-10-CM | POA: Diagnosis not present

## 2017-05-31 DIAGNOSIS — Z8632 Personal history of gestational diabetes: Secondary | ICD-10-CM

## 2017-05-31 DIAGNOSIS — O09299 Supervision of pregnancy with other poor reproductive or obstetric history, unspecified trimester: Secondary | ICD-10-CM

## 2017-05-31 DIAGNOSIS — O30032 Twin pregnancy, monochorionic/diamniotic, second trimester: Secondary | ICD-10-CM | POA: Diagnosis not present

## 2017-05-31 DIAGNOSIS — Z3A31 31 weeks gestation of pregnancy: Secondary | ICD-10-CM

## 2017-06-07 ENCOUNTER — Other Ambulatory Visit (HOSPITAL_COMMUNITY)
Admission: RE | Admit: 2017-06-07 | Discharge: 2017-06-07 | Disposition: A | Discharge status: Home / Self Care | Payer: Medicaid Other | Source: Ambulatory Visit | Attending: Obstetrics & Gynecology | Admitting: Obstetrics & Gynecology

## 2017-06-07 ENCOUNTER — Other Ambulatory Visit: Payer: Medicaid Other

## 2017-06-07 DIAGNOSIS — B373 Candidiasis of vulva and vagina: Secondary | ICD-10-CM

## 2017-06-07 DIAGNOSIS — N898 Other specified noninflammatory disorders of vagina: Secondary | ICD-10-CM | POA: Insufficient documentation

## 2017-06-07 DIAGNOSIS — O98819 Other maternal infectious and parasitic diseases complicating pregnancy, unspecified trimester: Secondary | ICD-10-CM

## 2017-06-07 NOTE — Progress Notes (Signed)
SUBJECTIVE:  27 y.o. female complains of pe vag discharge desc:[id number]} vaginal discharge and itching for a couple day(s). Denies abnormal vaginal bleeding or significant pelvic pain or fever. No UTI symptoms. Denies history of known exposure to STD.  Patient's last menstrual period was 12/29/2016.  OBJECTIVE:  She appears well, afebrile. Urine dipstick: not done.  ASSESSMENT:  Vaginal Discharge  Vaginal Odor  PLAN:  GC, chlamydia, trichomonas, BVAG, CVAG probe sent to lab. Treatment: To be determined once lab results are received ROV prn if symptoms persist or worsen.

## 2017-06-10 LAB — CERVICOVAGINAL ANCILLARY ONLY
BACTERIAL VAGINITIS: NEGATIVE
Candida vaginitis: POSITIVE — AB
Chlamydia: NEGATIVE
Neisseria Gonorrhea: NEGATIVE
Trichomonas: NEGATIVE

## 2017-06-10 MED ORDER — TERCONAZOLE 0.4 % VA CREA: 1.0000 | TOPICAL_CREAM | Freq: Every day | VAGINAL | 0 refills | Status: DC

## 2017-06-14 ENCOUNTER — Ambulatory Visit (HOSPITAL_COMMUNITY): Admission: RE | Admit: 2017-06-14 | Payer: Medicaid Other | Source: Ambulatory Visit

## 2017-06-18 ENCOUNTER — Other Ambulatory Visit (HOSPITAL_COMMUNITY): Payer: Self-pay | Admitting: Maternal and Fetal Medicine

## 2017-06-18 ENCOUNTER — Encounter (HOSPITAL_COMMUNITY): Payer: Self-pay

## 2017-06-18 ENCOUNTER — Ambulatory Visit (HOSPITAL_COMMUNITY)
Admission: RE | Admit: 2017-06-18 | Discharge: 2017-06-18 | Disposition: A | Discharge status: Home / Self Care | Payer: Medicaid Other | Source: Ambulatory Visit | Attending: Student | Admitting: Student

## 2017-06-18 DIAGNOSIS — O09292 Supervision of pregnancy with other poor reproductive or obstetric history, second trimester: Secondary | ICD-10-CM | POA: Insufficient documentation

## 2017-06-18 DIAGNOSIS — Z362 Encounter for other antenatal screening follow-up: Secondary | ICD-10-CM | POA: Diagnosis not present

## 2017-06-18 DIAGNOSIS — Z3A24 24 weeks gestation of pregnancy: Secondary | ICD-10-CM | POA: Diagnosis not present

## 2017-06-18 DIAGNOSIS — O402XX0 Polyhydramnios, second trimester, not applicable or unspecified: Secondary | ICD-10-CM | POA: Insufficient documentation

## 2017-06-18 DIAGNOSIS — O30039 Twin pregnancy, monochorionic/diamniotic, unspecified trimester: Secondary | ICD-10-CM

## 2017-06-18 DIAGNOSIS — D563 Thalassemia minor: Secondary | ICD-10-CM

## 2017-06-18 DIAGNOSIS — O30032 Twin pregnancy, monochorionic/diamniotic, second trimester: Secondary | ICD-10-CM | POA: Insufficient documentation

## 2017-06-18 DIAGNOSIS — O402XX2 Polyhydramnios, second trimester, fetus 2: Secondary | ICD-10-CM

## 2017-06-18 DIAGNOSIS — O0992 Supervision of high risk pregnancy, unspecified, second trimester: Secondary | ICD-10-CM

## 2017-06-24 ENCOUNTER — Encounter: Payer: Self-pay | Admitting: *Deleted

## 2017-06-24 ENCOUNTER — Ambulatory Visit (INDEPENDENT_AMBULATORY_CARE_PROVIDER_SITE_OTHER): Payer: Medicaid Other | Admitting: Obstetrics and Gynecology

## 2017-06-24 VITALS — BP 130/79 | HR 76 | Wt 156.0 lb

## 2017-06-24 DIAGNOSIS — R8271 Bacteriuria: Secondary | ICD-10-CM

## 2017-06-24 DIAGNOSIS — O30032 Twin pregnancy, monochorionic/diamniotic, second trimester: Secondary | ICD-10-CM

## 2017-06-24 DIAGNOSIS — K439 Ventral hernia without obstruction or gangrene: Secondary | ICD-10-CM

## 2017-06-24 DIAGNOSIS — O0992 Supervision of high risk pregnancy, unspecified, second trimester: Secondary | ICD-10-CM

## 2017-06-24 DIAGNOSIS — D563 Thalassemia minor: Secondary | ICD-10-CM

## 2017-06-24 DIAGNOSIS — Z8759 Personal history of other complications of pregnancy, childbirth and the puerperium: Secondary | ICD-10-CM

## 2017-06-24 DIAGNOSIS — K5900 Constipation, unspecified: Secondary | ICD-10-CM

## 2017-06-24 DIAGNOSIS — Z8632 Personal history of gestational diabetes: Secondary | ICD-10-CM

## 2017-06-24 MED ORDER — POLYETHYLENE GLYCOL 3350 17 G PO PACK: 17.0000 g | PACK | Freq: Every day | ORAL | 2 refills | Status: DC

## 2017-06-24 NOTE — Progress Notes (Signed)
Prenatal Visit Note Date: 06/24/2017 Clinic[facility]  Subjective:  Valerie RoysSHATONA U Motyka is a 27 y.o. [phone] at 6374w2d being seen today for ongoing prenatal care.  She is currently monitored for the following issues for this high-risk pregnancy and has History of gestational diabetes mellitus; History of pre-eclampsia; Supervision of high risk pregnancy, antepartum, second trimester; Ventral hernia; Monochorionic diamniotic twin gestation; GBS bacteriuria; and Alpha thalassemia silent carrier on their problem list.  Patient reports no complaints.   Contractions: Not present. Vag. Bleeding: None.  Movement: Present. Denies leaking of fluid.   The following portions of the patient's history were reviewed and updated as appropriate: allergies, current medications, past family history, past medical history, past social history, past surgical history and problem list. Problem list updated.  Objective:   Vitals:   06/24/17 1443  BP: 130/79  Pulse: 76  Weight: 156 lb (70.8 kg)    Fetal Status: Fetal Heart Rate (bpm): 140s/150s   Movement: Present     General:  Alert, oriented and cooperative. Patient is in no acute distress.  Skin: Skin is warm and dry. No rash noted.   Cardiovascular: Normal heart rate noted  Respiratory: Normal respiratory effort, no problems with respiration noted  Abdomen: Soft, gravid, appropriate for gestational age. Pain/Pressure: Absent     Pelvic:  Cervical exam deferred        Extremities: Normal range of motion.  Edema: None  Mental Status: Normal mood and affect. Normal behavior. Normal judgment and thought content.   Urinalysis:      Assessment and Plan:  Pregnancy: G4P3003 at 7574w2d  1. Supervision of high risk pregnancy, antepartum, second trimester Routine care. Pt thinking about btl. Papers signed today  2. Ventral hernia without obstruction or gangrene approx 4 x 4 cm and about 4cm above the umbilicus. Pt was supposed to  [facility] [place] [facility] but she states she got scared and didn't follow through, but in reading their note, they did recommend repair with mesh. I told her that I recommend referral back to [facility] at some point in her pregnancy. At her next, if she is desiring BTL, I told her to let us know so we can set her up to see them now to see if they would consider doing the hernia repair at the time of a postpartum BTL. If she doesn't want a BTL or wants an interval BTL, she can be referred to see them at some point later in the pregnancy so they can arrange appropriate PP follow up with them.   3. Monochorionic diamniotic twin gestation in second trimester Pt states she went to [facility] and they didn't believe she has TTTS and just recommend continue with AP surveillance here with MFM. I couldn't find anything in care everywhere or scanned into her chart so she signed an ROI to get that consult. Has repeat u/s later this week with MFM  4. Constipation, unspecified constipation type miralax  5. GBS bacteriuria toc neg   Preterm labor symptoms and general obstetric precautions including but not limited to vaginal bleeding, contractions, leaking of fluid and fetal movement were reviewed in detail with the patient. Please refer to After Visit Summary for other counseling recommendations.  Return in about 10 days (around 07/04/2017) for 10d rob and 28wk labs.   [place] BingPickens, Jaivian Battaglini, MD

## 2017-06-26 ENCOUNTER — Encounter: Payer: Self-pay | Admitting: Radiology

## 2017-06-28 ENCOUNTER — Encounter (HOSPITAL_COMMUNITY): Payer: Self-pay

## 2017-06-28 ENCOUNTER — Other Ambulatory Visit (HOSPITAL_COMMUNITY): Payer: Self-pay | Admitting: Maternal and Fetal Medicine

## 2017-06-28 ENCOUNTER — Ambulatory Visit (HOSPITAL_COMMUNITY)
Admission: RE | Admit: 2017-06-28 | Discharge: 2017-06-28 | Disposition: A | Discharge status: Home / Self Care | Payer: Medicaid Other | Source: Ambulatory Visit | Attending: Student | Admitting: Student

## 2017-06-28 DIAGNOSIS — O30032 Twin pregnancy, monochorionic/diamniotic, second trimester: Secondary | ICD-10-CM | POA: Diagnosis present

## 2017-06-28 DIAGNOSIS — O30039 Twin pregnancy, monochorionic/diamniotic, unspecified trimester: Secondary | ICD-10-CM

## 2017-06-28 DIAGNOSIS — Z3A25 25 weeks gestation of pregnancy: Secondary | ICD-10-CM

## 2017-06-28 DIAGNOSIS — Z362 Encounter for other antenatal screening follow-up: Secondary | ICD-10-CM

## 2017-06-28 DIAGNOSIS — O09292 Supervision of pregnancy with other poor reproductive or obstetric history, second trimester: Secondary | ICD-10-CM | POA: Insufficient documentation

## 2017-07-04 ENCOUNTER — Encounter: Payer: Medicaid Other | Admitting: Family Medicine

## 2017-07-04 ENCOUNTER — Telehealth: Payer: Self-pay

## 2017-07-04 NOTE — Telephone Encounter (Signed)
-----   Message from Lindell SparHeather L Bacon, [place] sent at 07/04/2017  8:18 AM EDT ----- Regarding: pt requesting a call Doesn't think that she will be able to keep the glutose drink down after being npo all night and wants to know if the Is something that she can do. Had 28 Wk ROB/GTT for today but rescheduled due to vomiting

## 2017-07-04 NOTE — Telephone Encounter (Signed)
Call patient to discuss the jelly bean challenge since she thinks she will not be able to tolerated the glucose drink. Patient will bring jelly with her to appointment on Tuesday.

## 2017-07-09 ENCOUNTER — Encounter: Payer: Medicaid Other | Admitting: Obstetrics and Gynecology

## 2017-07-09 ENCOUNTER — Encounter: Payer: Self-pay | Admitting: Obstetrics and Gynecology

## 2017-07-09 NOTE — Progress Notes (Signed)
Patient did not keep OB appointment for 07/09/2017.  Traci [name], Jr MD Attending [facility](Faculty Practice)

## 2017-07-11 ENCOUNTER — Encounter: Payer: Medicaid Other | Admitting: Obstetrics and Gynecology

## 2017-07-12 ENCOUNTER — Ambulatory Visit (HOSPITAL_COMMUNITY)
Admission: RE | Admit: 2017-07-12 | Discharge: 2017-07-12 | Disposition: A | Discharge status: Home / Self Care | Payer: Medicaid Other | Source: Ambulatory Visit | Attending: Student | Admitting: Student

## 2017-07-12 ENCOUNTER — Other Ambulatory Visit (HOSPITAL_COMMUNITY): Payer: Self-pay | Admitting: Maternal and Fetal Medicine

## 2017-07-12 ENCOUNTER — Encounter (HOSPITAL_COMMUNITY): Payer: Self-pay

## 2017-07-12 DIAGNOSIS — O09299 Supervision of pregnancy with other poor reproductive or obstetric history, unspecified trimester: Secondary | ICD-10-CM

## 2017-07-12 DIAGNOSIS — O30039 Twin pregnancy, monochorionic/diamniotic, unspecified trimester: Secondary | ICD-10-CM

## 2017-07-12 DIAGNOSIS — Z3A27 27 weeks gestation of pregnancy: Secondary | ICD-10-CM

## 2017-07-12 DIAGNOSIS — O30032 Twin pregnancy, monochorionic/diamniotic, second trimester: Secondary | ICD-10-CM | POA: Insufficient documentation

## 2017-07-12 DIAGNOSIS — O09292 Supervision of pregnancy with other poor reproductive or obstetric history, second trimester: Secondary | ICD-10-CM | POA: Insufficient documentation

## 2017-07-17 ENCOUNTER — Ambulatory Visit (INDEPENDENT_AMBULATORY_CARE_PROVIDER_SITE_OTHER): Payer: Medicaid Other | Admitting: Obstetrics and Gynecology

## 2017-07-17 VITALS — BP 111/78 | HR 94 | Wt 190.5 lb

## 2017-07-17 DIAGNOSIS — R8271 Bacteriuria: Secondary | ICD-10-CM

## 2017-07-17 DIAGNOSIS — Z23 Encounter for immunization: Secondary | ICD-10-CM

## 2017-07-17 DIAGNOSIS — O30033 Twin pregnancy, monochorionic/diamniotic, third trimester: Secondary | ICD-10-CM

## 2017-07-17 DIAGNOSIS — O0992 Supervision of high risk pregnancy, unspecified, second trimester: Secondary | ICD-10-CM

## 2017-07-17 DIAGNOSIS — Z3480 Encounter for supervision of other normal pregnancy, unspecified trimester: Secondary | ICD-10-CM

## 2017-07-17 DIAGNOSIS — O0993 Supervision of high risk pregnancy, unspecified, third trimester: Secondary | ICD-10-CM

## 2017-07-17 DIAGNOSIS — K439 Ventral hernia without obstruction or gangrene: Secondary | ICD-10-CM

## 2017-07-17 MED ORDER — PRENATAL VITAMINS 0.8 MG PO TABS: 1.0000 | ORAL_TABLET | Freq: Every day | ORAL | 12 refills | Status: DC

## 2017-07-17 NOTE — Progress Notes (Signed)
Prenatal Visit Note Date: 07/17/2017 [facility]  Subjective:  Traci RoysSHATONA U [name] is a 27 y.o. [phone] at 5442w4d being seen today for ongoing prenatal care.  She is currently monitored for the following issues for this high-risk pregnancy and has History of gestational diabetes mellitus; History of pre-eclampsia; Supervision of high risk pregnancy, antepartum, second trimester; Ventral hernia; Monochorionic diamniotic twin gestation; GBS bacteriuria; and Alpha thalassemia silent carrier on their problem list.  Patient reports no complaints.   Contractions: Not present. Vag. Bleeding: None.  Movement: Present. Denies leaking of fluid.   The following portions of the patient's history were reviewed and updated as appropriate: allergies, current medications, past family history, past medical history, past social history, past surgical history and problem list. Problem list updated.  Objective:   Vitals:   07/17/17 0832  BP: 111/78  Pulse: 94  Weight: 190 lb 8 oz (86.4 kg)    Fetal Status: Fetal Heart Rate (bpm): 130s/140s   Movement: Present     General:  Alert, oriented and cooperative. Patient is in no acute distress.  Skin: Skin is warm and dry. No rash noted.   Cardiovascular: Normal heart rate noted  Respiratory: Normal respiratory effort, no problems with respiration noted  Abdomen: Soft, gravid, appropriate for gestational age. Pain/Pressure: Absent     Pelvic:  Cervical exam deferred        Extremities: Normal range of motion.  Edema: None  Mental Status: Normal mood and affect. Normal behavior. Normal judgment and thought content.   Urinalysis:      Assessment and Plan:  Pregnancy: G4P3003 at 7142w4d  1. Supervision of other normal pregnancy, antepartum Routine care. She doesn't want a BTL. Papers already signed. Will have her her [facility] surgery again to see if they would be willing to fix her ventral hernia at the same time that we do a  postpartum BTL, if she has a vaginal delivery; I know they wouldn't do one if she needed a c-section b/c it's a clean contaminated procedure. I told her they probably wouldn't but could at least set her up for a 6-12 week postpartum hernia repair when they see her. Will set her up to be seen sometime in the next few weeks - RPR - Glucose Tolerance, 2 Hours w/1 Hour - CBC - HIV antibody  2. Supervision of high risk pregnancy, antepartum, second trimester See above  3. Ventral hernia without obstruction or gangrene See above  4. GBS bacteriuria tx in labor  5. Monochorionic diamniotic twin gestation in third trimester Normal q2wk u/s. Will need to start more ap testing at 32wks. Subjectively normal af on u/s today.   Preterm labor symptoms and general obstetric precautions including but not limited to vaginal bleeding, contractions, leaking of fluid and fetal movement were reviewed in detail with the patient. Please refer to After Visit Summary for other counseling recommendations.  Return in about 2 weeks (around 07/31/2017).    BingPickens, Christeena Krogh, MD

## 2017-07-18 ENCOUNTER — Other Ambulatory Visit: Payer: Self-pay

## 2017-07-18 ENCOUNTER — Encounter: Payer: Self-pay | Admitting: Obstetrics and Gynecology

## 2017-07-18 ENCOUNTER — Telehealth: Payer: Self-pay

## 2017-07-18 DIAGNOSIS — O24410 Gestational diabetes mellitus in pregnancy, diet controlled: Secondary | ICD-10-CM

## 2017-07-18 DIAGNOSIS — O24415 Gestational diabetes mellitus in pregnancy, controlled by oral hypoglycemic drugs: Secondary | ICD-10-CM | POA: Insufficient documentation

## 2017-07-18 LAB — GLUCOSE TOLERANCE, 2 HOURS W/ 1HR
GLUCOSE, 1 HOUR: 188 mg/dL — AB (ref 65–179)
GLUCOSE, 2 HOUR: 137 mg/dL (ref 65–152)
Glucose, Fasting: 99 mg/dL — ABNORMAL HIGH (ref 65–91)

## 2017-07-18 LAB — CBC
Hematocrit: 33.5 % — ABNORMAL LOW (ref 34.0–46.6)
Hemoglobin: 11.1 g/dL (ref 11.1–15.9)
MCH: 28 pg (ref 26.6–33.0)
MCHC: 33.1 g/dL (ref 31.5–35.7)
MCV: 85 fL (ref 79–97)
Platelets: 174 10*3/uL (ref 150–379)
RBC: 3.96 x10E6/uL (ref 3.77–5.28)
RDW: 14.4 % (ref 12.3–15.4)
WBC: 8.9 10*3/uL (ref 3.4–10.8)

## 2017-07-18 LAB — HIV ANTIBODY (ROUTINE TESTING W REFLEX): HIV SCREEN 4TH GENERATION: NONREACTIVE

## 2017-07-18 LAB — RPR: RPR: NONREACTIVE

## 2017-07-18 MED ORDER — GLUCOSE BLOOD VI STRP: ORAL_STRIP | 12 refills | Status: DC

## 2017-07-18 MED ORDER — OMEPRAZOLE 20 MG PO CPDR: 20.0000 mg | DELAYED_RELEASE_CAPSULE | Freq: Every day | ORAL | 2 refills | Status: DC

## 2017-07-18 MED ORDER — ACCU-CHEK AVIVA PLUS W/DEVICE KIT: 1.0000 | PACK | Freq: Once | 0 refills | Status: AC

## 2017-07-18 MED ORDER — ACCU-CHEK FASTCLIX LANCETS MISC: 2 refills | Status: DC

## 2017-07-18 NOTE — Telephone Encounter (Signed)
Patient has GDM and will need to be referred to Diabetes and Nutrition. Testing supplies have been sent to the pharmacy and patient has been made aware.

## 2017-07-18 NOTE — Telephone Encounter (Signed)
-----   Message from Lindell SparHeather L Bacon, [place] sent at 07/17/2017  2:11 PM EDT ----- Regarding: rx increase Would like heartburn med increased

## 2017-07-18 NOTE — Telephone Encounter (Signed)
Called in omeprazole for patient to start on since zantac is not helping with heartburn.

## 2017-07-19 ENCOUNTER — Telehealth: Payer: Self-pay

## 2017-07-19 NOTE — Telephone Encounter (Signed)
-----   Message from Traci Bingharlie Pickens, MD sent at 07/18/2017  8:32 AM EDT ----- She has GDM. Can you set her up with supplies, appts, etc per protocol? thanks

## 2017-07-19 NOTE — Telephone Encounter (Signed)
Inform patient of test result and need to start checking blood sugars at least 4 times a day. Referral place to nutrition and diabetes.

## 2017-07-26 ENCOUNTER — Encounter (HOSPITAL_COMMUNITY): Payer: Self-pay

## 2017-07-26 ENCOUNTER — Ambulatory Visit (HOSPITAL_COMMUNITY)
Admission: RE | Admit: 2017-07-26 | Discharge: 2017-07-26 | Disposition: A | Discharge status: Home / Self Care | Payer: Medicaid Other | Source: Ambulatory Visit | Attending: Student | Admitting: Student

## 2017-07-26 ENCOUNTER — Other Ambulatory Visit (HOSPITAL_COMMUNITY): Payer: Self-pay | Admitting: *Deleted

## 2017-07-26 DIAGNOSIS — O09293 Supervision of pregnancy with other poor reproductive or obstetric history, third trimester: Secondary | ICD-10-CM | POA: Diagnosis not present

## 2017-07-26 DIAGNOSIS — O30033 Twin pregnancy, monochorionic/diamniotic, third trimester: Secondary | ICD-10-CM | POA: Diagnosis present

## 2017-07-26 DIAGNOSIS — O30032 Twin pregnancy, monochorionic/diamniotic, second trimester: Secondary | ICD-10-CM | POA: Diagnosis not present

## 2017-07-26 DIAGNOSIS — Z3A29 29 weeks gestation of pregnancy: Secondary | ICD-10-CM | POA: Insufficient documentation

## 2017-07-26 DIAGNOSIS — O30039 Twin pregnancy, monochorionic/diamniotic, unspecified trimester: Secondary | ICD-10-CM

## 2017-07-31 ENCOUNTER — Ambulatory Visit (INDEPENDENT_AMBULATORY_CARE_PROVIDER_SITE_OTHER): Payer: Medicaid Other | Admitting: Family Medicine

## 2017-07-31 ENCOUNTER — Encounter: Payer: Self-pay | Admitting: *Deleted

## 2017-07-31 VITALS — BP 131/82 | HR 101 | Wt 191.0 lb

## 2017-07-31 DIAGNOSIS — O0992 Supervision of high risk pregnancy, unspecified, second trimester: Secondary | ICD-10-CM

## 2017-07-31 DIAGNOSIS — O24410 Gestational diabetes mellitus in pregnancy, diet controlled: Secondary | ICD-10-CM

## 2017-07-31 DIAGNOSIS — O30033 Twin pregnancy, monochorionic/diamniotic, third trimester: Secondary | ICD-10-CM

## 2017-07-31 DIAGNOSIS — O0993 Supervision of high risk pregnancy, unspecified, third trimester: Secondary | ICD-10-CM

## 2017-07-31 DIAGNOSIS — Z8759 Personal history of other complications of pregnancy, childbirth and the puerperium: Secondary | ICD-10-CM

## 2017-07-31 MED ORDER — METFORMIN HCL 500 MG PO TABS: 500.0000 mg | ORAL_TABLET | Freq: Every day | ORAL | 2 refills | Status: DC

## 2017-07-31 NOTE — Patient Instructions (Signed)

## 2017-07-31 NOTE — Progress Notes (Signed)
Last pap ~2015

## 2017-07-31 NOTE — Progress Notes (Signed)
   PRENATAL VISIT NOTE  Subjective:  Traci [name] is a 27 y.o. G4P3003 at 3639w4d being seen today for ongoing prenatal care.  She is currently monitored for the following issues for this high-risk pregnancy and has History of gestational diabetes mellitus; History of pre-eclampsia; Supervision of high risk pregnancy, antepartum, second trimester; Ventral hernia; Monochorionic diamniotic twin gestation; GBS bacteriuria; Alpha thalassemia silent carrier; and GDM (gestational diabetes mellitus) on their problem list.  Patient reports no complaints.  Contractions: Not present.  .  Movement: Present. Denies leaking of fluid.   The following portions of the patient's history were reviewed and updated as appropriate: allergies, current medications, past family history, past medical history, past social history, past surgical history and problem list. Problem list updated.  Objective:   Vitals:   07/31/17 1029  BP: 131/82  Pulse: (!) 101  Weight: 191 lb (86.6 kg)    Fetal Status: Fetal Heart Rate (bpm): 131/141   Movement: Present     General:  Alert, oriented and cooperative. Patient is in no acute distress.  Skin: Skin is warm and dry. No rash noted.   Cardiovascular: Normal heart rate noted  Respiratory: Normal respiratory effort, no problems with respiration noted  Abdomen: Soft, gravid, appropriate for gestational age.  Pain/Pressure: Absent     Pelvic: Cervical exam deferred        Extremities: Normal range of motion.  Edema: None  Mental Status: Normal mood and affect. Normal behavior. Normal judgment and thought content.   Assessment and Plan:  Pregnancy: G4P3003 at 8339w4d  1. Diet controlled gestational diabetes mellitus (GDM) in third trimester FBS 84-124 all but 2 are out of range 2 hour pp all < 120 - Babyscripts diabetes Declined insulin and in a shared decision making model, elected for metformin. Will add it nightly. - metFORMIN (GLUCOPHAGE) 500 MG tablet; Take 1 tablet  (500 mg total) by mouth at bedtime.  Dispense: 30 tablet; Refill: 2  2. Supervision of high risk pregnancy, antepartum, second trimester  3. Monochorionic diamniotic twin gestation in third trimester For weekly testing at 32 wks with MFM Last growth showed concordant growth delivery between 37-38 wks  4. History of pre-eclampsia Continue ASA  Preterm labor symptoms and general obstetric precautions including but not limited to vaginal bleeding, contractions, leaking of fluid and fetal movement were reviewed in detail with the patient. Please refer to After Visit Summary for other counseling recommendations.  Return in 2 weeks (on 08/14/2017).  Future Appointments  Date Time Provider Department Center  08/09/2017  2:00 PM [facility] US 3 [facility]-US  08/14/2017  9:00 AM [place] BingPickens, Charlie, MD [facility]  08/16/2017 11:00 AM [facility] US 3 [facility]-US  08/23/2017  1:00 PM [facility] US 3 [facility]    Reva Boresanya S Odalis Jordan, MD

## 2017-08-09 ENCOUNTER — Ambulatory Visit (HOSPITAL_COMMUNITY)
Admission: RE | Admit: 2017-08-09 | Discharge: 2017-08-09 | Disposition: A | Discharge status: Home / Self Care | Payer: Medicaid Other | Source: Ambulatory Visit | Attending: Student | Admitting: Student

## 2017-08-09 ENCOUNTER — Other Ambulatory Visit (HOSPITAL_COMMUNITY): Payer: Self-pay | Admitting: Obstetrics and Gynecology

## 2017-08-09 ENCOUNTER — Encounter (HOSPITAL_COMMUNITY): Payer: Self-pay

## 2017-08-09 DIAGNOSIS — O09293 Supervision of pregnancy with other poor reproductive or obstetric history, third trimester: Secondary | ICD-10-CM | POA: Insufficient documentation

## 2017-08-09 DIAGNOSIS — O30033 Twin pregnancy, monochorionic/diamniotic, third trimester: Secondary | ICD-10-CM | POA: Diagnosis not present

## 2017-08-09 DIAGNOSIS — O0992 Supervision of high risk pregnancy, unspecified, second trimester: Secondary | ICD-10-CM

## 2017-08-09 DIAGNOSIS — Z3A31 31 weeks gestation of pregnancy: Secondary | ICD-10-CM | POA: Insufficient documentation

## 2017-08-09 DIAGNOSIS — D563 Thalassemia minor: Secondary | ICD-10-CM

## 2017-08-09 NOTE — Addendum Note (Signed)
Encounter addended by: Emeline Darling on: 08/09/2017 3:10 PM  Actions taken: Imaging Exam ended

## 2017-08-14 ENCOUNTER — Ambulatory Visit (INDEPENDENT_AMBULATORY_CARE_PROVIDER_SITE_OTHER): Payer: Medicaid Other | Admitting: Obstetrics and Gynecology

## 2017-08-14 VITALS — BP 116/81 | HR 94 | Wt 195.4 lb

## 2017-08-14 DIAGNOSIS — O30033 Twin pregnancy, monochorionic/diamniotic, third trimester: Secondary | ICD-10-CM

## 2017-08-14 DIAGNOSIS — O0992 Supervision of high risk pregnancy, unspecified, second trimester: Secondary | ICD-10-CM

## 2017-08-14 DIAGNOSIS — R8271 Bacteriuria: Secondary | ICD-10-CM

## 2017-08-14 DIAGNOSIS — O24419 Gestational diabetes mellitus in pregnancy, unspecified control: Secondary | ICD-10-CM

## 2017-08-14 DIAGNOSIS — Z8759 Personal history of other complications of pregnancy, childbirth and the puerperium: Secondary | ICD-10-CM

## 2017-08-14 NOTE — Progress Notes (Signed)
Prenatal Visit Note Date: 08/14/2017 Clinic: [facility]  Subjective:  Traci [name] is a 27 y.o. [phone] at [redacted]w[redacted]d being seen today for ongoing prenatal care.  She is currently monitored for the following issues for this high-risk pregnancy and has History of pre-eclampsia; Supervision of high risk pregnancy, antepartum, second trimester; Ventral hernia; Monochorionic diamniotic twin gestation; GBS bacteriuria; Alpha thalassemia silent carrier; and GDM, class A2 on their problem list.  Patient reports no complaints.   Contractions: Not present. Vag. Bleeding: None.  Movement: Present. Denies leaking of fluid.   The following portions of the patient's history were reviewed and updated as appropriate: allergies, current medications, past family history, past medical history, past social history, past surgical history and problem list. Problem list updated.  Objective:   Vitals:   08/14/17 0922  BP: 116/81  Pulse: 94  Weight: 195 lb 6.4 oz (88.6 kg)    Fetal Status: Fetal Heart Rate (bpm): 140s/130s   Movement: Present     General:  Alert, oriented and cooperative. Patient is in no acute distress.  Skin: Skin is warm and dry. No rash noted.   Cardiovascular: Normal heart rate noted  Respiratory: Normal respiratory effort, no problems with respiration noted  Abdomen: Soft, gravid, appropriate for gestational age. Pain/Pressure: Absent     Pelvic:  Cervical exam deferred        Extremities: Normal range of motion.  Edema: None  Mental Status: Normal mood and affect. Normal behavior. Normal judgment and thought content.   Urinalysis:      Assessment and Plan:  Pregnancy: G4P3003 at [redacted]w[redacted]d  1. GDM, class A2 Continue with metformin 500 after she finishes dinner. BS log normal. Continue with weekly bpps with mfm  2. Supervision of high risk pregnancy, antepartum, second trimester Routine care. btl papers utd Patient to try and get in contact with [facility] surgery to  see about getting appt (see prior notes)  3. Monochorionic diamniotic twin gestation in third trimester See above. Normal ap testing. Desires VD. Called L&D and too early to schedule 6/11 am IOL. Will try nv  4. GBS bacteriuria Tx in labor  5. History of pre-eclampsia Continue low dose asa.   Preterm labor symptoms and general obstetric precautions including but not limited to vaginal bleeding, contractions, leaking of fluid and fetal movement were reviewed in detail with the patient. Please refer to After Visit Summary for other counseling recommendations.  Return in about 10 days (around 08/24/2017).   [place] Bing, MD

## 2017-08-16 ENCOUNTER — Other Ambulatory Visit (HOSPITAL_COMMUNITY): Payer: Self-pay | Admitting: Obstetrics and Gynecology

## 2017-08-16 ENCOUNTER — Other Ambulatory Visit (HOSPITAL_COMMUNITY): Payer: Self-pay | Admitting: *Deleted

## 2017-08-16 ENCOUNTER — Ambulatory Visit (HOSPITAL_COMMUNITY)
Admission: RE | Admit: 2017-08-16 | Discharge: 2017-08-16 | Disposition: A | Discharge status: Home / Self Care | Payer: Medicaid Other | Source: Ambulatory Visit | Attending: Student | Admitting: Student

## 2017-08-16 DIAGNOSIS — Z3A32 32 weeks gestation of pregnancy: Secondary | ICD-10-CM

## 2017-08-16 DIAGNOSIS — O30032 Twin pregnancy, monochorionic/diamniotic, second trimester: Secondary | ICD-10-CM | POA: Insufficient documentation

## 2017-08-16 DIAGNOSIS — O09293 Supervision of pregnancy with other poor reproductive or obstetric history, third trimester: Secondary | ICD-10-CM | POA: Insufficient documentation

## 2017-08-16 DIAGNOSIS — O30033 Twin pregnancy, monochorionic/diamniotic, third trimester: Secondary | ICD-10-CM

## 2017-08-16 DIAGNOSIS — O24415 Gestational diabetes mellitus in pregnancy, controlled by oral hypoglycemic drugs: Secondary | ICD-10-CM | POA: Insufficient documentation

## 2017-08-16 DIAGNOSIS — Z8632 Personal history of gestational diabetes: Secondary | ICD-10-CM

## 2017-08-22 ENCOUNTER — Telehealth: Payer: Self-pay | Admitting: Radiology

## 2017-08-22 NOTE — Telephone Encounter (Signed)
Patient called stating that she thinks that she lost some of her mucus plug over the weekend and that she thinks that she maybe contracting. Tighten and releasing in the bottom region of her abdomin. Explain to patient that see needs to go to [facility] to be evaluated for preterm labor. Patient expressed understanding.

## 2017-08-23 ENCOUNTER — Ambulatory Visit (HOSPITAL_COMMUNITY)
Admission: RE | Admit: 2017-08-23 | Payer: Medicaid Other | Source: Ambulatory Visit | Attending: Student | Admitting: Student

## 2017-08-26 ENCOUNTER — Other Ambulatory Visit (HOSPITAL_COMMUNITY): Payer: Self-pay | Admitting: Obstetrics and Gynecology

## 2017-08-26 ENCOUNTER — Encounter: Payer: Self-pay | Admitting: Obstetrics and Gynecology

## 2017-08-26 ENCOUNTER — Encounter (HOSPITAL_COMMUNITY): Payer: Self-pay

## 2017-08-26 ENCOUNTER — Ambulatory Visit (INDEPENDENT_AMBULATORY_CARE_PROVIDER_SITE_OTHER): Payer: Medicaid Other | Admitting: Obstetrics and Gynecology

## 2017-08-26 ENCOUNTER — Ambulatory Visit (HOSPITAL_COMMUNITY)
Admission: RE | Admit: 2017-08-26 | Discharge: 2017-08-26 | Disposition: A | Discharge status: Home / Self Care | Payer: Medicaid Other | Source: Ambulatory Visit | Attending: Obstetrics and Gynecology | Admitting: Obstetrics and Gynecology

## 2017-08-26 VITALS — BP 126/79 | HR 112 | Wt 200.0 lb

## 2017-08-26 DIAGNOSIS — Z3A34 34 weeks gestation of pregnancy: Secondary | ICD-10-CM

## 2017-08-26 DIAGNOSIS — O24415 Gestational diabetes mellitus in pregnancy, controlled by oral hypoglycemic drugs: Secondary | ICD-10-CM

## 2017-08-26 DIAGNOSIS — O0992 Supervision of high risk pregnancy, unspecified, second trimester: Secondary | ICD-10-CM

## 2017-08-26 DIAGNOSIS — Z362 Encounter for other antenatal screening follow-up: Secondary | ICD-10-CM | POA: Insufficient documentation

## 2017-08-26 DIAGNOSIS — O30033 Twin pregnancy, monochorionic/diamniotic, third trimester: Secondary | ICD-10-CM

## 2017-08-26 DIAGNOSIS — D563 Thalassemia minor: Secondary | ICD-10-CM

## 2017-08-26 DIAGNOSIS — O09293 Supervision of pregnancy with other poor reproductive or obstetric history, third trimester: Secondary | ICD-10-CM | POA: Insufficient documentation

## 2017-08-26 DIAGNOSIS — R8271 Bacteriuria: Secondary | ICD-10-CM

## 2017-08-26 DIAGNOSIS — O0993 Supervision of high risk pregnancy, unspecified, third trimester: Secondary | ICD-10-CM

## 2017-08-26 NOTE — Progress Notes (Signed)
Prenatal Visit Note Date: 08/26/2017 [facility]  Subjective:  Traci [name] is a 27 y.o. [phone] at [redacted]w[redacted]d being seen today for ongoing prenatal care.  She is currently monitored for the following issues for this high-risk pregnancy and has Hx of preeclampsia, prior pregnancy, currently pregnant, third trimester; Supervision of high risk pregnancy, antepartum, second trimester; Ventral hernia; Monochorionic diamniotic twin gestation; GBS bacteriuria; Alpha thalassemia silent carrier; Gestational diabetes mellitus (GDM) in third trimester controlled on oral hypoglycemic drug; and [redacted] weeks gestation of pregnancy on their problem list.  Patient reports no complaints.   Contractions: Irregular. Vag. Bleeding: None.  Movement: Present. Denies leaking of fluid.   The following portions of the patient's history were reviewed and updated as appropriate: allergies, current medications, past family history, past medical history, past social history, past surgical history and problem list. Problem list updated.  Objective:   Vitals:   08/26/17 1441  BP: 126/79  Pulse: (!) 112  Weight: 200 lb (90.7 kg)    Fetal Status: Fetal Heart Rate (bpm): 140/148   Movement: Present     General:  Alert, oriented and cooperative. Patient is in no acute distress.  Skin: Skin is warm and dry. No rash noted.   Cardiovascular: Normal heart rate noted  Respiratory: Normal respiratory effort, no problems with respiration noted  Abdomen: Soft, gravid, appropriate for gestational age. Pain/Pressure: Present     Pelvic:  Cervical exam deferred        Extremities: Normal range of motion.  Edema: None  Mental Status: Normal mood and affect. Normal behavior. Normal judgment and thought content.   Urinalysis:      Assessment and Plan:  Pregnancy: G4P3003 at [redacted]w[redacted]d  1. Gestational diabetes mellitus (GDM) in third trimester controlled on oral hypoglycemic drug Doing well on metformin 500  after dinner. Reassuring bpp today. Continue with weekly testing  2. Supervision of high risk pregnancy, antepartum, second trimester Routine care. BTL papers already signed  3. Monochorionic diamniotic twin gestation in third trimester Normal growth x 2 today. Desires VD. Unable to set up IOL today b/c too far out. Will try early next week  4. GBS bacteriuria tx in labor  Preterm labor symptoms and general obstetric precautions including but not limited to vaginal bleeding, contractions, leaking of fluid and fetal movement were reviewed in detail with the patient. Please refer to After Visit Summary for other counseling recommendations.  Return in about 10 days (around 09/05/2017) for 10d hrob visit.   [place] Bing, MD

## 2017-08-30 ENCOUNTER — Ambulatory Visit (HOSPITAL_COMMUNITY): Admission: RE | Admit: 2017-08-30 | Payer: Medicaid Other | Source: Ambulatory Visit

## 2017-09-03 ENCOUNTER — Ambulatory Visit (HOSPITAL_COMMUNITY)
Admission: RE | Admit: 2017-09-03 | Discharge: 2017-09-03 | Disposition: A | Discharge status: Home / Self Care | Payer: Medicaid Other | Source: Ambulatory Visit | Attending: Obstetrics and Gynecology | Admitting: Obstetrics and Gynecology

## 2017-09-03 ENCOUNTER — Other Ambulatory Visit (HOSPITAL_COMMUNITY): Payer: Self-pay | Admitting: Obstetrics and Gynecology

## 2017-09-03 ENCOUNTER — Telehealth: Payer: Self-pay

## 2017-09-03 ENCOUNTER — Encounter (HOSPITAL_COMMUNITY): Payer: Self-pay

## 2017-09-03 DIAGNOSIS — Z3A35 35 weeks gestation of pregnancy: Secondary | ICD-10-CM

## 2017-09-03 DIAGNOSIS — O09293 Supervision of pregnancy with other poor reproductive or obstetric history, third trimester: Secondary | ICD-10-CM

## 2017-09-03 DIAGNOSIS — O24415 Gestational diabetes mellitus in pregnancy, controlled by oral hypoglycemic drugs: Secondary | ICD-10-CM

## 2017-09-03 DIAGNOSIS — O30033 Twin pregnancy, monochorionic/diamniotic, third trimester: Secondary | ICD-10-CM | POA: Diagnosis present

## 2017-09-03 DIAGNOSIS — Z8632 Personal history of gestational diabetes: Secondary | ICD-10-CM

## 2017-09-03 DIAGNOSIS — O0992 Supervision of high risk pregnancy, unspecified, second trimester: Secondary | ICD-10-CM

## 2017-09-03 DIAGNOSIS — D563 Thalassemia minor: Secondary | ICD-10-CM

## 2017-09-03 NOTE — Telephone Encounter (Signed)
Scheduled induction has been set up

## 2017-09-04 ENCOUNTER — Ambulatory Visit (INDEPENDENT_AMBULATORY_CARE_PROVIDER_SITE_OTHER): Payer: Medicaid Other | Admitting: Family Medicine

## 2017-09-04 ENCOUNTER — Telehealth (HOSPITAL_COMMUNITY): Payer: Self-pay | Admitting: *Deleted

## 2017-09-04 VITALS — BP 135/81 | HR 105 | Wt 201.0 lb

## 2017-09-04 DIAGNOSIS — O0992 Supervision of high risk pregnancy, unspecified, second trimester: Secondary | ICD-10-CM

## 2017-09-04 DIAGNOSIS — O24415 Gestational diabetes mellitus in pregnancy, controlled by oral hypoglycemic drugs: Secondary | ICD-10-CM

## 2017-09-04 DIAGNOSIS — R8271 Bacteriuria: Secondary | ICD-10-CM

## 2017-09-04 DIAGNOSIS — O09293 Supervision of pregnancy with other poor reproductive or obstetric history, third trimester: Secondary | ICD-10-CM

## 2017-09-04 NOTE — Telephone Encounter (Signed)
Preadmission screen  

## 2017-09-06 ENCOUNTER — Ambulatory Visit (HOSPITAL_COMMUNITY): Payer: Medicaid Other

## 2017-09-09 ENCOUNTER — Encounter (HOSPITAL_COMMUNITY): Payer: Self-pay | Admitting: *Deleted

## 2017-09-09 ENCOUNTER — Telehealth (HOSPITAL_COMMUNITY): Payer: Self-pay | Admitting: *Deleted

## 2017-09-09 NOTE — Telephone Encounter (Signed)
Preadmission screen  

## 2017-09-10 ENCOUNTER — Ambulatory Visit (HOSPITAL_COMMUNITY)
Admission: RE | Admit: 2017-09-10 | Discharge: 2017-09-10 | Disposition: A | Discharge status: Home / Self Care | Payer: Medicaid Other | Source: Ambulatory Visit | Attending: Family Medicine | Admitting: Family Medicine

## 2017-09-10 ENCOUNTER — Encounter (HOSPITAL_COMMUNITY): Payer: Self-pay

## 2017-09-11 NOTE — Progress Notes (Signed)
   PRENATAL VISIT NOTE  Subjective:  Traci RoysSHATONA U Rippeon is a 27 y.o. G4P3003 at 5254w4d being seen today for ongoing prenatal care.  She is currently monitored for the following issues for this high-risk pregnancy and has Hx of preeclampsia, prior pregnancy, currently pregnant, third trimester; Supervision of high risk pregnancy, antepartum, second trimester; Ventral hernia; Monochorionic diamniotic twin gestation; GBS bacteriuria; Alpha thalassemia silent carrier; Gestational diabetes mellitus (GDM) in third trimester controlled on oral hypoglycemic drug; and [redacted] weeks gestation of pregnancy on their problem list.  No complaints today  The following portions of the patient's history were reviewed and updated as appropriate: allergies, current medications, past family history, past medical history, past social history, past surgical history and problem list. Problem list updated.  Objective:   Vitals:   09/04/17 1502  BP: 135/81  Pulse: (!) 105  Weight: 201 lb (91.2 kg)    Fetal Status: Fetal Heart Rate (bpm): 134/137         General:  Alert, oriented and cooperative. Patient is in no acute distress.  Skin: Skin is warm and dry. No rash noted.   Cardiovascular: Normal heart rate noted  Respiratory: Normal respiratory effort, no problems with respiration noted  Abdomen: Soft, gravid, appropriate for gestational age.        Pelvic: Cervical exam deferred        Extremities: Normal range of motion.  Edema: None  Mental Status: Normal mood and affect. Normal behavior. Normal judgment and thought content.   Assessment and Plan:  Pregnancy: G4P3003 at 5354w4d  1. Gestational diabetes mellitus (GDM) in third trimester controlled on oral hypoglycemic drug Good control. Continue current meds  2. Hx of preeclampsia, prior pregnancy, currently pregnant, third trimester BP   3. Supervision of high risk pregnancy, antepartum, second trimester UTD Has IOL schedule  4. GBS bacteriuria PCN in  labor  Preterm labor symptoms and general obstetric precautions including but not limited to vaginal bleeding, contractions, leaking of fluid and fetal movement were reviewed in detail with the patient. Please refer to After Visit Summary for other counseling recommendations.  Return in about 1 week (around 09/11/2017) for Routine prenatal care.  Future Appointments  Date Time Provider Department Center  09/12/2017 11:00 AM [place] BingPickens, Charlie, MD [facility]  09/14/2017 12:00 AM [facility] ROOM [facility] None    Federico FlakeKimberly Niles Newton, MD

## 2017-09-12 ENCOUNTER — Inpatient Hospital Stay (HOSPITAL_COMMUNITY): Payer: Medicaid Other

## 2017-09-12 ENCOUNTER — Ambulatory Visit (INDEPENDENT_AMBULATORY_CARE_PROVIDER_SITE_OTHER): Payer: Medicaid Other | Admitting: Obstetrics and Gynecology

## 2017-09-12 ENCOUNTER — Encounter (HOSPITAL_COMMUNITY): Payer: Self-pay

## 2017-09-12 ENCOUNTER — Encounter: Payer: Self-pay | Admitting: Obstetrics and Gynecology

## 2017-09-12 ENCOUNTER — Other Ambulatory Visit: Payer: Self-pay

## 2017-09-12 ENCOUNTER — Other Ambulatory Visit (HOSPITAL_COMMUNITY)
Admission: RE | Admit: 2017-09-12 | Discharge: 2017-09-12 | Disposition: A | Discharge status: Home / Self Care | Payer: Medicaid Other | Source: Ambulatory Visit | Attending: Obstetrics and Gynecology | Admitting: Obstetrics and Gynecology

## 2017-09-12 ENCOUNTER — Inpatient Hospital Stay (HOSPITAL_COMMUNITY)
Admission: AD | Admit: 2017-09-12 | Discharge: 2017-09-12 | Disposition: A | Discharge status: Home / Self Care | Payer: Medicaid Other | Source: Ambulatory Visit | Attending: Obstetrics & Gynecology | Admitting: Obstetrics & Gynecology

## 2017-09-12 VITALS — BP 131/82 | HR 86

## 2017-09-12 DIAGNOSIS — Z7984 Long term (current) use of oral hypoglycemic drugs: Secondary | ICD-10-CM | POA: Diagnosis not present

## 2017-09-12 DIAGNOSIS — O30009 Twin pregnancy, unspecified number of placenta and unspecified number of amniotic sacs, unspecified trimester: Secondary | ICD-10-CM

## 2017-09-12 DIAGNOSIS — Z79899 Other long term (current) drug therapy: Secondary | ICD-10-CM | POA: Insufficient documentation

## 2017-09-12 DIAGNOSIS — Z7982 Long term (current) use of aspirin: Secondary | ICD-10-CM | POA: Insufficient documentation

## 2017-09-12 DIAGNOSIS — Z87440 Personal history of urinary (tract) infections: Secondary | ICD-10-CM | POA: Insufficient documentation

## 2017-09-12 DIAGNOSIS — R8271 Bacteriuria: Secondary | ICD-10-CM

## 2017-09-12 DIAGNOSIS — O30033 Twin pregnancy, monochorionic/diamniotic, third trimester: Secondary | ICD-10-CM | POA: Insufficient documentation

## 2017-09-12 DIAGNOSIS — O24415 Gestational diabetes mellitus in pregnancy, controlled by oral hypoglycemic drugs: Secondary | ICD-10-CM

## 2017-09-12 DIAGNOSIS — O0992 Supervision of high risk pregnancy, unspecified, second trimester: Secondary | ICD-10-CM

## 2017-09-12 DIAGNOSIS — O24419 Gestational diabetes mellitus in pregnancy, unspecified control: Secondary | ICD-10-CM

## 2017-09-12 DIAGNOSIS — Z3A36 36 weeks gestation of pregnancy: Secondary | ICD-10-CM | POA: Diagnosis not present

## 2017-09-12 DIAGNOSIS — D563 Thalassemia minor: Secondary | ICD-10-CM

## 2017-09-12 NOTE — MAU Provider Note (Signed)
History     CSN: [id number]  Arrival date and time: 09/12/17 1303   First Provider Initiated Contact with Patient 09/12/17 1419      Chief Complaint  Patient presents with  . Non-stress Test   HPI  Ms. Traci [name] is a 27 y.o. [phone] at [redacted]w[redacted]d who presents to MAU today from the [facility] for further monitoring and BPP. The patient was seen for her routine prenatal visit and NST. They were unable to trace both babies in the office. She also missed her BPP appointment last week. She is in antenatal testing for GDM and mo/di twins. She denies vaginal bleeding or LOF. She is having irregular contractions. She reports normal fetal movement x 2.    OB History    Gravida  4   Para  3   Term  3   Preterm      AB      Living  3     SAB      TAB      Ectopic      Multiple  0   Live Births  3        Obstetric Comments  #1 high blood pressue # 2 gest diab, diet controlled #3 gest diab, on oral med        Past Medical History:  Diagnosis Date  . Gestational diabetes    metformin  . Infection    UTI  . Preeclampsia   . Pregnancy induced hypertension   . Ventral hernia without obstruction or gangrene    reduced in the ER 2016    Past Surgical History:  Procedure Laterality Date  . NO PAST SURGERIES    . tooth extaction      Family History  Problem Relation Age of Onset  . Diabetes Maternal Grandmother     Social History   Tobacco Use  . Smoking status: Never Smoker  . Smokeless tobacco: Never Used  Substance Use Topics  . Alcohol use: No    Alcohol/week: 0.0 oz  . Drug use: No    Allergies: No Known Allergies  Medications Prior to Admission  Medication Sig Dispense Refill Last Dose  . aspirin EC 81 MG tablet Take 1 tablet (81 mg total) by mouth daily. 90 tablet 2 09/12/2017 at Unknown time  . folic acid (FOLVITE) 1 MG tablet Take 1 tablet (1 mg total) by mouth daily. This is extra folic acid because you have twins 60 tablet 3 09/12/2017 at  Unknown time  . metFORMIN (GLUCOPHAGE) 500 MG tablet Take 1 tablet (500 mg total) by mouth at bedtime. 30 tablet 2 09/11/2017 at Unknown time  . omeprazole (PRILOSEC) 20 MG capsule Take 1 capsule (20 mg total) by mouth daily. 30 capsule 2 09/11/2017 at Unknown time  . Prenatal Multivit-Min-Fe-FA (PRENATAL VITAMINS) 0.8 MG tablet Take 1 tablet by mouth daily. 30 tablet 12 09/12/2017 at Unknown time  . ACCU-CHEK FASTCLIX LANCETS MISC Check blood sugars 4 times a day 100 each 2 Taking  . docusate sodium (COLACE) 100 MG capsule Take 1 capsule (100 mg total) by mouth 2 (two) times daily as needed. (Patient not taking: Reported on 09/12/2017) 60 capsule 3 Not Taking at Unknown time  . glucose blood test strip Check blood sugars 4 times a day 100 each 12 Taking  . ondansetron (ZOFRAN) 4 MG tablet Take 1 tablet (4 mg total) by mouth every 8 (eight) hours as needed for nausea or vomiting. (Patient not taking: Reported on 09/12/2017)  20 tablet 0 Not Taking at Unknown time  . polyethylene glycol (MIRALAX / GLYCOLAX) packet Take 17 g by mouth daily. (Patient not taking: Reported on 09/12/2017) 30 each 2 Not Taking at Unknown time  . Prenatal MV & Min w/FA-DHA (PRENATAL ADULT GUMMY/DHA/FA) 0.4-25 MG CHEW Chew 1 tablet by mouth daily. (Patient not taking: Reported on 09/12/2017) 60 tablet 5 Not Taking at Unknown time  . promethazine (PHENERGAN) 25 MG tablet Take 1 tablet (25 mg total) by mouth every 6 (six) hours as needed for nausea or vomiting. (Patient not taking: Reported on 09/12/2017) 60 tablet 3 Not Taking at Unknown time  . ranitidine (ZANTAC) 150 MG tablet Take 1 tablet (150 mg total) by mouth 2 (two) times daily. (Patient not taking: Reported on 09/12/2017) 60 tablet 3 Not Taking at Unknown time  . scopolamine (TRANSDERM-SCOP) 1 MG/3DAYS Place 1 patch (1.5 mg total) onto the skin every 3 (three) days. (Patient not taking: Reported on 09/12/2017) 4 patch 1 Not Taking at Unknown time  . terconazole (TERAZOL 7) 0.4 % vaginal  cream Place 1 applicator vaginally at bedtime. Use for seven days (Patient not taking: Reported on 09/12/2017) 45 g 0 Not Taking at Unknown time    Review of Systems  Constitutional: Negative for fever.  Gastrointestinal: Positive for abdominal pain.  Genitourinary: Negative for vaginal bleeding and vaginal discharge.   Physical Exam   Blood pressure 136/86, pulse 83, temperature 98.5 F (36.9 C), temperature source Oral, resp. rate 18, height 5' 4.02" (1.626 m), weight 205 lb (93 kg), last menstrual period 12/29/2016, SpO2 99 %, currently breastfeeding.  Physical Exam  Nursing note and vitals reviewed. Constitutional: She is oriented to person, place, and time. She appears well-developed and well-nourished. No distress.  HENT:  Head: Normocephalic and atraumatic.  Cardiovascular: Normal rate.  Respiratory: Effort normal.  GI: Soft. She exhibits no distension and no mass. There is no tenderness. There is no rebound and no guarding.  Neurological: She is alert and oriented to person, place, and time.  Skin: Skin is warm and dry. No erythema.  Psychiatric: She has a normal mood and affect.    Fetal Monitoring: Baseline A: 140 bpm Variability: moderate Accelerations: 15 x 15 Decelerations: none Baseline B: 125 bpm Variability: moderate Accelerations: 15 x 15 Decelerations: none Contractions: q 4-8 minutes, mild  Baby B was difficult to find initially, eventually we did trace both babies MAU Course  Procedures None  MDM BPP and limited OB US ordered BPP 8/8 for both Assessment and Plan  A: Mono/Di Twins at 3151w5d GDM Reactive NST x2  P: Discharge home Kick counts and preterm labor precautions discussed Patient advised to follow-up as scheduled for IOL 09/14/17 or sooner PRN Patient may return to MAU as needed or if her condition were to change or worsen  Vonzella NippleJulie Wenzel, PA-C 09/12/2017, 3:30 PM

## 2017-09-12 NOTE — MAU Note (Signed)
Pt was in office today, unable to trace one of the babies so she was sent here to have u/s and bpp.

## 2017-09-12 NOTE — MAU Note (Deleted)
Pt reports lower back pain x 2 weeks, radiates down her right leg. Was seen in the ED one week ago and given pain meds but they are not helping, reports it is hard to walk.

## 2017-09-12 NOTE — Addendum Note (Signed)
Addended by: Cheree DittoGRAHAM, Saud Bail A on: 09/12/2017 03:54 PM   Modules accepted: Kipp BroodSmartSet

## 2017-09-12 NOTE — Progress Notes (Signed)
Prenatal Visit Note Date: 09/12/2017 Clinic: [facility]  Subjective:  Traci [name] is a 27 y.o. [phone] at 555w5d being seen today for ongoing prenatal care.  She is currently monitored for the following issues for this high-risk pregnancy and has Hx of preeclampsia, prior pregnancy, currently pregnant, third trimester; Supervision of high risk pregnancy, antepartum, second trimester; Ventral hernia; Monochorionic diamniotic twin gestation; GBS bacteriuria; Alpha thalassemia silent carrier; and Gestational diabetes mellitus (GDM) in third trimester controlled on oral hypoglycemic drug on their problem list.  Patient reports no complaints.   Contractions: Irregular. Vag. Bleeding: None.  Movement: Present. Denies leaking of fluid.   The following portions of the patient's history were reviewed and updated as appropriate: allergies, current medications, past family history, past medical history, past social history, past surgical history and problem list. Problem list updated.  Objective:   Vitals:   09/12/17 1111  BP: 131/82  Pulse: 86    Fetal Status: Fetal Heart Rate (bpm): 140s/150s   Movement: Present     General:  Alert, oriented and cooperative. Patient is in no acute distress.  Skin: Skin is warm and dry. No rash noted.   Cardiovascular: Normal heart rate noted  Respiratory: Normal respiratory effort, no problems with respiration noted  Abdomen: Soft, gravid, appropriate for gestational age. Pain/Pressure: Present     Pelvic:  Cervical exam deferred        Extremities: Normal range of motion.  Edema: None  Mental Status: Normal mood and affect. Normal behavior. Normal judgment and thought content.   Urinalysis:      Assessment and Plan:  Pregnancy: G4P3003 at 3455w5d  1. Supervision of high risk pregnancy, antepartum, second trimester Routine care.  BTL papers UTD - GC[facility]Chlam[facility] probe amp [facility]  2. Monochorionic diamniotic twin  gestation in third trimester For IOL tomorrow night. Missed 6/4 bpp. For NST today in clinic. MVP 6 and 7 and vertex, oblique (head at 8 o'clock for fetus b), +fluid in bladder x 2 and no e/o hydrops x 2. Normal growth and low discordance on 5/20. Pt desires VD  3. Gestational diabetes mellitus (GDM) in third trimester controlled on oral hypoglycemic drug Didn't bring log book but states values or normal. Continue with metformin qhs  4. GBS bacteriuria tx in labor  Term labor symptoms and general obstetric precautions including but not limited to vaginal bleeding, contractions, leaking of fluid and fetal movement were reviewed in detail with the patient. Please refer to After Visit Summary for other counseling recommendations.  4wk PP visit with 2hr GTT   Traci [name], Traci Sizemore, MD

## 2017-09-12 NOTE — Discharge Instructions (Signed)
Braxton Hicks Contractions °Contractions of the uterus can occur throughout pregnancy, but they are not always a sign that you are in labor. You may have practice contractions called Braxton Hicks contractions. These false labor contractions are sometimes confused with true labor. °What are Braxton Hicks contractions? °Braxton Hicks contractions are tightening movements that occur in the muscles of the uterus before labor. Unlike true labor contractions, these contractions do not result in opening (dilation) and thinning of the cervix. Toward the end of pregnancy (32-34 weeks), Braxton Hicks contractions can happen more often and may become stronger. These contractions are sometimes difficult to tell apart from true labor because they can be very uncomfortable. You should not feel embarrassed if you go to the hospital with false labor. °Sometimes, the only way to tell if you are in true labor is for your health care provider to look for changes in the cervix. The health care provider will do a physical exam and may monitor your contractions. If you are not in true labor, the exam should show that your cervix is not dilating and your water has not broken. °If there are other health problems associated with your pregnancy, it is completely safe for you to be sent home with false labor. You may continue to have Braxton Hicks contractions until you go into true labor. °How to tell the difference between true labor and false labor °True labor °· Contractions last 30-70 seconds. °· Contractions become very regular. °· Discomfort is usually felt in the top of the uterus, and it spreads to the lower abdomen and low back. °· Contractions do not go away with walking. °· Contractions usually become more intense and increase in frequency. °· The cervix dilates and gets thinner. °False labor °· Contractions are usually shorter and not as strong as true labor contractions. °· Contractions are usually irregular. °· Contractions  are often felt in the front of the lower abdomen and in the groin. °· Contractions may go away when you walk around or change positions while lying down. °· Contractions get weaker and are shorter-lasting as time goes on. °· The cervix usually does not dilate or become thin. °Follow these instructions at home: °· Take over-the-counter and prescription medicines only as told by your health care provider. °· Keep up with your usual exercises and follow other instructions from your health care provider. °· Eat and drink lightly if you think you are going into labor. °· If Braxton Hicks contractions are making you uncomfortable: °? Change your position from lying down or resting to walking, or change from walking to resting. °? Sit and rest in a tub of warm water. °? Drink enough fluid to keep your urine pale yellow. Dehydration may cause these contractions. °? Do slow and deep breathing several times an hour. °· Keep all follow-up prenatal visits as told by your health care provider. This is important. °Contact a health care provider if: °· You have a fever. °· You have continuous pain in your abdomen. °Get help right away if: °· Your contractions become stronger, more regular, and closer together. °· You have fluid leaking or gushing from your vagina. °· You pass blood-tinged mucus (bloody show). °· You have bleeding from your vagina. °· You have low back pain that you never had before. °· You feel your baby’s head pushing down and causing pelvic pressure. °· Your baby is not moving inside you as much as it used to. °Summary °· Contractions that occur before labor are called Braxton   Hicks contractions, false labor, or practice contractions. °· Braxton Hicks contractions are usually shorter, weaker, farther apart, and less regular than true labor contractions. True labor contractions usually become progressively stronger and regular and they become more frequent. °· Manage discomfort from Braxton Hicks contractions by  changing position, resting in a warm bath, drinking plenty of water, or practicing deep breathing. °This information is not intended to replace advice given to you by your health care provider. Make sure you discuss any questions you have with your health care provider. °Document Released: 08/09/2016 Document Revised: 08/09/2016 Document Reviewed: 08/09/2016 °Elsevier Interactive Patient Education © 2018 [employer] ° °Fetal Movement Counts °Patient Name: ________________________________________________ Patient Due Date: ____________________ °What is a fetal movement count? °A fetal movement count is the number of times that you feel your baby move during a certain amount of time. This may also be called a fetal kick count. A fetal movement count is recommended for every pregnant woman. You may be asked to start counting fetal movements as early as week 28 of your pregnancy. °Pay attention to when your baby is most active. You may notice your baby's sleep and wake cycles. You may also notice things that make your baby move more. You should do a fetal movement count: °· When your baby is normally most active. °· At the same time each day. ° °A good time to count movements is while you are resting, after having something to eat and drink. °How do I count fetal movements? °1. Find a quiet, comfortable area. Sit, or lie down on your side. °2. Write down the date, the start time and stop time, and the number of movements that you felt between those two times. Take this information with you to your health care visits. °3. For 2 hours, count kicks, flutters, swishes, rolls, and jabs. You should feel at least 10 movements during 2 hours. °4. You may stop counting after you have felt 10 movements. °5. If you do not feel 10 movements in 2 hours, have something to eat and drink. Then, keep resting and counting for 1 hour. If you feel at least 4 movements during that hour, you may stop counting. °Contact a health care  provider if: °· You feel fewer than 4 movements in 2 hours. °· Your baby is not moving like he or she usually does. °Date: ____________ Start time: ____________ Stop time: ____________ Movements: ____________ °Date: ____________ Start time: ____________ Stop time: ____________ Movements: ____________ °Date: ____________ Start time: ____________ Stop time: ____________ Movements: ____________ °Date: ____________ Start time: ____________ Stop time: ____________ Movements: ____________ °Date: ____________ Start time: ____________ Stop time: ____________ Movements: ____________ °Date: ____________ Start time: ____________ Stop time: ____________ Movements: ____________ °Date: ____________ Start time: ____________ Stop time: ____________ Movements: ____________ °Date: ____________ Start time: ____________ Stop time: ____________ Movements: ____________ °Date: ____________ Start time: ____________ Stop time: ____________ Movements: ____________ °This information is not intended to replace advice given to you by your health care provider. Make sure you discuss any questions you have with your health care provider. °Document Released: 04/25/2006 Document Revised: 11/23/2015 Document Reviewed: 05/05/2015 °Elsevier Interactive Patient Education © 2018 Elsevier Inc. ° °

## 2017-09-12 NOTE — Progress Notes (Signed)
Pt has not entered any CBGs in BR[facility] app since 5/26

## 2017-09-13 LAB — GC/CHLAMYDIA PROBE AMP (~~LOC~~) NOT AT ARMC
CHLAMYDIA, DNA PROBE: NEGATIVE
NEISSERIA GONORRHEA: NEGATIVE

## 2017-09-14 ENCOUNTER — Other Ambulatory Visit: Payer: Self-pay

## 2017-09-14 ENCOUNTER — Encounter (HOSPITAL_COMMUNITY): Payer: Self-pay

## 2017-09-14 ENCOUNTER — Inpatient Hospital Stay (HOSPITAL_COMMUNITY): Payer: Medicaid Other | Admitting: Anesthesiology

## 2017-09-14 ENCOUNTER — Inpatient Hospital Stay (HOSPITAL_COMMUNITY)
Admission: RE | Admit: 2017-09-14 | Discharge: 2017-09-16 | DRG: 798 | Disposition: A | Discharge status: Home / Self Care | Payer: Medicaid Other | Attending: Obstetrics & Gynecology | Admitting: Obstetrics & Gynecology

## 2017-09-14 VITALS — BP 112/68 | HR 70 | Temp 98.0°F | Resp 18 | Ht 64.0 in | Wt 208.7 lb

## 2017-09-14 DIAGNOSIS — O24425 Gestational diabetes mellitus in childbirth, controlled by oral hypoglycemic drugs: Secondary | ICD-10-CM | POA: Diagnosis present

## 2017-09-14 DIAGNOSIS — O99824 Streptococcus B carrier state complicating childbirth: Secondary | ICD-10-CM | POA: Diagnosis present

## 2017-09-14 DIAGNOSIS — D563 Thalassemia minor: Secondary | ICD-10-CM

## 2017-09-14 DIAGNOSIS — O0992 Supervision of high risk pregnancy, unspecified, second trimester: Secondary | ICD-10-CM

## 2017-09-14 DIAGNOSIS — Z302 Encounter for sterilization: Secondary | ICD-10-CM

## 2017-09-14 DIAGNOSIS — D649 Anemia, unspecified: Secondary | ICD-10-CM | POA: Diagnosis present

## 2017-09-14 DIAGNOSIS — O30033 Twin pregnancy, monochorionic/diamniotic, third trimester: Secondary | ICD-10-CM | POA: Diagnosis present

## 2017-09-14 DIAGNOSIS — E669 Obesity, unspecified: Secondary | ICD-10-CM | POA: Diagnosis present

## 2017-09-14 DIAGNOSIS — O9902 Anemia complicating childbirth: Secondary | ICD-10-CM | POA: Diagnosis present

## 2017-09-14 DIAGNOSIS — O24415 Gestational diabetes mellitus in pregnancy, controlled by oral hypoglycemic drugs: Secondary | ICD-10-CM

## 2017-09-14 DIAGNOSIS — Z3A37 37 weeks gestation of pregnancy: Secondary | ICD-10-CM | POA: Diagnosis not present

## 2017-09-14 DIAGNOSIS — O99214 Obesity complicating childbirth: Secondary | ICD-10-CM | POA: Diagnosis present

## 2017-09-14 DIAGNOSIS — O24429 Gestational diabetes mellitus in childbirth, unspecified control: Secondary | ICD-10-CM | POA: Diagnosis not present

## 2017-09-14 DIAGNOSIS — Z8759 Personal history of other complications of pregnancy, childbirth and the puerperium: Secondary | ICD-10-CM

## 2017-09-14 LAB — GLUCOSE, CAPILLARY
GLUCOSE-CAPILLARY: 96 mg/dL (ref 65–99)
GLUCOSE-CAPILLARY: 90 mg/dL (ref 65–99)
GLUCOSE-CAPILLARY: 77 mg/dL (ref 65–99)
Glucose-Capillary: 73 mg/dL (ref 65–99)

## 2017-09-14 LAB — CBC
HEMATOCRIT: 31.1 % — AB (ref 36.0–46.0)
Hemoglobin: 10.3 g/dL — ABNORMAL LOW (ref 12.0–15.0)
MCH: 26.1 pg (ref 26.0–34.0)
MCHC: 33.1 g/dL (ref 30.0–36.0)
MCV: 78.7 fL (ref 78.0–100.0)
Platelets: 176 10*3/uL (ref 150–400)
RBC: 3.95 MIL/uL (ref 3.87–5.11)
RDW: 15.9 % — AB (ref 11.5–15.5)
WBC: 8.8 10*3/uL (ref 4.0–10.5)

## 2017-09-14 MED ORDER — LACTATED RINGERS IV SOLN
INTRAVENOUS | Status: DC
  Administered 2017-09-14 (×2): via INTRAVENOUS

## 2017-09-14 MED ORDER — OXYTOCIN 40 UNITS IN LACTATED RINGERS INFUSION - SIMPLE MED
2.5000 [IU]/h | INTRAVENOUS | Status: DC
  Filled 2017-09-14: qty 1000

## 2017-09-14 MED ORDER — PHENYLEPHRINE 40 MCG/ML (10ML) SYRINGE FOR IV PUSH (FOR BLOOD PRESSURE SUPPORT)
80.0000 ug | PREFILLED_SYRINGE | INTRAVENOUS | Status: DC | PRN
  Filled 2017-09-14: qty 5

## 2017-09-14 MED ORDER — SENNOSIDES-DOCUSATE SODIUM 8.6-50 MG PO TABS
2.0000 | ORAL_TABLET | ORAL | Status: DC
  Administered 2017-09-15: 2 via ORAL
  Filled 2017-09-14: qty 2

## 2017-09-14 MED ORDER — OXYTOCIN BOLUS FROM INFUSION
500.0000 mL | Freq: Once | INTRAVENOUS | Status: AC
  Administered 2017-09-14: 500 mL via INTRAVENOUS

## 2017-09-14 MED ORDER — DIPHENHYDRAMINE HCL 50 MG/ML IJ SOLN: 12.5000 mg | INTRAMUSCULAR | Status: DC | PRN

## 2017-09-14 MED ORDER — TERBUTALINE SULFATE 1 MG/ML IJ SOLN
0.2500 mg | Freq: Once | INTRAMUSCULAR | Status: DC | PRN
  Filled 2017-09-14: qty 1

## 2017-09-14 MED ORDER — MISOPROSTOL 50MCG HALF TABLET
50.0000 ug | ORAL_TABLET | ORAL | Status: DC | PRN
  Administered 2017-09-14: 50 ug via BUCCAL
  Filled 2017-09-14 (×2): qty 1

## 2017-09-14 MED ORDER — OXYCODONE-ACETAMINOPHEN 5-325 MG PO TABS: 1.0000 | ORAL_TABLET | ORAL | Status: DC | PRN

## 2017-09-14 MED ORDER — LIDOCAINE HCL (PF) 1 % IJ SOLN
30.0000 mL | INTRAMUSCULAR | Status: DC | PRN
  Filled 2017-09-14: qty 30

## 2017-09-14 MED ORDER — OXYCODONE HCL 5 MG PO TABS
10.0000 mg | ORAL_TABLET | ORAL | Status: DC | PRN
  Administered 2017-09-15: 10 mg via ORAL
  Filled 2017-09-14: qty 2

## 2017-09-14 MED ORDER — SODIUM CHLORIDE 0.9 % IV SOLN
5.0000 10*6.[IU] | Freq: Once | INTRAVENOUS | Status: AC
  Administered 2017-09-14: 5 10*6.[IU] via INTRAVENOUS
  Filled 2017-09-14: qty 5

## 2017-09-14 MED ORDER — PHENYLEPHRINE 40 MCG/ML (10ML) SYRINGE FOR IV PUSH (FOR BLOOD PRESSURE SUPPORT)
PREFILLED_SYRINGE | INTRAVENOUS | Status: AC
  Filled 2017-09-14: qty 20

## 2017-09-14 MED ORDER — LACTATED RINGERS IV SOLN
500.0000 mL | INTRAVENOUS | Status: DC | PRN
Start: 2017-09-14 — End: 2017-09-14

## 2017-09-14 MED ORDER — MISOPROSTOL 25 MCG QUARTER TABLET
25.0000 ug | ORAL_TABLET | ORAL | Status: DC | PRN
  Administered 2017-09-14: 25 ug via VAGINAL
  Filled 2017-09-14: qty 1

## 2017-09-14 MED ORDER — PENICILLIN G POT IN DEXTROSE 60000 UNIT/ML IV SOLN
3.0000 10*6.[IU] | INTRAVENOUS | Status: DC
  Administered 2017-09-14 (×3): 3 10*6.[IU] via INTRAVENOUS
  Filled 2017-09-14 (×6): qty 50

## 2017-09-14 MED ORDER — IBUPROFEN 600 MG PO TABS
600.0000 mg | ORAL_TABLET | Freq: Four times a day (QID) | ORAL | Status: DC
  Administered 2017-09-15 (×3): 600 mg via ORAL
  Filled 2017-09-14 (×4): qty 1

## 2017-09-14 MED ORDER — SIMETHICONE 80 MG PO CHEW: 80.0000 mg | CHEWABLE_TABLET | ORAL | Status: DC | PRN

## 2017-09-14 MED ORDER — FLEET ENEMA 7-19 GM/118ML RE ENEM: 1.0000 | ENEMA | RECTAL | Status: DC | PRN

## 2017-09-14 MED ORDER — ONDANSETRON HCL 4 MG/2ML IJ SOLN
4.0000 mg | Freq: Four times a day (QID) | INTRAMUSCULAR | Status: DC | PRN
  Administered 2017-09-14: 4 mg via INTRAVENOUS
  Filled 2017-09-14: qty 2

## 2017-09-14 MED ORDER — COCONUT OIL OIL: 1.0000 "application " | TOPICAL_OIL | Status: DC | PRN

## 2017-09-14 MED ORDER — DIBUCAINE 1 % RE OINT: 1.0000 "application " | TOPICAL_OINTMENT | RECTAL | Status: DC | PRN

## 2017-09-14 MED ORDER — ONDANSETRON HCL 4 MG PO TABS: 4.0000 mg | ORAL_TABLET | ORAL | Status: DC | PRN

## 2017-09-14 MED ORDER — ACETAMINOPHEN 325 MG PO TABS
650.0000 mg | ORAL_TABLET | ORAL | Status: DC | PRN
  Administered 2017-09-14 – 2017-09-15 (×2): 650 mg via ORAL
  Filled 2017-09-14 (×2): qty 2

## 2017-09-14 MED ORDER — ONDANSETRON HCL 4 MG/2ML IJ SOLN: 4.0000 mg | INTRAMUSCULAR | Status: DC | PRN

## 2017-09-14 MED ORDER — OXYCODONE-ACETAMINOPHEN 5-325 MG PO TABS: 2.0000 | ORAL_TABLET | ORAL | Status: DC | PRN

## 2017-09-14 MED ORDER — SOD CITRATE-CITRIC ACID 500-334 MG/5ML PO SOLN
30.0000 mL | ORAL | Status: DC | PRN
  Filled 2017-09-14: qty 15

## 2017-09-14 MED ORDER — LACTATED RINGERS IV SOLN
500.0000 mL | Freq: Once | INTRAVENOUS | Status: AC
  Administered 2017-09-14: 500 mL via INTRAVENOUS

## 2017-09-14 MED ORDER — OXYTOCIN 40 UNITS IN LACTATED RINGERS INFUSION - SIMPLE MED: 1.0000 m[IU]/min | INTRAVENOUS | Status: DC

## 2017-09-14 MED ORDER — EPHEDRINE 5 MG/ML INJ
10.0000 mg | INTRAVENOUS | Status: DC | PRN
  Filled 2017-09-14: qty 2

## 2017-09-14 MED ORDER — TETANUS-DIPHTH-ACELL PERTUSSIS 5-2.5-18.5 LF-MCG/0.5 IM SUSP: 0.5000 mL | Freq: Once | INTRAMUSCULAR | Status: DC

## 2017-09-14 MED ORDER — LIDOCAINE HCL (PF) 1 % IJ SOLN
INTRAMUSCULAR | Status: DC | PRN
  Administered 2017-09-14: 7 mL via EPIDURAL

## 2017-09-14 MED ORDER — FENTANYL 2.5 MCG/ML BUPIVACAINE 1/10 % EPIDURAL INFUSION (WH - ANES)
14.0000 mL/h | INTRAMUSCULAR | Status: DC | PRN
  Administered 2017-09-14: 14 mL/h via EPIDURAL

## 2017-09-14 MED ORDER — WITCH HAZEL-GLYCERIN EX PADS: 1.0000 "application " | MEDICATED_PAD | CUTANEOUS | Status: DC | PRN

## 2017-09-14 MED ORDER — FENTANYL 2.5 MCG/ML BUPIVACAINE 1/10 % EPIDURAL INFUSION (WH - ANES)
INTRAMUSCULAR | Status: AC
  Filled 2017-09-14: qty 100

## 2017-09-14 MED ORDER — OXYCODONE HCL 5 MG PO TABS
5.0000 mg | ORAL_TABLET | ORAL | Status: DC | PRN
  Administered 2017-09-14: 5 mg via ORAL
  Filled 2017-09-14: qty 1

## 2017-09-14 MED ORDER — BENZOCAINE-MENTHOL 20-0.5 % EX AERO
1.0000 "application " | INHALATION_SPRAY | CUTANEOUS | Status: DC | PRN
  Administered 2017-09-14: 1 via TOPICAL
  Filled 2017-09-14: qty 56

## 2017-09-14 MED ORDER — PRENATAL MULTIVITAMIN CH: 1.0000 | ORAL_TABLET | Freq: Every day | ORAL | Status: DC

## 2017-09-14 MED ORDER — ACETAMINOPHEN 325 MG PO TABS: 650.0000 mg | ORAL_TABLET | ORAL | Status: DC | PRN

## 2017-09-14 MED ORDER — FENTANYL CITRATE (PF) 100 MCG/2ML IJ SOLN
100.0000 ug | INTRAMUSCULAR | Status: DC | PRN
  Administered 2017-09-14 (×2): 100 ug via INTRAVENOUS
  Filled 2017-09-14 (×2): qty 2

## 2017-09-14 MED ORDER — SODIUM CHLORIDE 0.9 % IV BOLUS: 500.0000 mL | Freq: Once | INTRAVENOUS | Status: DC

## 2017-09-14 MED ORDER — DIPHENHYDRAMINE HCL 25 MG PO CAPS: 25.0000 mg | ORAL_CAPSULE | Freq: Four times a day (QID) | ORAL | Status: DC | PRN

## 2017-09-14 MED ORDER — OXYTOCIN 40 UNITS IN LACTATED RINGERS INFUSION - SIMPLE MED
1.0000 m[IU]/min | INTRAVENOUS | Status: DC
  Administered 2017-09-14: 2 m[IU]/min via INTRAVENOUS

## 2017-09-14 MED ORDER — ZOLPIDEM TARTRATE 5 MG PO TABS: 5.0000 mg | ORAL_TABLET | Freq: Every evening | ORAL | Status: DC | PRN

## 2017-09-14 NOTE — Progress Notes (Signed)
LABOR PROGRESS NOTE  Traci RoysSHATONA U Granlund is a 27 y.o. [phone] at 2380w0d  admitted for IOL due to mono/di twins and A2GDM.   Subjective: Patient doing well and comfortable in bed; no complaints or changes.  Objective: BP 137/82 (BP Location: Right Arm)   Pulse 95   Temp 98.5 F (36.9 C) (Oral)   Resp 16   Ht 5\' 4"  (1.626 m)   Wt 208 lb 11.2 oz (94.7 kg)   LMP 12/29/2016   BMI 35.82 kg/m  or  Vitals:   09/14/17 0415 09/14/17 0501 09/14/17 0503 09/14/17 0601  BP:  127/73  137/82  Pulse:  86  95  Resp: 18   16  Temp:   98 F (36.7 C) 98.5 F (36.9 C)  TempSrc:   Axillary Oral  Weight:      Height:         Dilation: 2.5 Effacement (%): Thick Station: -3 Presentation: Vertex Exam by:: Quinn AxeJ. Hamilton, RN FHT: Twin A: baseline: 130 bpm; variability: good (>6 bpm); accelerations: absent; decelerations: absent Twin B: baseline: 140 bpm; variability: good (>6 bpm); accelerations: absent; decelerations: absent Uterine activity: contractions present; irregular (5-6 min)  Labs: Lab Results  Component Value Date   WBC 8.8 09/14/2017   HGB 10.3 (L) 09/14/2017   HCT 31.1 (L) 09/14/2017   MCV 78.7 09/14/2017   PLT 176 09/14/2017    Patient Active Problem List   Diagnosis Date Noted  . Indication for care in labor or delivery 09/14/2017  . Gestational diabetes mellitus (GDM) in third trimester controlled on oral hypoglycemic drug 07/18/2017  . Alpha thalassemia silent carrier 04/22/2017  . GBS bacteriuria 04/13/2017  . Monochorionic diamniotic twin gestation 04/10/2017  . Ventral hernia 03/25/2017  . Supervision of high risk pregnancy, antepartum, second trimester 03/05/2017  . Hx of preeclampsia, prior pregnancy, currently pregnant, third trimester 10/21/2013    Assessment / Plan: 27 y.o. G4P3003 at 3080w0d here for IOL due to mono/di twins and A2GDM.   Labor: s/p cytotec 2x; labor progressing normally; expectant management; start pitocin if patient not progressing Fetal  Wellbeing:  Cat I Pain Control:  Planning on epidural; pain manageable for now Anticipated MOD:  SVD  Cyndee Brightlyonnor  M Jode Lippe, Medical Student 6/8/20196:42 AM

## 2017-09-14 NOTE — Anesthesia Preprocedure Evaluation (Signed)
Anesthesia Evaluation  Patient identified by MRN, date of birth, ID band Patient awake    Reviewed: Allergy & Precautions, H&P , NPO status , Patient's Chart, lab work & pertinent test results  Airway Mallampati: II  TM Distance: >3 FB Neck ROM: full    Dental no notable dental hx. (+) Teeth Intact   Pulmonary neg pulmonary ROS,    Pulmonary exam normal breath sounds clear to auscultation       Cardiovascular hypertension, negative cardio ROS Normal cardiovascular exam Rhythm:regular Rate:Normal     Neuro/Psych negative neurological ROS  negative psych ROS   GI/Hepatic negative GI ROS, Neg liver ROS,   Endo/Other  negative endocrine ROSdiabetes, Gestational  Renal/GU negative Renal ROS     Musculoskeletal   Abdominal (+) + obese,   Peds  Hematology  (+) Blood dyscrasia, anemia ,   Anesthesia Other Findings   Reproductive/Obstetrics (+) Pregnancy                             Anesthesia Physical Anesthesia Plan  ASA: II  Anesthesia Plan: Epidural   Post-op Pain Management:    Induction:   PONV Risk Score and Plan:   Airway Management Planned:   Additional Equipment:   Intra-op Plan:   Post-operative Plan:   Informed Consent: I have reviewed the patients History and Physical, chart, labs and discussed the procedure including the risks, benefits and alternatives for the proposed anesthesia with the patient or authorized representative who has indicated his/her understanding and acceptance.     Plan Discussed with:   Anesthesia Plan Comments:         Anesthesia Quick Evaluation

## 2017-09-14 NOTE — Lactation Note (Signed)
This note was copied from a baby's chart. Lactation Consultation Note  Patient Name: Boy Dante GangSHATONA Lindamood JYNWG'NToday's Date: 09/14/2017 Reason for consult: Initial assessment;Early term 37-38.6wks;Multiple gestation  P3 mother whose twins are now 5 hours old.  Mother has a 281 year old who she breastfed for 1 year.  RN in room as I entered and she informed me that the babies were having some low blood sugars.  With this in mind, I reviewed with mother the feeding plan for the night.  Mother will attempt breastfeeding and then supplement with Daron OfferGerber Goodstart immediately after breastfeeding.  The babies should take between 5-10 mls or more if they are interested.  Mother verbalized understanding.  At this point the babies are taking appropriate amounts.    Encouraged mother to continue doing a lot of STS, breast massage and hand expression before and after feedings.  Colostrum container provided to collect any EBM that mother may obtain.  She will finger feed any amounts back to the babies.  Father and 28 year old will be spending the night.  I offered to help mother throughout the night as needed.  She will call for assistance and latch help when babies awaken.  Mom made aware of O/P services, breastfeeding support groups, community resources, and our phone # for post-discharge questions.    Maternal Data Formula Feeding for Exclusion: No Has patient been taught Hand Expression?: Yes Does the patient have breastfeeding experience prior to this delivery?: Yes  Feeding Feeding Type: Bottle Fed - Formula Length of feed: 20 min  LATCH Score Latch: Repeated attempts needed to sustain latch, nipple held in mouth throughout feeding, stimulation needed to elicit sucking reflex.  Audible Swallowing: A few with stimulation  Type of Nipple: Everted at rest and after stimulation  Comfort (Breast/Nipple): Soft / non-tender  Hold (Positioning): Assistance needed to correctly position infant at breast and  maintain latch.  LATCH Score: 7  Interventions    Lactation Tools Discussed/Used     Consult Status Consult Status: Follow-up Date: 09/15/17 Follow-up type: In-patient    Dora SimsBeth R Anye Brose 09/14/2017, 10:49 PM

## 2017-09-14 NOTE — Anesthesia Pain Management Evaluation Note (Signed)
  [facility] Pain Management Visit Note  Patient: Traci RoysSHATONA U [name], 27 y.o., female  "Hello I am a [facility] the [facility]. We have an anesthesia team available at all times to provide care throughout the hospital, including epidural management and anesthesia for C-section. I don't know your plan for the delivery whether it a natural birth, water birth, IV sedation, nitrous supplementation, doula or epidural, but we want to meet your pain goals."   1.Was your pain managed to your expectations on prior hospitalizations?   Yes   2.What is your expectation for pain management during this hospitalization?     Epidural  3.How can we help you reach that goal? epidural  Record the patient's initial score and the patient's pain goal.   Pain: 4  Pain Goal: 5 [facility] wants you to be able to say your pain was always managed very well.  Traci [name] 09/14/2017

## 2017-09-14 NOTE — Progress Notes (Signed)
LABOR PROGRESS NOTE  Traci [name] is a 27 y.o. [phone] at 3815w0d  admitted for IOL for mono/di twins and A2GDM  Subjective: Pt is comfortable. No complaints.  Objective: BP 119/78   Pulse 74   Temp 98.5 F (36.9 C) (Oral)   Resp 16   Ht 5\' 4"  (1.626 m)   Wt 208 lb 11.2 oz (94.7 kg)   LMP 12/29/2016   BMI 35.82 kg/m  or  Vitals:   09/14/17 0501 09/14/17 0503 09/14/17 0601 09/14/17 0701  BP: 127/73  137/82 119/78  Pulse: 86  95 74  Resp:   16   Temp:  98 F (36.7 C) 98.5 F (36.9 C)   TempSrc:  Axillary Oral   Weight:      Height:       Dilation: 2 Effacement (%): 60 Station: -3 Presentation: Vertex Exam by:: Northwest Airlineshompson FHT:  -A: BL 140, mod var, no accel, no decel - B: BL 130, mod var, no accel, no decel Uterine activity: 3-5 min  Labs: Lab Results  Component Value Date   WBC 8.8 09/14/2017   HGB 10.3 (L) 09/14/2017   HCT 31.1 (L) 09/14/2017   MCV 78.7 09/14/2017   PLT 176 09/14/2017    Patient Active Problem List   Diagnosis Date Noted  . Indication for care in labor or delivery 09/14/2017  . Gestational diabetes mellitus (GDM) in third trimester controlled on oral hypoglycemic drug 07/18/2017  . Alpha thalassemia silent carrier 04/22/2017  . GBS bacteriuria 04/13/2017  . Monochorionic diamniotic twin gestation 04/10/2017  . Ventral hernia 03/25/2017  . Supervision of high risk pregnancy, antepartum, second trimester 03/05/2017  . Hx of preeclampsia, prior pregnancy, currently pregnant, third trimester 10/21/2013    Assessment / Plan: 27 y.o. G4P3003 at 915w0d here for  IOL for mono/di twins and A2GDM  Labor: minimal cervical change s/p cytotecx3, will start pitocin to further augment Fetal Wellbeing:  Cat I for A & B Pain Control:  Planning for epidural, supportive care now Anticipated MOD:  NSVD  Garnette GunnerAaron B Juniper Snyders, MD PGY-1 6/8/20199:09 AM

## 2017-09-14 NOTE — H&P (Addendum)
LABOR AND DELIVERY ADMISSION HISTORY AND PHYSICAL NOTE  Traci [name] is a 27 y.o. female [phone] with IUP at 226w0d by LMP presenting for IOL due to mono/di twin pregnancy and A2GDM.   She reports positive fetal movement. She denies leakage of fluid or vaginal bleeding. She reports minor contractions that are far apart in frequency. She denies current pain but is interested in an epidural closer to delivery. She reports heartburn but denies N/V, abd pain, HA, SOB, CP, dizziness, vision changes.   Prenatal History/Complications:  Patient reports GDM diagnosis during pregnancy that was controlled with metformin. She reports checking her blood sugar regularly with well-controlled blood glucose levels. [facility] in [place].   She reports a history of preeclampsia in her first pregnancy and GDM in her second and third pregnancies. All 3 prior pregnancies were IOL with vaginal delivery.  Past Medical History: Past Medical History:  Diagnosis Date  . Gestational diabetes    metformin  . Infection    UTI  . Preeclampsia   . Pregnancy induced hypertension   . Ventral hernia without obstruction or gangrene    reduced in the ER 2016    Past Surgical History: Past Surgical History:  Procedure Laterality Date  . NO PAST SURGERIES    . tooth extaction      Obstetrical History: OB History    Gravida  4   Para  3   Term  3   Preterm      AB      Living  3     SAB      TAB      Ectopic      Multiple  0   Live Births  3        Obstetric Comments  #1 high blood pressue # 2 gest diab, diet controlled #3 gest diab, on oral med        Social History: Social History   Socioeconomic History  . Marital status: Single    Spouse name: Not on file  . Number of children: Not on file  . Years of education: Not on file  . Highest education level: Not on file  Occupational History  . Not on file   Social Needs  . Financial resource strain: Not on file  . Food insecurity:    Worry: Not on file    Inability: Not on file  . Transportation needs:    Medical: Not on file    Non-medical: Not on file  Tobacco Use  . Smoking status: Never Smoker  . Smokeless tobacco: Never Used  Substance and Sexual Activity  . Alcohol use: No    Alcohol/week: 0.0 oz  . Drug use: No  . Sexual activity: Yes    Birth control/protection: None  Lifestyle  . Physical activity:    Days per week: Not on file    Minutes per session: Not on file  . Stress: Not on file  Relationships  . Social connections:    Talks on phone: Not on file    Gets together: Not on file    Attends religious service: Not on file    Active member of club or organization: Not on file    Attends meetings of clubs or organizations: Not on file    Relationship status: Not on file  Other Topics Concern  . Not on file  Social History Narrative  . Not on file  Family History: Family History  Problem Relation Age of Onset  . Diabetes Maternal Grandmother     Allergies: No Known Allergies  Medications Prior to Admission  Medication Sig Dispense Refill Last Dose  . ACCU-CHEK FASTCLIX LANCETS MISC Check blood sugars 4 times a day 100 each 2 Taking  . folic acid (FOLVITE) 1 MG tablet Take 1 tablet (1 mg total) by mouth daily. This is extra folic acid because you have twins 60 tablet 3 09/12/2017 at Unknown time  . glucose blood test strip Check blood sugars 4 times a day 100 each 12 Taking  . metFORMIN (GLUCOPHAGE) 500 MG tablet Take 1 tablet (500 mg total) by mouth at bedtime. 30 tablet 2 09/11/2017 at Unknown time  . omeprazole (PRILOSEC) 20 MG capsule Take 1 capsule (20 mg total) by mouth daily. 30 capsule 2 09/11/2017 at Unknown time  . Prenatal Multivit-Min-Fe-FA (PRENATAL VITAMINS) 0.8 MG tablet Take 1 tablet by mouth daily. 30 tablet 12 09/12/2017 at Unknown time     Review of Systems   All systems reviewed and  negative except as stated in HPI  Blood pressure 124/79, pulse 95, temperature 98.4 F (36.9 C), temperature source Oral, resp. rate 18, height 5\' 4"  (1.626 m), weight 208 lb 11.2 oz (94.7 kg), last menstrual period 12/29/2016, currently breastfeeding. General appearance: alert, cooperative, appears stated age and no distress Lungs: no respiratory distress Heart: regular rate Abdomen: soft, non-tender Extremities: No calf swelling or tenderness Presentation: vertex twin A and transverve (head RUQ) twin B Fetal monitoring: Fetal monitor 1: baseline: 130 bpm; variability: good (>6 bpm);  Accelerations: present; decelerations: absent       Fetal monitor 2: baseline: 140 bpm; variability: good (>6 bpm);  Accelerations: present; decelerations: absent Uterine activity: contractions present; irregular (8-10 min) Dilation: 2.5 Effacement (%): Thick Station: -3 Exam by:: Cyndie Mull, RN   Prenatal labs: ABO, Rh: B/Positive/-- (12/17 1615) Antibody: Negative (12/17 1615) Rubella: Immune RPR: Non Reactive (04/10 0840)  HBsAg: Negative (12/17 1615)  HIV: Non Reactive (04/10 0840)  GBS:   Positive GBS Bacturia Genetic screening:  Silent carrier for Alpha Thalassemia; otherwise normal Anatomy US: Normal  Prenatal Transfer Tool  Maternal Diabetes: Yes:  Diabetes Type:  Insulin/Medication controlled Genetic Screening: Abnormal:  Results: Other: Silent Carrier Alpha Thalassemia  Maternal Ultrasounds/Referrals: Normal  Fetal Ultrasounds or other Referrals:  Referred to Materal Fetal Medicine  Maternal Substance Abuse:  No Significant Maternal Medications:  Meds include: Other: Metformin, Prilosec Significant Maternal Lab Results: Lab values include: Group B Strep positive  No results found for this or any previous visit (from the past 24 hour(s)).  Patient Active Problem List   Diagnosis Date Noted  . Indication for care in labor or delivery 09/14/2017  . Gestational diabetes mellitus (GDM)  in third trimester controlled on oral hypoglycemic drug 07/18/2017  . Alpha thalassemia silent carrier 04/22/2017  . GBS bacteriuria 04/13/2017  . Monochorionic diamniotic twin gestation 04/10/2017  . Ventral hernia 03/25/2017  . Supervision of high risk pregnancy, antepartum, second trimester 03/05/2017  . Hx of preeclampsia, prior pregnancy, currently pregnant, third trimester 10/21/2013    Assessment: Traci [name] is a 27 y.o. G4P3003 at [redacted]w[redacted]d here for  IOL due to mono/di twin pregnancy and A2GDM.   #Labor: Begin induction with cytotec.  #Pain: Pain well-controlled; epidural closer to delivery #FWB: Twin A and B Cat I #ID:  GBS Pos; receiving penicillin G #MOF: Breast and bottle feed #MOC: BTL before d/c; papers signed #Circ:  Yes (OP) #GDM: Regular glucose monitoring. Admit glucose 96.   Cyndee Brightly, Medical Student 6/8/20191:14 AM  OB FELLOW HISTORY AND PHYSICAL ATTESTATION  I confirm that I have verified the information documented in the medical student's note and that I have also personally reperformed the physical exam and all medical decision making activities. I agree with above documentation and have made edits as needed.   Caryl Ada, DO OB Fellow 09/14/2017, 6:52 AM

## 2017-09-14 NOTE — Anesthesia Procedure Notes (Signed)
Epidural Patient location during procedure: OB Start time: 09/14/2017 11:34 AM End time: 09/14/2017 11:44 AM  Staffing Anesthesiologist: Leilani AbleHatchett, Lylie Blacklock, MD Performed: anesthesiologist   Preanesthetic Checklist Completed: patient identified, site marked, surgical consent, pre-op evaluation, timeout performed, IV checked, risks and benefits discussed and monitors and equipment checked  Epidural Patient position: sitting Prep: site prepped and draped and DuraPrep Patient monitoring: continuous pulse ox and blood pressure Approach: midline Location: L3-L4 Injection technique: LOR air  Needle:  Needle type: Tuohy  Needle gauge: 17 G Needle length: 9 cm and 9 Needle insertion depth: 7 cm Catheter type: closed end flexible Catheter size: 19 Gauge Catheter at skin depth: 12 cm Test dose: negative and Other  Assessment Sensory level: T9 Events: blood not aspirated, injection not painful, no injection resistance, negative IV test and no paresthesia  Additional Notes Reason for block:procedure for pain

## 2017-09-14 NOTE — Progress Notes (Signed)
LABOR PROGRESS NOTE  Traci [name] is a 27 y.o. [phone] at 450w0d  admitted for IOL for mono/di twins and A2GDM  Subjective: Pt is comfortable. No complaints.   Objective: BP 111/70   Pulse 76   Temp 98.3 F (36.8 C) (Axillary)   Resp 16   Ht 5\' 4"  (1.626 m)   Wt 208 lb 11.2 oz (94.7 kg)   LMP 12/29/2016   BMI 35.82 kg/m  or  Vitals:   09/14/17 1221 09/14/17 1226 09/14/17 1231 09/14/17 1236  BP: 110/74 120/81 119/82 111/70  Pulse: 83 84 80 76  Resp:      Temp:      TempSrc:      Weight:      Height:       Dilation: 2 Effacement (%): 70 Station: -3, Ballotable Presentation: Undeterminable Exam by:: Milus GlazierJennifer Hamilton RN FHT:  -A: BL 140, mod var, accel, early decel - B: BL 130, mod var, accel, variable Uterine activity: 2-5 min  Labs: Lab Results  Component Value Date   WBC 8.8 09/14/2017   HGB 10.3 (L) 09/14/2017   HCT 31.1 (L) 09/14/2017   MCV 78.7 09/14/2017   PLT 176 09/14/2017    Patient Active Problem List   Diagnosis Date Noted  . Indication for care in labor or delivery 09/14/2017  . Gestational diabetes mellitus (GDM) in third trimester controlled on oral hypoglycemic drug 07/18/2017  . Alpha thalassemia silent carrier 04/22/2017  . GBS bacteriuria 04/13/2017  . Monochorionic diamniotic twin gestation 04/10/2017  . Ventral hernia 03/25/2017  . Supervision of high risk pregnancy, antepartum, second trimester 03/05/2017  . Hx of preeclampsia, prior pregnancy, currently pregnant, third trimester 10/21/2013    Assessment / Plan: 27 y.o. G4P3003 at 7250w0d here for  IOL for mono/di twins and A2GDM  Labor: not in labor, s/p Cytotec x3, pit gtt @ 8, SROM, cont to monitor progress Fetal Wellbeing:  Cat I for A & B Pain Control:  Epidural in place Anticipated MOD:  NSVD  Garnette GunnerAaron B Thompson, MD PGY-1 6/8/201912:39 PM

## 2017-09-15 ENCOUNTER — Inpatient Hospital Stay (HOSPITAL_COMMUNITY): Payer: Medicaid Other | Admitting: Anesthesiology

## 2017-09-15 ENCOUNTER — Encounter (HOSPITAL_COMMUNITY)
Admission: RE | Disposition: A | Discharge status: Home / Self Care | Payer: Self-pay | Source: Home / Self Care | Attending: Obstetrics & Gynecology

## 2017-09-15 HISTORY — PX: TUBAL LIGATION: SHX77

## 2017-09-15 LAB — GLUCOSE, CAPILLARY: Glucose-Capillary: 110 mg/dL — ABNORMAL HIGH (ref 65–99)

## 2017-09-15 LAB — RPR: RPR Ser Ql: NONREACTIVE

## 2017-09-15 SURGERY — LIGATION, FALLOPIAN TUBE, POSTPARTUM
Anesthesia: Epidural | Laterality: Bilateral

## 2017-09-15 MED ORDER — ONDANSETRON HCL 4 MG/2ML IJ SOLN
INTRAMUSCULAR | Status: DC | PRN
  Administered 2017-09-15: 4 mg via INTRAVENOUS

## 2017-09-15 MED ORDER — COCONUT OIL OIL: 1.0000 "application " | TOPICAL_OIL | Status: DC | PRN

## 2017-09-15 MED ORDER — ACETAMINOPHEN 325 MG PO TABS
650.0000 mg | ORAL_TABLET | ORAL | Status: DC | PRN
  Administered 2017-09-15 – 2017-09-16 (×3): 650 mg via ORAL
  Filled 2017-09-15 (×3): qty 2

## 2017-09-15 MED ORDER — SENNOSIDES-DOCUSATE SODIUM 8.6-50 MG PO TABS
2.0000 | ORAL_TABLET | ORAL | Status: DC
  Administered 2017-09-15: 2 via ORAL
  Filled 2017-09-15: qty 2

## 2017-09-15 MED ORDER — ONDANSETRON HCL 4 MG/2ML IJ SOLN
INTRAMUSCULAR | Status: AC
  Filled 2017-09-15: qty 2

## 2017-09-15 MED ORDER — MIDAZOLAM HCL 5 MG/5ML IJ SOLN
INTRAMUSCULAR | Status: DC | PRN
  Administered 2017-09-15 (×2): 2 mg via INTRAVENOUS

## 2017-09-15 MED ORDER — ONDANSETRON HCL 4 MG/2ML IJ SOLN: 4.0000 mg | INTRAMUSCULAR | Status: DC | PRN

## 2017-09-15 MED ORDER — BUPIVACAINE HCL (PF) 0.25 % IJ SOLN
INTRAMUSCULAR | Status: AC
  Filled 2017-09-15: qty 10

## 2017-09-15 MED ORDER — IBUPROFEN 600 MG PO TABS
600.0000 mg | ORAL_TABLET | Freq: Four times a day (QID) | ORAL | Status: DC
  Administered 2017-09-15 – 2017-09-16 (×4): 600 mg via ORAL
  Filled 2017-09-15 (×4): qty 1

## 2017-09-15 MED ORDER — ZOLPIDEM TARTRATE 5 MG PO TABS: 5.0000 mg | ORAL_TABLET | Freq: Every evening | ORAL | Status: DC | PRN

## 2017-09-15 MED ORDER — BENZOCAINE-MENTHOL 20-0.5 % EX AERO: 1.0000 "application " | INHALATION_SPRAY | CUTANEOUS | Status: DC | PRN

## 2017-09-15 MED ORDER — ONDANSETRON HCL 4 MG PO TABS: 4.0000 mg | ORAL_TABLET | ORAL | Status: DC | PRN

## 2017-09-15 MED ORDER — MIDAZOLAM HCL 2 MG/2ML IJ SOLN
INTRAMUSCULAR | Status: AC
  Filled 2017-09-15: qty 2

## 2017-09-15 MED ORDER — SIMETHICONE 80 MG PO CHEW
80.0000 mg | CHEWABLE_TABLET | ORAL | Status: DC | PRN
  Administered 2017-09-16: 80 mg via ORAL
  Filled 2017-09-15: qty 1

## 2017-09-15 MED ORDER — PROMETHAZINE HCL 25 MG/ML IJ SOLN: 6.2500 mg | INTRAMUSCULAR | Status: DC | PRN

## 2017-09-15 MED ORDER — LIDOCAINE-EPINEPHRINE (PF) 2 %-1:200000 IJ SOLN
INTRAMUSCULAR | Status: DC | PRN
  Administered 2017-09-15: 10 mL via EPIDURAL
  Administered 2017-09-15 (×2): 5 mL via EPIDURAL

## 2017-09-15 MED ORDER — PRENATAL MULTIVITAMIN CH
1.0000 | ORAL_TABLET | Freq: Every day | ORAL | Status: DC
  Administered 2017-09-16: 1 via ORAL
  Filled 2017-09-15: qty 1

## 2017-09-15 MED ORDER — LACTATED RINGERS IV SOLN: INTRAVENOUS | Status: DC

## 2017-09-15 MED ORDER — METOCLOPRAMIDE HCL 10 MG PO TABS
10.0000 mg | ORAL_TABLET | Freq: Once | ORAL | Status: AC
  Administered 2017-09-15: 10 mg via ORAL
  Filled 2017-09-15: qty 1

## 2017-09-15 MED ORDER — BUPIVACAINE HCL (PF) 0.25 % IJ SOLN
INTRAMUSCULAR | Status: DC | PRN
  Administered 2017-09-15: 4 mL

## 2017-09-15 MED ORDER — FENTANYL CITRATE (PF) 100 MCG/2ML IJ SOLN
INTRAMUSCULAR | Status: AC
  Administered 2017-09-15: 50 ug via INTRAVENOUS
  Filled 2017-09-15: qty 2

## 2017-09-15 MED ORDER — OXYCODONE HCL 5 MG PO TABS
5.0000 mg | ORAL_TABLET | ORAL | Status: DC | PRN
  Administered 2017-09-16 (×2): 5 mg via ORAL
  Filled 2017-09-15 (×2): qty 1

## 2017-09-15 MED ORDER — LACTATED RINGERS IV SOLN
INTRAVENOUS | Status: DC | PRN
  Administered 2017-09-15: 12:00:00 via INTRAVENOUS

## 2017-09-15 MED ORDER — WITCH HAZEL-GLYCERIN EX PADS: 1.0000 "application " | MEDICATED_PAD | CUTANEOUS | Status: DC | PRN

## 2017-09-15 MED ORDER — DIBUCAINE 1 % RE OINT: 1.0000 "application " | TOPICAL_OINTMENT | RECTAL | Status: DC | PRN

## 2017-09-15 MED ORDER — TETANUS-DIPHTH-ACELL PERTUSSIS 5-2.5-18.5 LF-MCG/0.5 IM SUSP: 0.5000 mL | Freq: Once | INTRAMUSCULAR | Status: DC

## 2017-09-15 MED ORDER — FENTANYL CITRATE (PF) 100 MCG/2ML IJ SOLN
INTRAMUSCULAR | Status: DC | PRN
  Administered 2017-09-15: 100 ug via INTRAVENOUS

## 2017-09-15 MED ORDER — FENTANYL CITRATE (PF) 100 MCG/2ML IJ SOLN
INTRAMUSCULAR | Status: AC
  Filled 2017-09-15: qty 2

## 2017-09-15 MED ORDER — FAMOTIDINE 20 MG PO TABS
40.0000 mg | ORAL_TABLET | Freq: Once | ORAL | Status: AC
  Administered 2017-09-15: 40 mg via ORAL
  Filled 2017-09-15: qty 2

## 2017-09-15 MED ORDER — FENTANYL CITRATE (PF) 100 MCG/2ML IJ SOLN
25.0000 ug | INTRAMUSCULAR | Status: DC | PRN
  Administered 2017-09-15: 50 ug via INTRAVENOUS

## 2017-09-15 MED ORDER — DIPHENHYDRAMINE HCL 25 MG PO CAPS: 25.0000 mg | ORAL_CAPSULE | Freq: Four times a day (QID) | ORAL | Status: DC | PRN

## 2017-09-15 MED ORDER — OXYCODONE HCL 5 MG PO TABS
10.0000 mg | ORAL_TABLET | ORAL | Status: DC | PRN
  Administered 2017-09-15 – 2017-09-16 (×4): 10 mg via ORAL
  Filled 2017-09-15 (×4): qty 2

## 2017-09-15 MED ORDER — LIDOCAINE-EPINEPHRINE 2 %-1:200000 IJ SOLN
INTRAMUSCULAR | Status: AC
Start: 2017-09-15 — End: ?
  Filled 2017-09-15: qty 20

## 2017-09-15 SURGICAL SUPPLY — 30 items
BLADE SURG 15 STRL LF C SS BP (BLADE) ×1 IMPLANT
BLADE SURG 15 STRL SS (BLADE) ×2
CLOTH BEACON ORANGE TIMEOUT ST (SAFETY) ×3 IMPLANT
DERMABOND ADHESIVE PROPEN (GAUZE/BANDAGES/DRESSINGS) ×2
DERMABOND ADVANCED (GAUZE/BANDAGES/DRESSINGS) ×2
DERMABOND ADVANCED .7 DNX12 (GAUZE/BANDAGES/DRESSINGS) ×1 IMPLANT
DERMABOND ADVANCED .7 DNX6 (GAUZE/BANDAGES/DRESSINGS) ×1 IMPLANT
DRSG OPSITE POSTOP 3X4 (GAUZE/BANDAGES/DRESSINGS) ×3 IMPLANT
ELECT REM PT RETURN 9FT ADLT (ELECTROSURGICAL)
ELECTRODE REM PT RTRN 9FT ADLT (ELECTROSURGICAL) IMPLANT
GLOVE BIOGEL PI IND STRL 7.0 (GLOVE) ×1 IMPLANT
GLOVE BIOGEL PI IND STRL 8 (GLOVE) ×1 IMPLANT
GLOVE BIOGEL PI INDICATOR 7.0 (GLOVE) ×2
GLOVE BIOGEL PI INDICATOR 8 (GLOVE) ×2
GLOVE ECLIPSE 8.0 STRL XLNG CF (GLOVE) ×3 IMPLANT
GOWN STRL REUS W/TWL LRG LVL3 (GOWN DISPOSABLE) ×6 IMPLANT
NEEDLE HYPO 22GX1.5 SAFETY (NEEDLE) IMPLANT
NS IRRIG 1000ML POUR BTL (IV SOLUTION) ×3 IMPLANT
PACK ABDOMINAL MINOR (CUSTOM PROCEDURE TRAY) ×3 IMPLANT
PENCIL BUTTON HOLSTER BLD 10FT (ELECTRODE) IMPLANT
PROTECTOR NERVE ULNAR (MISCELLANEOUS) ×3 IMPLANT
SPONGE LAP 4X18 X RAY DECT (DISPOSABLE) ×3 IMPLANT
SUT PLAIN 2 0 (SUTURE) ×4
SUT PLAIN ABS 2-0 CT1 27XMFL (SUTURE) ×2 IMPLANT
SUT VIC AB 0 CT1 27 (SUTURE) ×2
SUT VIC AB 0 CT1 27XBRD ANBCTR (SUTURE) ×1 IMPLANT
SUT VIC AB 4-0 KS 27 (SUTURE) ×3 IMPLANT
SYR CONTROL 10ML LL (SYRINGE) IMPLANT
TOWEL OR 17X24 6PK STRL BLUE (TOWEL DISPOSABLE) ×6 IMPLANT
TRAY FOLEY CATH SILVER 14FR (SET/KITS/TRAYS/PACK) ×3 IMPLANT

## 2017-09-15 NOTE — Anesthesia Postprocedure Evaluation (Signed)
Anesthesia Post Note  Patient: Traci [name]  Procedure(s) Performed: AN AD HOC LABOR EPIDURAL     Patient location during evaluation: Mother Baby Anesthesia Type: Epidural Level of consciousness: awake and alert Pain management: pain level controlled Vital Signs Assessment: post-procedure vital signs reviewed and stable Respiratory status: spontaneous breathing, nonlabored ventilation and respiratory function stable Cardiovascular status: stable Postop Assessment: no headache, no backache, epidural receding and patient able to bend at knees Anesthetic complications: no    Last Vitals:  Vitals:   09/15/17 0619 09/15/17 0758  BP: 112/67 114/72  Pulse: 61 68  Resp: 18 18  Temp: 36.8 C 36.5 C  SpO2:      Last Pain:  Vitals:   09/15/17 0619  TempSrc: Oral  PainSc: 0-No pain   Pain Goal:                 Rica RecordsICKELTON,[name]

## 2017-09-15 NOTE — Anesthesia Postprocedure Evaluation (Signed)
Anesthesia Post Note  Patient: Traci RoysSHATONA U [name]  Procedure(s) Performed: POST PARTUM TUBAL LIGATION (Bilateral )     Patient location during evaluation: PACU Anesthesia Type: Epidural Level of consciousness: awake, awake and alert and oriented Pain management: pain level controlled Vital Signs Assessment: post-procedure vital signs reviewed and stable Respiratory status: spontaneous breathing, nonlabored ventilation and respiratory function stable Cardiovascular status: stable Postop Assessment: no headache, no backache, epidural receding, patient able to bend at knees and no signs of nausea or vomiting Anesthetic complications: no    Last Vitals:  Vitals:   09/15/17 1300 09/15/17 1301  BP: 135/78   Pulse: 87 86  Resp: 17 18  Temp:    SpO2: 95% 95%    Last Pain:  Vitals:   09/15/17 1300  TempSrc:   PainSc: Asleep   Pain Goal:                 Cecile HearingStephen Edward Turk

## 2017-09-15 NOTE — Lactation Note (Signed)
This note was copied from a baby's chart. Lactation Consultation Note  Patient Name: Traci Dante GangSHATONA Hing [name]'UToday's Date: 09/15/2017 Reason for consult: Follow-up assessment  23 hours old early term twin who is now being partially BF and formula fed by his mother. Baby is now on Similac 24 calorie formula due to low sugar and 13% weight loss. Mom hasn't pumped today, she voiced she was on NPO all day and she just started eating again. Explained to mom the importance of consistent pumping, asked mom to pump at least twice today and start doing it every 3 hours tomorrow if not sooner.  RN called for latch assistance, mom already had baby at the breast when entering the room, baby was nursing at the right breast with a wide open mouth and flanged lips. RN did latch score. Baby kept falling asleep at the breast during consult, had to arouse baby to keep him from falling back to sleep.  Encouraged mom to keep nursing 8-12 times/24 hours or sooner if feeding cues are present. Mom will supplement with Similac 24 calorie formula afterwards according to formula guidelines or ad lib per MD instructions. She'll start pumping again and give any amount of EBM to her babies. She's aware of LC services and will call PRN.  Maternal Data    Feeding Feeding Type: Bottle Fed - Formula Length of feed: 15 min  LATCH Score Latch: Grasps breast easily, tongue down, lips flanged, rhythmical sucking.  Audible Swallowing: A few with stimulation  Type of Nipple: Everted at rest and after stimulation  Comfort (Breast/Nipple): Soft / non-tender  Hold (Positioning): Assistance needed to correctly position infant at breast and maintain latch.  LATCH Score: 8  Interventions Interventions: Breast feeding basics reviewed;Skin to skin;Breast massage;Breast compression;Adjust position;Support pillows;DEBP  Lactation Tools Discussed/Used     Consult Status Consult Status: Follow-up Date: 09/16/17 Follow-up type:  In-patient    Leesa Leifheit Venetia ConstableS Garnet Overfield 09/15/2017, 4:48 PM

## 2017-09-15 NOTE — Op Note (Signed)
  Preoperative diagnosis:  Multiparous female who desires permanent sterilization  Postoperative diagnosis:  Same as above  Procedure:  Postpartum partum bilateral tubal ligation using modified Pomeroy technique  Surgeon:  Lazaro ArmsEURE,Dayshaun Whobrey H  Asst.:    Anesthesia:  epidural  Findings:  Patient had a normal postpartum uterus tubes and ovaries.  Description of operation:  Patient was taken to the operating room where she had her epidural dosed.  She was then placed in the supine position.  She was then prepped and draped in the usual sterile fashion and a Foley catheter was placed in the bladder after the spinal was dosed.  A semilunar incision was made just below the umbilicus and taken down sharply to the fascia which was incised.  The peritoneum was then entered manually.  The right fallopian tube was identified including the fimbriated end.  The right fallopian tube was grasped and a 2-1/2 cm segment was removed using the modified Pomeroy technique.  There was good hemostasis.  Attention was then turned to the left fallopian tube which was identified including the fimbriated end.  A 2-1/2 cm segment in the distal isthmic and ampullary region portion of the tube was removed again using a modified Pomeroy technique.  There was good hemostasis.    The subcutaneous tissue fascia and peritoneum were closed using 0 vicryl in a running fashion.   The skin was closed using 4-0 Vicryl on a Keith needle in a subcuticular fashion.  Dermabond was placed for additional wound integrity and also to serve as a bandage.  The patient was taken to the recovery room in good stable condition.  All counts were correct.  Blood loss was minimal.    Lazaro ArmsLuther H Hillman Attig, MD 09/15/2017 12:48 PM

## 2017-09-15 NOTE — Transfer of Care (Signed)
Immediate Anesthesia Transfer of Care Note  Patient: Traci RoysSHATONA U Veazie  Procedure(s) Performed: POST PARTUM TUBAL LIGATION (Bilateral )  Patient Location: PACU  Anesthesia Type:Epidural  Level of Consciousness: awake, alert  and oriented  Airway & Oxygen Therapy: Patient Spontanous Breathing  Post-op Assessment: Report given to RN and Post -op Vital signs reviewed and stable  Post vital signs: Reviewed and stable  Last Vitals:  Vitals Value Taken Time  BP    Temp    Pulse    Resp    SpO2      Last Pain:  Vitals:   09/15/17 1123  TempSrc:   PainSc: 2          Complications: No apparent anesthesia complications

## 2017-09-15 NOTE — Lactation Note (Signed)
This note was copied from a baby's chart. Lactation Consultation Note  Patient Name: Traci Dante GangSHATONA Henriques WUJWJ'XToday's Date: 09/15/2017   [facility]  I visited with mother and spoke with her about beginning pumping with the DEBP to increase milk supply for the twins.  Mother would like to begin pumping.  Set up the DEBP and provided instructions for set up, assembly and cleaning of pump parts.    Mother will continue to breastfeed and supplement with formula, adding breast milk as it becomes available.  Colostrum container provided for any EBM she may obtain.  I encouraged continued STS, breast massage and hand expression after pumping.  Mother verbalized understanding of plan and will call for assistance as needed.  Family present.                 Traci [name] 09/15/2017, 4:42 AM

## 2017-09-15 NOTE — Progress Notes (Signed)
POSTPARTUM PROGRESS NOTE  Post Partum Day 1 Subjective:  Valerie RoysSHATONA U Depaz is a 27 y.o. [id number] 654w0d s/p SVD after IOL for mono/di twins and GDM.  No acute events overnight.  Pt denies problems with ambulating, voiding or po intake. Pt has been NPO since midnight for BTL. She denies nausea or vomiting.  Pain is moderately controlled.  She has had flatus. She has not had bowel movement.  Lochia Minimal. Pt reports a HA yesterday; pt denies vision changes, dizziness, CP, SOB, abd pain.  Objective: Blood pressure 112/67, pulse 61, temperature 98.3 F (36.8 C), temperature source Oral, resp. rate 18, height 5\' 4"  (1.626 m), weight 208 lb 11.2 oz (94.7 kg), last menstrual period 12/29/2016, SpO2 99 %, unknown if currently breastfeeding.  Physical Exam:  General: alert, cooperative and no distress Lochia:normal flow Chest: CTAB Heart: RRR no m/r/g Abdomen: +BS, soft, nontender,  Uterine Fundus: firm, non-tender DVT Evaluation: No calf swelling or tenderness Extremities: no evidence of LE edema  Recent Labs    09/14/17 0035  HGB 10.3*  HCT 31.1*    Assessment/Plan:  ASSESSMENT: Valerie RoysSHATONA U Jani is a 27 y.o. [id number] 244w0d s/p SVD after IOL for mono/di twins and GDM.  Plan for discharge tomorrow Breastfeeding and bottle feeding: babies' blood sugars were low initally, but have normalized Circumcision: outpatient  Contraception: BTL scheduled for today GDM: Recent blood sugars all wnl   LOS: 1 day   Cyndee Brightlyonnor  M Rohini Jaroszewski, Medical Student 09/15/2017, 7:35 AM

## 2017-09-15 NOTE — Anesthesia Preprocedure Evaluation (Signed)
Anesthesia Evaluation  Patient identified by MRN, date of birth, ID band Patient awake    Reviewed: Allergy & Precautions, NPO status , Patient's Chart, lab work & pertinent test results  Airway Mallampati: II  TM Distance: >3 FB Neck ROM: Full    Dental  (+) Teeth Intact, Dental Advisory Given   Pulmonary neg pulmonary ROS,    Pulmonary exam normal breath sounds clear to auscultation       Cardiovascular hypertension, Normal cardiovascular exam Rhythm:Regular Rate:Normal     Neuro/Psych negative neurological ROS  negative psych ROS   GI/Hepatic negative GI ROS, Neg liver ROS,   Endo/Other  diabetes, Gestational, Oral Hypoglycemic AgentsObesity   Renal/GU negative Renal ROS     Musculoskeletal negative musculoskeletal ROS (+)   Abdominal   Peds  Hematology negative hematology ROS (+) Blood dyscrasia, anemia , Plt 176k on 09/14/17   Anesthesia Other Findings Day of surgery medications reviewed with the patient.  Reproductive/Obstetrics S/p SVD on 09/14/17                             Anesthesia Physical Anesthesia Plan  ASA: II  Anesthesia Plan: Epidural   Post-op Pain Management:    Induction:   PONV Risk Score and Plan: 3 and Midazolam, Ondansetron and Treatment may vary due to age or medical condition  Airway Management Planned:   Additional Equipment:   Intra-op Plan:   Post-operative Plan:   Informed Consent: I have reviewed the patients History and Physical, chart, labs and discussed the procedure including the risks, benefits and alternatives for the proposed anesthesia with the patient or authorized representative who has indicated his/her understanding and acceptance.   Dental advisory given  Plan Discussed with:   Anesthesia Plan Comments: (Epidural in situ.  Will attempt to dose epidural for surgical anesthesia.  Backup spinal anesthesia if needed. )         Anesthesia Quick Evaluation

## 2017-09-15 NOTE — Lactation Note (Signed)
This note was copied from a baby's chart. Lactation Consultation Note: Mother return from her BTL. Baby A in the nursery. Mother reports that she is having pain. [facility] feeding for latch assistance. Mother has been pumping and last pumped 20 ml . Mother reports that when Baby A latches she observed swallows and denies having any discomfort with latch. Mother reports that she was told that infant has a tongue tie.  Advised mother to continue to pump every 2-3 hours. Mother reports that she is active with WIC. She doesn't have an electric pump at home. [facility] and mother reports that she has a hand pump and is used to using a hand pump. Mother has 3 small children at home. I advised to think about getting a pump due to infant in NICU and she would need to protect her milk supply. Mother receptive to teaching.   Patient Name: Traci [name] Enright ZOXWR'UToday's Date: 09/15/2017 Reason for consult: Follow-up assessment   Maternal Data    Feeding Feeding Type: Formula Nipple Type: Slow - flow  LATCH Score                   Interventions    Lactation Tools Discussed/Used     Consult Status Consult Status: Follow-up Date: 09/15/17 Follow-up type: In-patient    Stevan BornKendrick, Zharia Conrow [facility] 09/15/2017, 3:41 PM

## 2017-09-16 ENCOUNTER — Encounter (HOSPITAL_COMMUNITY): Payer: Self-pay

## 2017-09-16 ENCOUNTER — Encounter (HOSPITAL_COMMUNITY): Payer: Self-pay | Admitting: Obstetrics & Gynecology

## 2017-09-16 LAB — GLUCOSE, CAPILLARY: Glucose-Capillary: 92 mg/dL (ref 65–99)

## 2017-09-16 MED ORDER — IBUPROFEN 600 MG PO TABS: 600.0000 mg | ORAL_TABLET | Freq: Four times a day (QID) | ORAL | 0 refills | Status: DC

## 2017-09-16 MED ORDER — OXYCODONE-ACETAMINOPHEN 5-325 MG PO TABS: 1.0000 | ORAL_TABLET | ORAL | 0 refills | Status: DC | PRN

## 2017-09-16 NOTE — Lactation Note (Signed)
This note was copied from a baby's chart. Lactation Consultation Note  Patient Name: Traci Sydell Piche ZOXWDante GangR'UToday's Date: [name] Reason for consult: Follow-up assessment;NICU baby;Multiple gestation;Infant weight loss Baby A is with mom and baby B is in the NICU.  She states she is putting A to breast with cues and he is doing well.  Baby B has not been to breast yet.  She will ask if he can breastfeed today and call if she needs assist.  Stressed importance of pumping both breasts every 3 hours.  She is obtaining 10-15 mls.  Instructed to divide amount pumped for each baby.  Reviewed pump operation with mom.  Baby is at a 14 % weight loss but receiving ad lib supplements and tolerating well.  There is a question about the accuracy of the birth weight recorded.    Maternal Data    Feeding Feeding Type: Breast Fed Nipple Type: Slow - flow  LATCH Score Latch: Grasps breast easily, tongue down, lips flanged, rhythmical sucking.  Audible Swallowing: A few with stimulation  Type of Nipple: Everted at rest and after stimulation  Comfort (Breast/Nipple): Soft / non-tender  Hold (Positioning): Assistance needed to correctly position infant at breast and maintain latch.  LATCH Score: 8  Interventions    Lactation Tools Discussed/Used     Consult Status Consult Status: Follow-up Date: 09/17/17 Follow-up type: In-patient    Huston FoleyMOULDEN, Le Ferraz S [name], 9:39 AM

## 2017-09-16 NOTE — Discharge Summary (Addendum)
OB Discharge Summary     Patient Name: Traci [name] DOB: [birth year] MRN: [id number]  Date of admission: 09/14/2017 Delivering MD:    Traci [name], Traci [name] [[id number]][[id number]]  Traci [name]   [name] [[id number]][[id number]]  Traci [name]   Date of discharge: 09/16/2017  Admitting diagnosis: 37 wk induction Intrauterine pregnancy: [id number]     Secondary diagnosis:  Active Problems:   Indication for care in labor or delivery  Additional problems: GDMA2     Discharge diagnosis: GDM A2                                                                                                Post partum procedures:postpartum tubal ligation  Augmentation: Pitocin, Cytotec and AROM of Baby [name]  Complications: [name]  Hospital course:  Induction of Labor With Spontaneous Vaginal Delivery of Twin  27 y.o. yo G4P4005 at 71104w0d was admitted to the hospital 09/14/2017 for induction of labor.  Indication for induction: A2 DM and Mono/Di-twins.  Patient had an labor course as follows: Baby [name] required Vacuum Assistance of Delivery for variable decels. Membrane Rupture Time/Date:    Traci [name], Traci [facility] Traci [name] [[id number]][[id number]]  Traci AM   Traci [name] Traci [name] [[id number]][[id number]]  Traci AM  Lacerations: [name] Patient had delivery of a Viable twins.    Details of delivery can be found in separate delivery note.  Patient had a routine postpartum course. Patient is discharged home 09/16/17. Blood sugars were stable off of Metformin. Will D/C and perform PP 2 hour GTT.  Physical exam  Vitals:   09/15/17 1900 09/15/17 2328 09/16/17 0333 09/16/17 0545  BP: 126/72 124/62 121/71 119/68  Pulse: 69 70 68 67  Resp: 18 19 16 16   Temp: 98.5 F (36.9 C) 98.6 F (37 C) 98.6 F (37 C) 98.1 F (36.7 C)  TempSrc:   Oral Oral  SpO2:      Weight:      Height:       General: alert, cooperative and no distress Lochia: appropriate Uterine Fundus: firm Incision: Healing well with no significant drainage, No significant erythema,  Dressing is clean, dry, and intact DVT Evaluation: No evidence of DVT seen on physical exam. Labs: Lab Results  Component Value Date   WBC 8.8 09/14/2017   HGB 10.3 (L) 09/14/2017   HCT 31.1 (L) 09/14/2017   MCV 78.7 09/14/2017   PLT 176 09/14/2017   CMP Latest Ref Rng & Units 05/08/2017  Glucose 65 - 99 mg/dL 92  BUN 6 - 20 mg/dL 5(L)  Creatinine 4.400.57 - 1.00 mg/dL 3.470.57  Sodium 425134 - 956144 mmol/L 137  Potassium 3.5 - 5.2 mmol/L 3.7  Chloride 96 - 106 mmol/L 101  CO2 20 - 29 mmol/L 21  Calcium 8.7 - 10.2 mg/dL 8.7  Total Protein 6.0 - 8.5 g/dL 6.3  Total Bilirubin 0.0 - 1.2 mg/dL <3.8<0.2  Alkaline Phos 39 - 117 IU/L 57  AST 0 - 40 IU/L 20  ALT 0 - 32 IU/L 22    Discharge instruction: per After Visit Summary and "Baby and Me Booklet".  After visit  meds:  Allergies as of 09/16/2017   No Known Allergies     Medication List    STOP taking these medications   ACCU-CHEK FASTCLIX LANCETS Misc   glucose blood test strip   metFORMIN 500 MG tablet Commonly known as:  GLUCOPHAGE     TAKE these medications   folic acid 1 MG tablet Commonly known as:  FOLVITE Take 1 tablet (1 mg total) by mouth daily. This is extra folic acid because you have twins   ibuprofen 600 MG tablet Commonly known as:  ADVIL,MOTRIN Take 1 tablet (600 mg total) by mouth every 6 (six) hours.   omeprazole 20 MG capsule Commonly known as:  PRILOSEC Take 1 capsule (20 mg total) by mouth daily.   Prenatal Vitamins 0.8 MG tablet Take 1 tablet by mouth daily.       Diet: routine diet  Activity: Advance as tolerated. Pelvic rest for 6 weeks.   Outpatient follow up:2 weeks incisin check, 4 week PP check, 6 week for GDM Follow up Appt:No future appointments. Follow up Visit:No follow-ups on file.  Postpartum contraception: Tubal Ligation  Newborn Data:   Traci [name] [[id number]]  Live born female  Birth Weight: 6 lb 7.6 oz (2938 g) APGAR: 8, 9  Newborn Delivery   Birth date/time:   09/14/2017 16:50:00 Delivery type:  Vaginal, Spontaneous      Traci [name] [[id number]]  Live born female  Birth Weight: 5 lb 10.7 oz (2570 g) APGAR: 7, 8  Newborn Delivery   Birth date/time:  09/14/2017 17:33:00 Delivery type:  Vaginal, Spontaneous     Baby Feeding: Bottle and Breast Disposition:Baby A home with Mom, Baby [name] in NICU   09/16/2017 Garnette Gunner, MD  I was present for the exam and agree with above. Baby A will not be D/C'd. Pt will room in. Percocet and IBU Rx sent to pharmacy.   Katrinka Blazing, [place], CNM 09/17/2017 3:12 PM

## 2017-09-16 NOTE — Discharge Instructions (Signed)
Vaginal Delivery, Care After °Refer to this sheet in the next few weeks. These instructions provide you with information about caring for yourself after vaginal delivery. Your health care provider may also give you more specific instructions. Your treatment has been planned according to current medical practices, but problems sometimes occur. Call your health care provider if you have any problems or questions. °What can I expect after the procedure? °After vaginal delivery, it is common to have: °· Some bleeding from your vagina. °· Soreness in your abdomen, your vagina, and the area of skin between your vaginal opening and your anus (perineum). °· Pelvic cramps. °· Fatigue. ° °Follow these instructions at home: °Medicines °· Take over-the-counter and prescription medicines only as told by your health care provider. °· If you were prescribed an antibiotic medicine, take it as told by your health care provider. Do not stop taking the antibiotic until it is finished. °Driving ° °· Do not drive or operate heavy machinery while taking prescription pain medicine. °· Do not drive for 24 hours if you received a sedative. °Lifestyle °· Do not drink alcohol. This is especially important if you are breastfeeding or taking medicine to relieve pain. °· Do not use tobacco products, including cigarettes, chewing tobacco, or e-cigarettes. If you need help quitting, ask your health care provider. °Eating and drinking °· Drink at least 8 eight-ounce glasses of water every day unless you are told not to by your health care provider. If you choose to breastfeed your baby, you may need to drink more water than this. °· Eat high-fiber foods every day. These foods may help prevent or relieve constipation. High-fiber foods include: °? Whole grain cereals and breads. °? Brown rice. °? Beans. °? Fresh fruits and vegetables. °Activity °· Return to your normal activities as told by your health care provider. Ask your health care provider  what activities are safe for you. °· Rest as much as possible. Try to rest or take a nap when your baby is sleeping. °· Do not lift anything that is heavier than your baby or 10 lb (4.5 kg) until your health care provider says that it is safe. °· Talk with your health care provider about when you can engage in sexual activity. This may depend on your: °? Risk of infection. °? Rate of healing. °? Comfort and desire to engage in sexual activity. °Vaginal Care °· If you have an episiotomy or a vaginal tear, check the area every day for signs of infection. Check for: °? More redness, swelling, or pain. °? More fluid or blood. °? Warmth. °? Pus or a bad smell. °· Do not use tampons or douches until your health care provider says this is safe. °· Watch for any blood clots that may pass from your vagina. These may look like clumps of dark red, brown, or black discharge. °General instructions °· Keep your perineum clean and dry as told by your health care provider. °· Wear loose, comfortable clothing. °· Wipe from front to back when you use the toilet. °· Ask your health care provider if you can shower or take a bath. If you had an episiotomy or a perineal tear during labor and delivery, your health care provider may tell you not to take baths for a certain length of time. °· Wear a bra that supports your breasts and fits you well. °· If possible, have someone help you with household activities and help care for your baby for at least a few days after   you leave the hospital. °· Keep all follow-up visits for you and your baby as told by your health care provider. This is important. °Contact a health care provider if: °· You have: °? Vaginal discharge that has a bad smell. °? Difficulty urinating. °? Pain when urinating. °? A sudden increase or decrease in the frequency of your bowel movements. °? More redness, swelling, or pain around your episiotomy or vaginal tear. °? More fluid or blood coming from your episiotomy or  vaginal tear. °? Pus or a bad smell coming from your episiotomy or vaginal tear. °? A fever. °? A rash. °? Little or no interest in activities you used to enjoy. °? Questions about caring for yourself or your baby. °· Your episiotomy or vaginal tear feels warm to the touch. °· Your episiotomy or vaginal tear is separating or does not appear to be healing. °· Your breasts are painful, hard, or turn red. °· You feel unusually sad or worried. °· You feel nauseous or you vomit. °· You pass large blood clots from your vagina. If you pass a blood clot from your vagina, save it to show to your health care provider. Do not flush blood clots down the toilet without having your health care provider look at them. °· You urinate more than usual. °· You are dizzy or light-headed. °· You have not breastfed at all and you have not had a menstrual period for 12 weeks after delivery. °· You have stopped breastfeeding and you have not had a menstrual period for 12 weeks after you stopped breastfeeding. °Get help right away if: °· You have: °? Pain that does not go away or does not get better with medicine. °? Chest pain. °? Difficulty breathing. °? Blurred vision or spots in your vision. °? Thoughts about hurting yourself or your baby. °· You develop pain in your abdomen or in one of your legs. °· You develop a severe headache. °· You faint. °· You bleed from your vagina so much that you fill two sanitary pads in one hour. °This information is not intended to replace advice given to you by your health care provider. Make sure you discuss any questions you have with your health care provider. °Document Released: 03/23/2000 Document Revised: 09/07/2015 Document Reviewed: 04/10/2015 °Elsevier Interactive Patient Education © 2018 Elsevier Inc. ° ° °Postpartum Tubal Ligation, Care After °Refer to this sheet in the next few weeks. These instructions provide you with information about caring for yourself after your procedure. Your health  care provider may also give you more specific instructions. Your treatment has been planned according to current medical practices, but problems sometimes occur. Call your health care provider if you have any problems or questions after your procedure. °What can I expect after the procedure? °After the procedure, it is common to have: °· A sore throat. °· Bruising or pain in your back. °· Nausea or vomiting. °· Dizziness. °· Mild abdominal discomfort or pain, such as cramping, gas pain, or feeling bloated. °· Soreness where the incision was made. °· Tiredness. °· Pain in your shoulders. ° °Follow these instructions at home: °Medicines °· Take over-the-counter and prescription medicines only as told by your health care provider. °· Do not take aspirin because it can cause bleeding. °· Do not drive or operate heavy machinery while taking prescription pain medicine. °Activity °· Rest for the rest of the day. °· Gradually return to your normal activities over the next few days. °· Do not have sex, douche,   or put a tampon or anything else in your vagina for 6 weeks or as long as told by your health care provider.  Do not lift anything that is heavier than your baby for 2 weeks or as long as told by your health care provider. Incision care  Follow instructions from your health care provider about how to take care of your incision. Make sure you: ? Wash your hands with soap and water before you change your bandage (dressing). If soap and water are not available, use hand sanitizer. ? Change your dressing as told by your health care provider. ? Leave stitches (sutures) in place. They may need to stay in place for 2 weeks or longer.  Check your incision area every day for signs of infection. Check for: ? More redness, swelling, or pain. ? More fluid or blood. ? Warmth. ? Pus or a bad smell. Other Instructions  Do not take baths, swim, or use a hot tub until your health care provider approves. You may take  showers.  Keep all follow-up visits as told by your health care provider. This is important. Contact a health care provider if:  You have more redness, swelling, or pain around your incision.  Your incision feels warm to the touch.  You have pus or a bad smell coming from your incision.  The edges of your incision break open after the sutures have been removed.  Your pain does not improve after 2-3 days.  You have a rash.  You repeatedly become dizzy or lightheaded.  Your pain medicine is not helping.  You are constipated. Get help right away if:  You have a fever.  You faint.  You have pain in your abdomen that gets worse.  You have fluid or blood coming from your sutures.  You have shortness of breath or difficulty breathing.  You have chest pain or leg pain.  You have ongoing nausea or diarrhea. This information is not intended to replace advice given to you by your health care provider. Make sure you discuss any questions you have with your health care provider. Document Released: 09/25/2011 Document Revised: 08/29/2015 Document Reviewed: 03/06/2015 Elsevier Interactive Patient Education  2018 Elsevier Inc.   Postpartum Tubal Ligation, Care After Refer to this sheet in the next few weeks. These instructions provide you with information about caring for yourself after your procedure. Your health care provider may also give you more specific instructions. Your treatment has been planned according to current medical practices, but problems sometimes occur. Call your health care provider if you have any problems or questions after your procedure. What can I expect after the procedure? After the procedure, it is common to have:  A sore throat.  Bruising or pain in your back.  Nausea or vomiting.  Dizziness.  Mild abdominal discomfort or pain, such as cramping, gas pain, or feeling bloated.  Soreness where the incision was made.  Tiredness.  Pain in your  shoulders.  Follow these instructions at home: Medicines  Take over-the-counter and prescription medicines only as told by your health care provider.  Do not take aspirin because it can cause bleeding.  Do not drive or operate heavy machinery while taking prescription pain medicine. Activity  Rest for the rest of the day.  Gradually return to your normal activities over the next few days.  Do not have sex, douche, or put a tampon or anything else in your vagina for 6 weeks or as long as told by your health  care provider.  Do not lift anything that is heavier than your baby for 2 weeks or as long as told by your health care provider. Incision care  Follow instructions from your health care provider about how to take care of your incision. Make sure you: ? Wash your hands with soap and water before you change your bandage (dressing). If soap and water are not available, use hand sanitizer. ? Change your dressing as told by your health care provider. ? Leave stitches (sutures) in place. They may need to stay in place for 2 weeks or longer.  Check your incision area every day for signs of infection. Check for: ? More redness, swelling, or pain. ? More fluid or blood. ? Warmth. ? Pus or a bad smell. Other Instructions  Do not take baths, swim, or use a hot tub until your health care provider approves. You may take showers.  Keep all follow-up visits as told by your health care provider. This is important. Contact a health care provider if:  You have more redness, swelling, or pain around your incision.  Your incision feels warm to the touch.  You have pus or a bad smell coming from your incision.  The edges of your incision break open after the sutures have been removed.  Your pain does not improve after 2-3 days.  You have a rash.  You repeatedly become dizzy or lightheaded.  Your pain medicine is not helping.  You are constipated. Get help right away if:  You  have a fever.  You faint.  You have pain in your abdomen that gets worse.  You have fluid or blood coming from your sutures.  You have shortness of breath or difficulty breathing.  You have chest pain or leg pain.  You have ongoing nausea or diarrhea. This information is not intended to replace advice given to you by your health care provider. Make sure you discuss any questions you have with your health care provider. Document Released: 09/25/2011 Document Revised: 08/29/2015 Document Reviewed: 03/06/2015 Elsevier Interactive Patient Education  [employer].

## 2017-09-16 NOTE — Addendum Note (Signed)
Addendum  created 09/16/17 0830 by Elgie CongoMalinova, Aydrian Halpin H, CRNA   Sign clinical note

## 2017-09-16 NOTE — Anesthesia Postprocedure Evaluation (Signed)
Anesthesia Post Note  Patient: Traci [name]  Procedure(s) Performed: POST PARTUM TUBAL LIGATION (Bilateral )     Patient location during evaluation: Mother Baby Anesthesia Type: Epidural Level of consciousness: awake and alert Pain management: pain level controlled Vital Signs Assessment: post-procedure vital signs reviewed and stable Respiratory status: spontaneous breathing, nonlabored ventilation and respiratory function stable Cardiovascular status: stable Postop Assessment: no headache, no backache, epidural receding, no apparent nausea or vomiting, patient able to bend at knees, able to ambulate and adequate PO intake Anesthetic complications: no    Last Vitals:  Vitals:   09/16/17 0333 09/16/17 0545  BP: 121/71 119/68  Pulse: 68 67  Resp: 16 16  Temp: 37 C 36.7 C  SpO2:      Last Pain:  Vitals:   09/16/17 0700  TempSrc:   PainSc: 3    Pain Goal: Patients Stated Pain Goal: 4 (09/15/17 1330)               [name]

## 2017-09-17 ENCOUNTER — Inpatient Hospital Stay (HOSPITAL_COMMUNITY): Admission: RE | Admit: 2017-09-17 | Payer: Medicaid Other | Source: Ambulatory Visit

## 2017-09-18 LAB — TYPE AND SCREEN
ABO/RH(D): B POS
Antibody Screen: POSITIVE
DONOR AG TYPE: NEGATIVE
Donor AG Type: NEGATIVE
PT AG Type: NEGATIVE
UNIT DIVISION: 0
UNIT DIVISION: 0

## 2017-09-18 LAB — BPAM RBC
BLOOD PRODUCT EXPIRATION DATE: 201907042359
Blood Product Expiration Date: 201907072359
UNIT TYPE AND RH: 5100
Unit Type and Rh: 5100

## 2017-10-14 NOTE — Progress Notes (Deleted)
Post Partum Exam  Traci [name] is a 27 y.o. 464P4005 female who presents for a postpartum visit. She is {1-10:13787} {time; units:18646} postpartum following a {delivery:12449}. I have fully reviewed the prenatal and intrapartum course. The delivery was at *** gestational weeks.  Anesthesia: {anesthesia types:812}. Postpartum course has been ***. Baby's course has been ***. Baby is feeding by {breast/bottle:69}. Bleeding {vag bleed:12292}. Bowel function is {normal:32111}. Bladder function is {normal:32111}. Patient {is/is not:9024} sexually active. Contraception method is {contraceptive method:5051}. Postpartum depression screening:neg  {Common ambulatory SmartLinks:19316} Last pap smear done *** and was {Desc; normal/abnormal:11317::"Normal"}  Review of Systems {ros; complete:30496}    Objective:  Last menstrual period 12/29/2016, unknown if currently breastfeeding.  General:  {gen appearance:16600}   Breasts:  {breast exam:1202::"inspection negative, no nipple discharge or bleeding, no masses or nodularity palpable"}  Lungs: {lung exam:16931}  Heart:  {heart exam:5510}  Abdomen: {abdomen exam:16834}   Vulva:  {labia exam:12198}  Vagina: {vagina exam:12200}  Cervix:  {cervix exam:14595}  Corpus: {uterus exam:12215}  Adnexa:  {adnexa exam:12223}  Rectal Exam: {rectal/vaginal exam:12274}        Assessment:    *** postpartum exam. Pap smear {done:10129} at today's visit.   Plan:   1. Contraception: {method:5051} 2. *** 3. Follow up in: {1-10:13787} {time; units:19136} or as needed.

## 2017-10-15 ENCOUNTER — Ambulatory Visit: Payer: Medicaid Other | Admitting: Obstetrics & Gynecology

## 2017-10-18 NOTE — Progress Notes (Deleted)
Post Partum Exam  Traci [name] is a 27 y.o. 124P4005 female who presents for a postpartum visit. She is {1-10:13787} {time; units:18646} postpartum following a {delivery:12449}. I have fully reviewed the prenatal and intrapartum course. The delivery was at 37 gestational weeks.  Anesthesia: {anesthesia types:812}. Postpartum course has been . Baby's course has been uncomplicated. Baby is feeding by {breast/bottle:69}. Bleeding {vag bleed:12292}. Bowel function is normal. Bladder function is nrormal. Patient {is/is not:9024} sexually active. Contraception method is {contraceptive method:5051}. Postpartum depression screening:neg  {Common ambulatory SmartLinks:19316} Last pap smear done *** and was {Desc; normal/abnormal:11317::"Normal"}  Review of Systems {ros; complete:30496}    Objective:  Last menstrual period 12/29/2016, unknown if currently breastfeeding.  General:  {gen appearance:16600}   Breasts:  {breast exam:1202::"inspection negative, no nipple discharge or bleeding, no masses or nodularity palpable"}  Lungs: {lung exam:16931}  Heart:  {heart exam:5510}  Abdomen: {abdomen exam:16834}   Vulva:  {labia exam:12198}  Vagina: {vagina exam:12200}  Cervix:  {cervix exam:14595}  Corpus: {uterus exam:12215}  Adnexa:  {adnexa exam:12223}  Rectal Exam: {rectal/vaginal exam:12274}        Assessment:    *** postpartum exam. Pap smear {done:10129} at today's visit.   Plan:   1. Contraception: {method:5051} 2. *** 3. Follow up in: {1-10:13787} {time; units:19136} or as needed.

## 2017-10-22 ENCOUNTER — Ambulatory Visit: Payer: Medicaid Other | Admitting: Obstetrics and Gynecology

## 2017-10-31 ENCOUNTER — Ambulatory Visit: Payer: Medicaid Other | Admitting: Family Medicine

## 2017-11-13 ENCOUNTER — Ambulatory Visit: Payer: Medicaid Other | Admitting: Obstetrics and Gynecology

## 2017-11-19 ENCOUNTER — Encounter: Payer: Self-pay | Admitting: Obstetrics and Gynecology

## 2017-11-19 ENCOUNTER — Ambulatory Visit: Payer: Medicaid Other | Admitting: Obstetrics and Gynecology

## 2017-11-20 NOTE — Progress Notes (Signed)
Patient did not keep her postpartum appointment for 11/19/2017.  Cornelia Copaharlie Yekaterina Escutia, Jr MD Attending [facility](Faculty Practice)

## 2018-01-28 ENCOUNTER — Ambulatory Visit (INDEPENDENT_AMBULATORY_CARE_PROVIDER_SITE_OTHER): Payer: Medicaid Other | Admitting: Obstetrics & Gynecology

## 2018-01-28 ENCOUNTER — Encounter: Payer: Self-pay | Admitting: Obstetrics & Gynecology

## 2018-01-28 ENCOUNTER — Other Ambulatory Visit (HOSPITAL_COMMUNITY)
Admission: RE | Admit: 2018-01-28 | Discharge: 2018-01-28 | Disposition: A | Discharge status: Home / Self Care | Payer: Medicaid Other | Source: Ambulatory Visit | Attending: Obstetrics & Gynecology | Admitting: Obstetrics & Gynecology

## 2018-01-28 ENCOUNTER — Ambulatory Visit: Payer: Medicaid Other | Admitting: Obstetrics & Gynecology

## 2018-01-28 VITALS — BP 107/74 | HR 82 | Wt 162.0 lb

## 2018-01-28 DIAGNOSIS — N760 Acute vaginitis: Secondary | ICD-10-CM | POA: Insufficient documentation

## 2018-01-28 DIAGNOSIS — B373 Candidiasis of vulva and vagina: Secondary | ICD-10-CM | POA: Diagnosis not present

## 2018-01-28 DIAGNOSIS — B9689 Other specified bacterial agents as the cause of diseases classified elsewhere: Secondary | ICD-10-CM | POA: Insufficient documentation

## 2018-01-28 DIAGNOSIS — Z0000 Encounter for general adult medical examination without abnormal findings: Secondary | ICD-10-CM | POA: Diagnosis not present

## 2018-01-28 DIAGNOSIS — Z01419 Encounter for gynecological examination (general) (routine) without abnormal findings: Secondary | ICD-10-CM | POA: Insufficient documentation

## 2018-01-28 DIAGNOSIS — K439 Ventral hernia without obstruction or gangrene: Secondary | ICD-10-CM

## 2018-01-28 DIAGNOSIS — B3731 Acute candidiasis of vulva and vagina: Secondary | ICD-10-CM

## 2018-01-28 NOTE — Patient Instructions (Signed)

## 2018-01-28 NOTE — Progress Notes (Signed)
GYNECOLOGY ANNUAL PREVENTATIVE CARE ENCOUNTER NOTE  Subjective:   Traci [name] is a 27 y.o. [phone] female here for a routine annual gynecologic exam.  Current complaints: was not seen after delivery of her twins in June 2019, missed her 2 hr GTT given h/o GDM. Also reports symptomatic ventral hernia, hurts more occasionally and desires management by Surgery.   Denies abnormal vaginal bleeding, discharge, pelvic pain, problems with intercourse or other gynecologic concerns.    Gynecologic History No LMP recorded. Contraception: tubal ligation Last Pap: 2015. Results were: normal   Obstetric History OB History  Gravida Para Term Preterm AB Living  4 4 4     5   SAB TAB Ectopic Multiple Live Births        1 5    # Outcome Date GA Lbr Len/2nd Weight Sex Delivery Anes PTL Lv  4A Term 09/14/17 [redacted]w[redacted]d 06:52 / 00:58 6 lb 7.6 oz (2.938 kg) M Vag-Spont EPI  LIV  4B Term 09/14/17 [redacted]w[redacted]d 06:52 / 01:41 5 lb 10.7 oz (2.57 kg) M Vag-Spont EPI  LIV  3 Term 04/27/14 [redacted]w[redacted]d 20:08 / 00:27 7 lb 11.5 oz (3.5 kg) M Vag-Spont EPI  LIV  2 Term 02/27/12 [redacted]w[redacted]d 06:48 / 01:04 6 lb 6.7 oz (2.912 kg) M Vag-Spont EPI  LIV     Birth Comments: WNL  1 Term 2011 [redacted]w[redacted]d 12:00 6 lb 2 oz (2.778 kg) M Vag-Spont EPI  LIV    Obstetric Comments  #1 high blood pressue  # 2 gest diab, diet controlled  #3 gest diab, on oral med    Past Medical History:  Diagnosis Date  . Alpha thalassemia silent carrier 04/22/2017   Offer partner testing at next visit (horizon carrier screen)  . Gestational diabetes    metformin  . Preeclampsia   . UTI (urinary tract infection)    UTI  . Ventral hernia without obstruction or gangrene    reduced in the ER 2016    Past Surgical History:  Procedure Laterality Date  . tooth extaction    . TUBAL LIGATION Bilateral 09/15/2017   Procedure: POST PARTUM TUBAL LIGATION;  Surgeon: Lazaro Arms, MD;  Location: [facility];  Service: Gynecology;  Laterality: Bilateral;     Current Outpatient Medications on File Prior to Visit  Medication Sig Dispense Refill  . Prenatal Multivit-Min-Fe-FA (PRENATAL VITAMINS) 0.8 MG tablet Take 1 tablet by mouth daily. 30 tablet 12   No current facility-administered medications on file prior to visit.     No Known Allergies  Social History:  reports that she has never smoked. She has never used smokeless tobacco. She reports that she does not drink alcohol or use drugs.  Family History  Problem Relation Age of Onset  . Diabetes Maternal Grandmother     The following portions of the patient's history were reviewed and updated as appropriate: allergies, current medications, past family history, past medical history, past social history, past surgical history and problem list.  Review of Systems Pertinent items noted in HPI and remainder of comprehensive ROS otherwise negative.   Objective:  BP 107/74   Pulse 82   Wt 162 lb (73.5 kg)   BMI 27.81 kg/m  CONSTITUTIONAL: Well-developed, well-nourished female in no acute distress.  HENT:  Normocephalic, atraumatic, External right and left ear normal. Oropharynx is clear and moist EYES: Conjunctivae and EOM are normal. Pupils are equal, round, and reactive to light. No scleral icterus.  NECK: Normal range of motion, supple,  no masses.  Normal thyroid.  SKIN: Skin is warm and dry. No rash noted. Not diaphoretic. No erythema. No pallor. MUSCULOSKELETAL: Normal range of motion. No tenderness.  No cyanosis, clubbing, or edema.  2+ distal pulses. NEUROLOGIC: Alert and oriented to person, place, and time. Normal reflexes, muscle tone coordination. No cranial nerve deficit noted. PSYCHIATRIC: Normal mood and affect. Normal behavior. Normal judgment and thought content. CARDIOVASCULAR: Normal heart rate noted, regular rhythm RESPIRATORY: Clear to auscultation bilaterally. Effort and breath sounds normal, no problems with respiration noted. BREASTS: Symmetric in size. No masses,  skin changes, nipple drainage, or lymphadenopathy. ABDOMEN: Soft, normal bowel sounds, no distention noted. 10 cm x 10 cm supraumbilical ventral hernia noted, reducible, milldy tender to touch. No obstruction or erythema noted.  For the rest of abdomen, no tenderness, rebound or guarding.  PELVIC: Normal appearing external genitalia; normal appearing vaginal mucosa and cervix.  No abnormal discharge noted.  Pap smear obtained.  Normal uterine size, no other palpable masses, no uterine or adnexal tenderness.   Assessment and Plan:  1. Well woman exam with routine gynecological exam - Cytology - PAP - Lipid panel - Comprehensive metabolic panel - Hemoglobin A1c - TSH - CBC - Hepatitis B surface antigen - Hepatitis C antibody - HIV Antibody (routine testing w rflx) - RPR Will follow up results of pap smear and preventative health maintenance labs and manage accordingly.  2. Ventral hernia without obstruction or gangrene Referral to [facility] for evaluation and treatment of symptomatic ventral hernia - Ambulatory referral to General Surgery  Please refer to After Visit Summary for other counseling recommendations.    Jaynie Collins, MD, FACOG Obstetrician & Gynecologist, [facility] for [employer], [facility]

## 2018-01-29 LAB — LIPID PANEL
CHOLESTEROL TOTAL: 134 mg/dL (ref 100–199)
Chol/HDL Ratio: 2.6 ratio (ref 0.0–4.4)
HDL: 51 mg/dL (ref >39)
LDL Calculated: 77 mg/dL (ref 0–99)
Triglycerides: 31 mg/dL (ref 0–149)
VLDL Cholesterol Cal: 6 mg/dL (ref 5–40)

## 2018-01-29 LAB — COMPREHENSIVE METABOLIC PANEL
ALBUMIN: 4.3 g/dL (ref 3.5–5.5)
ALT: 9 IU/L (ref 0–32)
AST: 13 IU/L (ref 0–40)
Albumin/Globulin Ratio: 1.7 (ref 1.2–2.2)
Alkaline Phosphatase: 101 IU/L (ref 39–117)
BUN / CREAT RATIO: 18 (ref 9–23)
BUN: 12 mg/dL (ref 6–20)
Bilirubin Total: <0.2 mg/dL (ref 0.0–1.2)
CALCIUM: 9.3 mg/dL (ref 8.7–10.2)
CO2: 22 mmol/L (ref 20–29)
CREATININE: 0.65 mg/dL (ref 0.57–1.00)
Chloride: 108 mmol/L — ABNORMAL HIGH (ref 96–106)
GFR calc Af Amer: 141 mL/min/{1.73_m2} (ref >59)
GFR, EST NON AFRICAN AMERICAN: 122 mL/min/{1.73_m2} (ref >59)
GLOBULIN, TOTAL: 2.5 g/dL (ref 1.5–4.5)
Glucose: 89 mg/dL (ref 65–99)
Potassium: 4.1 mmol/L (ref 3.5–5.2)
SODIUM: 143 mmol/L (ref 134–144)
Total Protein: 6.8 g/dL (ref 6.0–8.5)

## 2018-01-29 LAB — HEPATITIS B SURFACE ANTIGEN: HEP B S AG: NEGATIVE

## 2018-01-29 LAB — CYTOLOGY - PAP
Bacterial vaginitis: POSITIVE — AB
CHLAMYDIA, DNA PROBE: NEGATIVE
Candida vaginitis: POSITIVE — AB
Diagnosis: NEGATIVE
NEISSERIA GONORRHEA: NEGATIVE
Trichomonas: NEGATIVE

## 2018-01-29 LAB — HEMOGLOBIN A1C
ESTIMATED AVERAGE GLUCOSE: 114 mg/dL
Hgb A1c MFr Bld: 5.6 % (ref 4.8–5.6)

## 2018-01-29 LAB — CBC
HEMOGLOBIN: 12.7 g/dL (ref 11.1–15.9)
Hematocrit: 37 % (ref 34.0–46.6)
MCH: 28.5 pg (ref 26.6–33.0)
MCHC: 34.3 g/dL (ref 31.5–35.7)
MCV: 83 fL (ref 79–97)
Platelets: 213 10*3/uL (ref 150–450)
RBC: 4.45 x10E6/uL (ref 3.77–5.28)
RDW: 14.2 % (ref 12.3–15.4)
WBC: 6.1 10*3/uL (ref 3.4–10.8)

## 2018-01-29 LAB — HEPATITIS C ANTIBODY: Hep C Virus Ab: <0.1 s/co ratio (ref 0.0–0.9)

## 2018-01-29 LAB — RPR: RPR Ser Ql: NONREACTIVE

## 2018-01-29 LAB — HIV ANTIBODY (ROUTINE TESTING W REFLEX): HIV SCREEN 4TH GENERATION: NONREACTIVE

## 2018-01-29 LAB — TSH: TSH: 2.72 u[IU]/mL (ref 0.450–4.500)

## 2018-01-30 MED ORDER — FLUCONAZOLE 150 MG PO TABS: 150.0000 mg | ORAL_TABLET | Freq: Once | ORAL | 3 refills | Status: AC

## 2018-01-30 MED ORDER — METRONIDAZOLE 500 MG PO TABS: 500.0000 mg | ORAL_TABLET | Freq: Two times a day (BID) | ORAL | 0 refills | Status: DC

## 2018-01-30 NOTE — Progress Notes (Signed)
Lab Addendum  Normal pap smear but vaginal discharge test is abnormal and showed bacterial vaginitis and yeast infection. Metronidazole and Diflucan were prescribed. Patient was informed via MyChart and advised to pick up prescription.  Jaynie Collins, MD 01/30/2018 9:58 AM

## 2018-01-30 NOTE — Addendum Note (Signed)
Addended by: Jaynie Collins A on: 01/30/2018 09:59 AM   Modules accepted: Orders

## 2018-11-03 ENCOUNTER — Telehealth: Payer: Self-pay

## 2018-11-03 NOTE — Telephone Encounter (Signed)
Patient wanted to get information on general pcp.

## 2018-11-12 ENCOUNTER — Ambulatory Visit: Payer: Medicaid Other | Admitting: Advanced Practice Midwife

## 2019-01-08 ENCOUNTER — Telehealth (HOSPITAL_COMMUNITY): Payer: Self-pay | Admitting: Lactation Services

## 2019-01-08 ENCOUNTER — Encounter: Payer: Self-pay | Admitting: Obstetrics and Gynecology

## 2019-01-08 ENCOUNTER — Ambulatory Visit (INDEPENDENT_AMBULATORY_CARE_PROVIDER_SITE_OTHER): Payer: Medicaid Other | Admitting: Obstetrics and Gynecology

## 2019-01-08 ENCOUNTER — Other Ambulatory Visit: Payer: Self-pay

## 2019-01-08 VITALS — BP 132/85 | HR 76 | Temp 98.1°F | Ht 63.0 in | Wt 159.4 lb

## 2019-01-08 DIAGNOSIS — K439 Ventral hernia without obstruction or gangrene: Secondary | ICD-10-CM

## 2019-01-08 DIAGNOSIS — O9229 Other disorders of breast associated with pregnancy and the puerperium: Secondary | ICD-10-CM

## 2019-01-08 NOTE — Telephone Encounter (Signed)
-----   Message from Charlie Pickens, MD sent at 01/08/2019 10:05 AM EDT ----- Regarding: can either of y'all call her.... Had twins about a year and she is wondering on strategies to wean them off completely or just do a night time feed before bed.   Can y'all do a phone or virtual visit with her? Thanks!  

## 2019-01-08 NOTE — Progress Notes (Signed)
Pt. States pain in breast comes and goes but started in August 2020. Pt. Said it started in right breast and then moved to left. Pt. Stated area was very tender and could not raise left arm.

## 2019-01-08 NOTE — Telephone Encounter (Signed)
Attempted to call pt to discussed BF concerns. Call went straight to voicemail and said mailbox was full. Will try to reach pt again at a later time.

## 2019-01-08 NOTE — Progress Notes (Signed)
Obstetrics and Gynecology Visit Return Patient Evaluation  Appointment Date: 01/08/2019  OBGYN Clinic: [facility]  Chief Complaint: h/o mastalgia  History of Present Illness:  Traci [name] is a 28 y.o. with above CC. Patient had some left sided mastalgia that felt sharp, like lighting in the LUQ/axilla area of the breast, lumps/bumps/nipple discharge in august. That pain then migrated over the right and same location and characteristics in September. Pt is currently asymptomatic  She is wondering about weaning her twins off completely or just to night feeds.  She denies any nausea, vomiting, fevers, chills.    Review of Systems: as noted in the History of Present Illness.  Patient Active Problem List   Diagnosis Date Noted  . Supraumbilical hernia without obstruction 03/25/2017   Medications:  [name] had no medications administered during this visit. Current Outpatient Medications  Medication Sig Dispense Refill  . Prenatal Multivit-Min-Fe-FA (PRENATAL VITAMINS) 0.8 MG tablet Take 1 tablet by mouth daily. 30 tablet 12   No current facility-administered medications for this visit.     Allergies: has No Known Allergies.  Physical Exam:  BP 132/85   Pulse 76   Temp 98.1 F (36.7 C) (Oral)   Ht 5\' 3"  (1.6 m)   Wt 159 lb 6.4 oz (72.3 kg)   LMP 12/09/2018 (Approximate)   Breastfeeding Yes   BMI 28.24 kg/m  Body mass index is 28.24 kg/m. General appearance: Well nourished, well developed female in no acute distress.  Breast, nipple: normal bilaterally Abdomen: diffusely non tender to palpation, non distended. 4-5cm x 3-6IW suprambilical hernia just above the umbilicus. Nttp, easily reducible Neuro/Psych:  Normal mood and affect.   Assessment: pt doing well  Plan:  1. Postpartum mastalgia Likely related to feeding. Recommend OTC topical analgesics, compression and warm/cool compresses if it happens again. In basket message  sent to lactation to have them talk to her about weaning completely  2. Supraumbilical hernia without obstruction Pt has had BTL. Pt amenable to GSU consult   RTC: PRN  Durene Romans MD Attending Center for [facility])

## 2019-01-16 ENCOUNTER — Telehealth (INDEPENDENT_AMBULATORY_CARE_PROVIDER_SITE_OTHER): Payer: Medicaid Other | Admitting: Lactation Services

## 2019-01-16 DIAGNOSIS — Z9189 Other specified personal risk factors, not elsewhere classified: Secondary | ICD-10-CM

## 2019-01-16 NOTE — Telephone Encounter (Signed)
-----   Message from Aletha Halim, MD sent at 01/08/2019 10:05 AM EDT ----- Regarding: can either of y'all call her.... Had twins about a year and she is wondering on strategies to wean them off completely or just do a night time feed before bed.   Can y'all do a phone or virtual visit with her? Thanks!

## 2019-01-16 NOTE — Telephone Encounter (Signed)
Attempted to call pt x 4 from 2 different phone, received a message that call cannot be completed at this time.

## 2019-01-20 NOTE — Telephone Encounter (Signed)
Called and spoke with pt. Pt reports that infants nurses in the am, feeds them breakfast, breast throughout the day. Infants will take not take bottles or sippy cups while mom is at work or school. Pt will drink from regular cup. Mom reports infants do drink while she is gone. Mom reports infants are wanting to breast feeding in the evening. They will sleep through the night with occasional awakening to BF, they nurse for a brief period and fall back asleep.   Mom reports FOB is available to help as needed.   Mom reports her biggest concern is that she would like to stop BF. Mom reports sometimes infants will take a nap or go to bed without it, they will fuss some and then go to sleep, other times infants will cry a lot and she gives them the breast.   She offers food and drinks and infants still want the breast. Mom thinks she is still producing milk. It seems like infants are using mom as a pacifier. Infants are pulling on mom's shirt and getting up in her lap.   Mom reports she is having some pain in her breasts at times. She is using warm compresses and that has helped.   Reviewed quitting cold [place] by trying to take a short trip away from infants is able.   Trying to dry up milk such as liberal application of cabbage leaves, sudafed 12 hours for a few days, sage tea to try to dry up her milk more.   Reviewed having someone else respond to infants instead of mom to see if they can distract them.   Enc mom to be patient and to set up a routine with infants and to be consistent if she wants to wean infants. Mom reports she has no further questions/concerns at this time.

## 2019-01-22 ENCOUNTER — Ambulatory Visit (INDEPENDENT_AMBULATORY_CARE_PROVIDER_SITE_OTHER): Payer: Medicaid Other | Admitting: General Surgery

## 2019-01-22 ENCOUNTER — Other Ambulatory Visit: Payer: Self-pay

## 2019-01-22 ENCOUNTER — Encounter: Payer: Self-pay | Admitting: General Surgery

## 2019-01-22 VITALS — BP 119/86 | HR 75 | Temp 97.5°F | Ht 63.5 in | Wt 162.6 lb

## 2019-01-22 DIAGNOSIS — K439 Ventral hernia without obstruction or gangrene: Secondary | ICD-10-CM | POA: Diagnosis not present

## 2019-01-22 NOTE — Patient Instructions (Addendum)
Our surgery scheduler Marylene Land will contact you within 24-48 hours to get you scheduled. Please have the BLUE sheet available when she calls you. Please feel free to contact our office if you have any questions or concerns.     Hernia, Adult     A hernia is the bulging of an organ or tissue through a weak spot in the muscles of the abdomen (abdominal wall). Hernias develop most often near the belly button (navel) or the area where the leg meets the lower abdomen (groin). Common types of hernias include:  Incisional hernia. This type bulges through a scar from an abdominal surgery.  Umbilical hernia. This type develops near the navel.  Inguinal hernia. This type develops in the groin or scrotum.  Femoral hernia. This type develops under the groin, in the upper thigh area.  Hiatal hernia. This type occurs when part of the stomach slides above the muscle that separates the abdomen from the chest (diaphragm). What are the causes? This condition may be caused by:  Heavy lifting.  Coughing over a long period of time.  Straining to have a bowel movement. Constipation can lead to straining.  An incision made during an abdominal surgery.  A physical problem that is present at birth (congenital defect).  Being overweight or obese.  Smoking.  Excess fluid in the abdomen.  Undescended testicles in males. What are the signs or symptoms? The main symptom is a skin-colored, rounded bulge in the area of the hernia. However, a bulge may not always be present. It may grow bigger or be more visible when you cough or strain (such as when lifting something heavy). A hernia that can be pushed back into the area (is reducible) rarely causes pain. A hernia that cannot be pushed back into the area (is incarcerated) may lose its blood supply (become strangulated). A hernia that is incarcerated may cause:  Pain.  Fever.  Nausea and vomiting.  Swelling.  Constipation. How is this diagnosed? A  hernia may be diagnosed based on:  Your symptoms and medical history.  A physical exam. Your health care provider may ask you to cough or move in certain ways to see if the hernia becomes visible.  Imaging tests, such as: ? X-rays. ? Ultrasound. ? CT scan. How is this treated? A hernia that is small and painless may not need to be treated. A hernia that is large or painful may be treated with surgery. Inguinal hernias may be treated with surgery to prevent incarceration or strangulation. Strangulated hernias are always treated with surgery because a lack of blood supply to the trapped organ or tissue can cause it to die. Surgery to treat a hernia involves pushing the bulge back into place and repairing the weak area of the muscle or abdominal wall. Follow these instructions at home: Activity  Avoid straining.  Do not lift anything that is heavier than 10 lb (4.5 kg), or the limit that you are told, until your health care provider says that it is safe.  When lifting heavy objects, lift with your leg muscles, not your back muscles. Preventing constipation  Take actions to prevent constipation. Constipation leads to straining with bowel movements, which can make a hernia worse or cause a hernia repair to break down. Your health care provider may recommend that you: ? Drink enough fluid to keep your urine pale yellow. ? Eat foods that are high in fiber, such as fresh fruits and vegetables, whole grains, and beans. ? Limit foods that  are high in fat and processed sugars, such as fried or sweet foods. ? Take an over-the-counter or prescription medicine for constipation. General instructions  When coughing, try to cough gently.  You may try to push the hernia back in place by very gently pressing on it while lying down. Do not try to force the bulge back in if it will not push in easily.  If you are overweight, work with your health care provider to lose weight safely.  Do not use any  products that contain nicotine or tobacco, such as cigarettes and e-cigarettes. If you need help quitting, ask your health care provider.  If you are scheduled for hernia repair, watch your hernia for any changes in shape, size, or color. Tell your health care provider about any changes or new symptoms.  Take over-the-counter and prescription medicines only as told by your health care provider.  Keep all follow-up visits as told by your health care provider. This is important. Contact a health care provider if:  You develop new pain, swelling, or redness around your hernia.  You have signs of constipation, such as: ? Fewer bowel movements in a week than normal. ? Difficulty having a bowel movement. ? Stools that are dry, hard, or larger than normal. Get help right away if:  You have a fever.  You have abdomen pain that gets worse.  You feel nauseous or you vomit.  You cannot push the hernia back in place by very gently pressing on it while lying down. Do not try to force the bulge back in if it will not push in easily.  The hernia: ? Changes in shape, size, or color. ? Feels hard or tender. These symptoms may represent a serious problem that is an emergency. Do not wait to see if the symptoms will go away. Get medical help right away. Call your local emergency services (911 in the [place] Summary  A hernia is the bulging of an organ or tissue through a weak spot in the muscles of the abdomen (abdominal wall).  The main symptom is a skin-colored, rounded lump (bulge) in the hernia area. However, a bulge may not always be present. It may grow bigger or more visible when you cough or strain (such as when having a bowel movement).  A hernia that is small and painless may not need to be treated. A hernia that is large or painful may be treated with surgery.  Surgery to treat a hernia involves pushing the bulge back into place and repairing the weak part of the abdomen. This  information is not intended to replace advice given to you by your health care provider. Make sure you discuss any questions you have with your health care provider. Document Released: 03/26/2005 Document Revised: 07/17/2018 Document Reviewed: 12/26/2016 Elsevier Patient Education  2020 [employer].

## 2019-01-23 ENCOUNTER — Telehealth: Payer: Self-pay | Admitting: General Surgery

## 2019-01-23 NOTE — Telephone Encounter (Signed)
Please call and inform patient of surgery information below.   Surgery Date: 02/04/19 with Dr Karie Schwalbe of epigastric hernia.  Preadmission Testing Date: 01/28/19 between 1-5-phone interview.  Covid Testing Date: 01/30/19 between 8-10:30 - patient advised to go to the [place] ([place])  [employer] Video sent via [employer] and [employer].  Make patient aware to call [phone], between 1-3:00pm the day before surgery, to find out what time to arrive.

## 2019-01-23 NOTE — Telephone Encounter (Signed)
Called patient but unable to leave message due to voicemail box being full. Please give surgery information to patient if she calls back

## 2019-01-23 NOTE — Progress Notes (Signed)
Patient ID: Traci [name], female   DOB: 03/13/1991, 28 y.o.   MRN: [id number]  Chief Complaint  Patient presents with  . New Patient (Initial Visit)    Hernia - Umbilical     HPI Traci [name] is a 28 y.o. female.  Has been referred for surgical evaluation of an umbilical hernia.  Traci [name] states that Traci [name] first noticed it after the birth of her second child in 2013.  It got worse after another pregnancy in 2016.  Traci [name] subsequently had a twin pregnancy which further exacerbated the defect.  Traci [name] saw a general surgeon in 2017 for consideration of repair, but at that time, Traci [name] was very anxious and nervous about the surgery and did not proceed.  Traci [name] states that the bulge is always present when Traci [name] is sitting up but it reduces when Traci [name] lies down.  It has become increasingly painful.  The pain is localized to the area of the hernia.  Traci [name] denies any nausea or vomiting.  There has never been a time when the hernia was unable to be reduced.  Due to worsening symptoms, Traci [name] is interested in surgical repair at this time.   Past Medical History:  Diagnosis Date  . Alpha thalassemia silent carrier 04/22/2017   Offer partner testing at next visit (horizon carrier screen)  . Gestational diabetes    metformin  . Preeclampsia   . UTI (urinary tract infection)    UTI  . Ventral hernia without obstruction or gangrene    reduced in the ER 2016    Past Surgical History:  Procedure Laterality Date  . tooth extaction    . TUBAL LIGATION Bilateral 09/15/2017   Procedure: POST PARTUM TUBAL LIGATION;  Surgeon: Florian Buff, MD;  Location: [facility];  Service: Gynecology;  Laterality: Bilateral;    Family History  Problem Relation Age of Onset  . Diabetes Maternal Grandmother     Social History Social History   Tobacco Use  . Smoking status: Never Smoker  . Smokeless tobacco: Never Used  Substance Use Topics  . Alcohol use: No    Alcohol/week: 0.0 standard drinks  . Drug use: No    No Known Allergies  Current Outpatient Medications  Medication Sig Dispense Refill  . Prenatal Multivit-Min-Fe-FA (PRENATAL VITAMINS) 0.8 MG tablet Take 1 tablet by mouth daily. 30 tablet 12   No current facility-administered medications for this visit.     Review of Systems Review of Systems  Gastrointestinal: Positive for abdominal pain.  All other systems reviewed and are negative.   Blood pressure 119/86, pulse 75, temperature (!) 97.5 F (36.4 C), temperature source Temporal, height 5' 3.5" (1.613 m), weight 162 lb 9.6 oz (73.8 kg), last menstrual period 01/09/2019, SpO2 97 %, currently breastfeeding. Body mass index is 28.35 kg/m.   Physical Exam Physical Exam Constitutional:      General: Traci [name] is not in acute distress. HENT:     Head: Normocephalic and atraumatic.     Nose:     Comments: Covered with a mask secondary to COVID-19 precautions    Mouth/Throat:     Comments: Covered with a mask secondary to COVID-19 precautions Eyes:     General: No scleral icterus.       Right eye: No discharge.        Left eye: No discharge.     Conjunctiva/sclera: Conjunctivae normal.  Neck:     Musculoskeletal: No neck rigidity.     Comments: No palpable thyromegaly or  dominant thyroid masses identified Cardiovascular:     Rate and Rhythm: Normal rate and regular rhythm.  Pulmonary:     Effort: Pulmonary effort is normal.     Breath sounds: Normal breath sounds.  Abdominal:     General: Bowel sounds are normal.     Palpations: Abdomen is soft.     Hernia: A hernia is present. Hernia is present in the ventral area.       Comments: The hernia is actually in the supraumbilical/epigastric region, rather than umbilical.  It reduces easily with the patient in the supine position.  I can palpate the fascial defect and it feels quite small.  Genitourinary:    Comments: Deferred Musculoskeletal:     Right lower leg: No edema.     Left lower leg: No edema.  Lymphadenopathy:      Cervical: No cervical adenopathy.  Skin:    General: Skin is warm and dry.  Neurological:     General: No focal deficit present.     Mental Status: Traci [name] is alert and oriented to person, place, and time.  Psychiatric:        Mood and Affect: Mood normal.        Behavior: Behavior normal.     Data Reviewed There is no imaging nor any other relevant data to review.  Assessment This is a 28 year old woman who has an increasingly symptomatic epigastric hernia.  Traci [name] desires surgical repair.  Plan I have offered her open repair of her hernia.  Based upon my physical examination, I do not think mesh will be necessary, but told her that if it is necessary, I will not hesitate to place a piece of mesh.  I discussed the risks of surgery with her.  These include, but are not limited to, bleeding, infection, mesh complications or infection (if used), and recurrence.  Traci [name] agreed to accept these risks and we will get her scheduled for an operation.    Duanne Guess 01/23/2019, 7:54 AM

## 2019-01-26 NOTE — Telephone Encounter (Signed)
Patient has been given all her surgery information. Patient did want to change her surgery to be done in December.   Pt has been advised of pre admission date/time, Covid Testing date and Surgery date.  Surgery Date: 03/25/19 with Dr Barton Dubois hernia repair.  Preadmission Testing Date: 03/16/19 between 8-1:00pm-phone interview.  Covid Testing Date: 03/20/19 between 8-10:30am - patient advised to go to the [facility] ([place])  [employer] Video sent via [employer] and [employer].  Patient has been made aware to call [phone], between 1-3:00pm the day before surgery, to find out what time to arrive.    All information has been mailed to the address verified in the chart.

## 2019-01-28 ENCOUNTER — Other Ambulatory Visit: Payer: Medicaid Other

## 2019-01-30 ENCOUNTER — Other Ambulatory Visit: Payer: Medicaid Other

## 2019-03-16 ENCOUNTER — Encounter
Admission: RE | Admit: 2019-03-16 | Discharge: 2019-03-16 | Disposition: A | Discharge status: Home / Self Care | Payer: Medicaid Other | Source: Ambulatory Visit | Attending: General Surgery | Admitting: General Surgery

## 2019-03-16 ENCOUNTER — Other Ambulatory Visit: Payer: Self-pay

## 2019-03-16 NOTE — Patient Instructions (Addendum)
Your procedure is scheduled on: Wed. 12/16 Report to Day surgery 2nd floor medical mall  To find out your arrival time please call [phone] between 1PM - 3PM on Tues 12/15  Remember: Instructions that are not followed completely may result in serious medical risk,  up to and including death, or upon the discretion of your surgeon and anesthesiologist your  surgery may need to be rescheduled.     _X__ 1. Do not eat food after midnight the night before your procedure.                 No gum chewing or hard candies. You may drink clear liquids up to 2 hours                 before you are scheduled to arrive for your surgery- DO not drink clear                 liquids within 2 hours of the start of your surgery.                 Clear Liquids include:  water, apple juice without pulp, clear carbohydrate                 drink such as Clearfast of Gatorade, Black Coffee or Tea (Do not add                 anything to coffee or tea).  __X__2.  On the morning of surgery brush your teeth with toothpaste and water, you                may rinse your mouth with mouthwash if you wish.  Do not swallow any toothpaste of mouthwash.     ___ 3.  No Alcohol for 24 hours before or after surgery.   ___ 4.  Do Not Smoke or use e-cigarettes For 24 Hours Prior to Your Surgery.                 Do not use any chewable tobacco products for at least 6 hours prior to                 surgery.  ____  5.  Bring all medications with you on the day of surgery if instructed.   __x__  6.  Notify your doctor if there is any change in your medical condition      (cold, fever, infections).     Do not wear jewelry, make-up, hairpins, clips or nail polish. Do not wear lotions, powders, or perfumes. You may wear deodorant. Do not shave 48 hours prior to surgery. Men may shave face and neck. Do not bring valuables to the hospital.    [facility] is not responsible for any belongings or  valuables.  Contacts, dentures or bridgework may not be worn into surgery. Leave your suitcase in the car. After surgery it may be brought to your room. For patients admitted to the hospital, discharge time is determined by your treatment team.   Patients discharged the day of surgery will not be allowed to drive home.   Please read over the following fact sheets that you were given:     ____ Take these medicines the morning of surgery with A SIP OF WATER:    1. none  2.   3.   4.  5.  6.  ____ Fleet Enema (as directed)   __x__ Use CHG Soap as directed  ____  Use inhalers on the day of surgery  ____ Stop metformin 2 days prior to surgery    ____ Take 1/2 of usual insulin dose the night before surgery. No insulin the morning          of surgery.   ____ Stop Coumadin/Plavix/aspirin on   __x__ Stop Anti-inflammatories ibuprofen aleve or aspirin  May take tylenol   ____ Stop supplements until after surgery.    ____ Bring C-Pap to the hospital.

## 2019-03-20 ENCOUNTER — Other Ambulatory Visit
Admission: RE | Admit: 2019-03-20 | Discharge: 2019-03-20 | Disposition: A | Discharge status: Home / Self Care | Payer: Medicaid Other | Source: Ambulatory Visit | Attending: General Surgery | Admitting: General Surgery

## 2019-03-20 DIAGNOSIS — Z20828 Contact with and (suspected) exposure to other viral communicable diseases: Secondary | ICD-10-CM | POA: Insufficient documentation

## 2019-03-20 DIAGNOSIS — Z01812 Encounter for preprocedural laboratory examination: Secondary | ICD-10-CM | POA: Insufficient documentation

## 2019-03-20 LAB — SARS CORONAVIRUS 2 (TAT 6-24 HRS): SARS Coronavirus 2: NEGATIVE

## 2019-03-24 MED ORDER — CEFAZOLIN SODIUM-DEXTROSE 2-4 GM/100ML-% IV SOLN
2.0000 g | INTRAVENOUS | Status: AC
  Administered 2019-03-25: 2 g via INTRAVENOUS

## 2019-03-25 ENCOUNTER — Encounter
Admission: RE | Disposition: A | Discharge status: Home / Self Care | Payer: Self-pay | Source: Home / Self Care | Attending: General Surgery

## 2019-03-25 ENCOUNTER — Encounter: Payer: Self-pay | Admitting: General Surgery

## 2019-03-25 ENCOUNTER — Ambulatory Visit: Payer: Medicaid Other | Admitting: Certified Registered Nurse Anesthetist

## 2019-03-25 ENCOUNTER — Other Ambulatory Visit: Payer: Self-pay

## 2019-03-25 ENCOUNTER — Ambulatory Visit
Admission: RE | Admit: 2019-03-25 | Discharge: 2019-03-25 | Disposition: A | Discharge status: Home / Self Care | Payer: Medicaid Other | Attending: General Surgery | Admitting: General Surgery

## 2019-03-25 DIAGNOSIS — D563 Thalassemia minor: Secondary | ICD-10-CM | POA: Diagnosis not present

## 2019-03-25 DIAGNOSIS — K439 Ventral hernia without obstruction or gangrene: Secondary | ICD-10-CM

## 2019-03-25 DIAGNOSIS — L987 Excessive and redundant skin and subcutaneous tissue: Secondary | ICD-10-CM | POA: Diagnosis not present

## 2019-03-25 HISTORY — PX: EPIGASTRIC HERNIA REPAIR: SHX404

## 2019-03-25 LAB — POCT PREGNANCY, URINE: Preg Test, Ur: NEGATIVE

## 2019-03-25 SURGERY — REPAIR, HERNIA, EPIGASTRIC, ADULT
Anesthesia: General

## 2019-03-25 MED ORDER — CHLORHEXIDINE GLUCONATE CLOTH 2 % EX PADS: 6.0000 | MEDICATED_PAD | Freq: Once | CUTANEOUS | Status: DC

## 2019-03-25 MED ORDER — HYDROCODONE-ACETAMINOPHEN 5-325 MG PO TABS: 1.0000 | ORAL_TABLET | Freq: Four times a day (QID) | ORAL | 0 refills | Status: DC | PRN

## 2019-03-25 MED ORDER — MIDAZOLAM HCL 2 MG/2ML IJ SOLN
INTRAMUSCULAR | Status: DC | PRN
  Administered 2019-03-25: 2 mg via INTRAVENOUS

## 2019-03-25 MED ORDER — BUPIVACAINE HCL (PF) 0.25 % IJ SOLN
INTRAMUSCULAR | Status: AC
  Filled 2019-03-25: qty 30

## 2019-03-25 MED ORDER — FAMOTIDINE 20 MG PO TABS
ORAL_TABLET | ORAL | Status: AC
  Administered 2019-03-25: 20 mg via ORAL
  Filled 2019-03-25: qty 1

## 2019-03-25 MED ORDER — ACETAMINOPHEN 500 MG PO TABS
1000.0000 mg | ORAL_TABLET | ORAL | Status: AC
  Administered 2019-03-25: 1000 mg via ORAL

## 2019-03-25 MED ORDER — BUPIVACAINE LIPOSOME 1.3 % IJ SUSP
INTRAMUSCULAR | Status: AC
  Filled 2019-03-25: qty 20

## 2019-03-25 MED ORDER — PROPOFOL 500 MG/50ML IV EMUL
INTRAVENOUS | Status: AC
  Filled 2019-03-25: qty 50

## 2019-03-25 MED ORDER — FENTANYL CITRATE (PF) 100 MCG/2ML IJ SOLN
INTRAMUSCULAR | Status: DC | PRN
  Administered 2019-03-25 (×2): 25 ug via INTRAVENOUS
  Administered 2019-03-25: 150 ug via INTRAVENOUS
  Administered 2019-03-25: 50 ug via INTRAVENOUS

## 2019-03-25 MED ORDER — FENTANYL CITRATE (PF) 250 MCG/5ML IJ SOLN
INTRAMUSCULAR | Status: AC
  Filled 2019-03-25: qty 5

## 2019-03-25 MED ORDER — GABAPENTIN 300 MG PO CAPS
ORAL_CAPSULE | ORAL | Status: AC
  Administered 2019-03-25: 300 mg via ORAL
  Filled 2019-03-25: qty 1

## 2019-03-25 MED ORDER — BUPIVACAINE HCL (PF) 0.25 % IJ SOLN
INTRAMUSCULAR | Status: DC | PRN
  Administered 2019-03-25: 10 mL

## 2019-03-25 MED ORDER — ONDANSETRON HCL 4 MG/2ML IJ SOLN
INTRAMUSCULAR | Status: DC | PRN
  Administered 2019-03-25: 4 mg via INTRAVENOUS

## 2019-03-25 MED ORDER — MIDAZOLAM HCL 2 MG/2ML IJ SOLN
INTRAMUSCULAR | Status: AC
  Filled 2019-03-25: qty 2

## 2019-03-25 MED ORDER — BUPIVACAINE LIPOSOME 1.3 % IJ SUSP: 20.0000 mL | Freq: Once | INTRAMUSCULAR | Status: DC

## 2019-03-25 MED ORDER — IBUPROFEN 800 MG PO TABS: 800.0000 mg | ORAL_TABLET | Freq: Three times a day (TID) | ORAL | 0 refills | Status: DC | PRN

## 2019-03-25 MED ORDER — GLYCOPYRROLATE 0.2 MG/ML IJ SOLN
INTRAMUSCULAR | Status: DC | PRN
  Administered 2019-03-25: .2 mg via INTRAVENOUS

## 2019-03-25 MED ORDER — FENTANYL CITRATE (PF) 100 MCG/2ML IJ SOLN
INTRAMUSCULAR | Status: AC
  Filled 2019-03-25: qty 2

## 2019-03-25 MED ORDER — CELECOXIB 200 MG PO CAPS
200.0000 mg | ORAL_CAPSULE | ORAL | Status: AC
  Administered 2019-03-25: 200 mg via ORAL

## 2019-03-25 MED ORDER — DIPHENHYDRAMINE HCL 50 MG/ML IJ SOLN
INTRAMUSCULAR | Status: DC | PRN
  Administered 2019-03-25: 12.5 mg via INTRAVENOUS

## 2019-03-25 MED ORDER — SUGAMMADEX SODIUM 200 MG/2ML IV SOLN
INTRAVENOUS | Status: DC | PRN
  Administered 2019-03-25: 200 mg via INTRAVENOUS

## 2019-03-25 MED ORDER — LIDOCAINE HCL (CARDIAC) PF 100 MG/5ML IV SOSY
PREFILLED_SYRINGE | INTRAVENOUS | Status: DC | PRN
  Administered 2019-03-25: 60 mg via INTRAVENOUS

## 2019-03-25 MED ORDER — FENTANYL CITRATE (PF) 100 MCG/2ML IJ SOLN
25.0000 ug | INTRAMUSCULAR | Status: DC | PRN
  Administered 2019-03-25: 25 ug via INTRAVENOUS

## 2019-03-25 MED ORDER — CEFAZOLIN SODIUM-DEXTROSE 2-4 GM/100ML-% IV SOLN
INTRAVENOUS | Status: AC
  Filled 2019-03-25: qty 100

## 2019-03-25 MED ORDER — PROPOFOL 10 MG/ML IV BOLUS
INTRAVENOUS | Status: DC | PRN
  Administered 2019-03-25: 150 mg via INTRAVENOUS

## 2019-03-25 MED ORDER — ROCURONIUM BROMIDE 100 MG/10ML IV SOLN
INTRAVENOUS | Status: DC | PRN
  Administered 2019-03-25: 40 mg via INTRAVENOUS
  Administered 2019-03-25: 10 mg via INTRAVENOUS

## 2019-03-25 MED ORDER — ESMOLOL HCL 100 MG/10ML IV SOLN
INTRAVENOUS | Status: DC | PRN
  Administered 2019-03-25: 20 mg via INTRAVENOUS
  Administered 2019-03-25: 30 mg via INTRAVENOUS
  Administered 2019-03-25: 20 mg via INTRAVENOUS
  Administered 2019-03-25: 30 mg via INTRAVENOUS

## 2019-03-25 MED ORDER — PROMETHAZINE HCL 25 MG/ML IJ SOLN: 6.2500 mg | INTRAMUSCULAR | Status: DC | PRN

## 2019-03-25 MED ORDER — BUPIVACAINE LIPOSOME 1.3 % IJ SUSP
INTRAMUSCULAR | Status: DC | PRN
  Administered 2019-03-25: 20 mL

## 2019-03-25 MED ORDER — ACETAMINOPHEN 500 MG PO TABS
ORAL_TABLET | ORAL | Status: AC
  Filled 2019-03-25: qty 2

## 2019-03-25 MED ORDER — GABAPENTIN 300 MG PO CAPS: 300.0000 mg | ORAL_CAPSULE | ORAL | Status: AC

## 2019-03-25 MED ORDER — LIDOCAINE-EPINEPHRINE 1 %-1:100000 IJ SOLN
INTRAMUSCULAR | Status: DC | PRN
  Administered 2019-03-25: 10 mL

## 2019-03-25 MED ORDER — CELECOXIB 200 MG PO CAPS
ORAL_CAPSULE | ORAL | Status: AC
  Filled 2019-03-25: qty 1

## 2019-03-25 MED ORDER — DEXMEDETOMIDINE HCL IN NACL 200 MCG/50ML IV SOLN
INTRAVENOUS | Status: DC | PRN
  Administered 2019-03-25 (×2): 8 ug via INTRAVENOUS
  Administered 2019-03-25: 4 ug via INTRAVENOUS

## 2019-03-25 MED ORDER — FAMOTIDINE 20 MG PO TABS: 20.0000 mg | ORAL_TABLET | Freq: Once | ORAL | Status: AC

## 2019-03-25 MED ORDER — PHENYLEPHRINE HCL (PRESSORS) 10 MG/ML IV SOLN
INTRAVENOUS | Status: DC | PRN
  Administered 2019-03-25: 200 ug via INTRAVENOUS

## 2019-03-25 MED ORDER — LIDOCAINE-EPINEPHRINE 1 %-1:100000 IJ SOLN
INTRAMUSCULAR | Status: AC
  Filled 2019-03-25: qty 1

## 2019-03-25 MED ORDER — DEXAMETHASONE SODIUM PHOSPHATE 10 MG/ML IJ SOLN
INTRAMUSCULAR | Status: DC | PRN
  Administered 2019-03-25: 10 mg via INTRAVENOUS

## 2019-03-25 MED ORDER — LACTATED RINGERS IV SOLN: INTRAVENOUS | Status: DC

## 2019-03-25 SURGICAL SUPPLY — 36 items
BLADE SURG 15 STRL LF DISP TIS (BLADE) ×1 IMPLANT
BLADE SURG 15 STRL SS (BLADE) ×1
CANISTER SUCT 1200ML W/VALVE (MISCELLANEOUS) ×2 IMPLANT
CHLORAPREP W/TINT 26 (MISCELLANEOUS) ×2 IMPLANT
COVER WAND RF STERILE (DRAPES) ×1 IMPLANT
DERMABOND ADVANCED (GAUZE/BANDAGES/DRESSINGS) ×1
DERMABOND ADVANCED .7 DNX12 (GAUZE/BANDAGES/DRESSINGS) ×1 IMPLANT
DRAIN PENROSE 5/8X18 LTX STRL (WOUND CARE) ×2 IMPLANT
DRAPE LAPAROTOMY 77X122 PED (DRAPES) ×2 IMPLANT
DRSG TELFA 4X8 ISLAND PHMB (GAUZE/BANDAGES/DRESSINGS) ×1 IMPLANT
ELECT CAUTERY BLADE TIP 2.5 (TIP) ×2
ELECT REM PT RETURN 9FT ADLT (ELECTROSURGICAL) ×2
ELECTRODE CAUTERY BLDE TIP 2.5 (TIP) ×1 IMPLANT
ELECTRODE REM PT RTRN 9FT ADLT (ELECTROSURGICAL) ×1 IMPLANT
GLOVE BIO SURGEON STRL SZ 6.5 (GLOVE) ×4 IMPLANT
GLOVE INDICATOR 7.0 STRL GRN (GLOVE) ×5 IMPLANT
GOWN STRL REUS W/ TWL LRG LVL3 (GOWN DISPOSABLE) ×2 IMPLANT
GOWN STRL REUS W/TWL LRG LVL3 (GOWN DISPOSABLE) ×3
KIT TURNOVER KIT A (KITS) ×2 IMPLANT
LABEL OR SOLS (LABEL) ×2 IMPLANT
MESH VENTRALIGHT ST 4X6IN (Mesh General) ×1 IMPLANT
NEEDLE HYPO 22GX1.5 SAFETY (NEEDLE) ×2 IMPLANT
NS IRRIG 500ML POUR BTL (IV SOLUTION) ×2 IMPLANT
PACK BASIN MINOR ARMC (MISCELLANEOUS) ×2 IMPLANT
STRIP CLOSURE SKIN 1/2X4 (GAUZE/BANDAGES/DRESSINGS) ×2 IMPLANT
SUT ETHIBOND NAB MO 7 #0 18IN (SUTURE) ×3 IMPLANT
SUT MNCRL 4-0 (SUTURE) ×1
SUT MNCRL 4-0 27XMFL (SUTURE) ×1
SUT VIC AB 2-0 SH 27 (SUTURE) ×4
SUT VIC AB 2-0 SH 27XBRD (SUTURE) IMPLANT
SUT VIC AB 3-0 SH 27 (SUTURE) ×2
SUT VIC AB 3-0 SH 27X BRD (SUTURE) ×1 IMPLANT
SUTURE MNCRL 4-0 27XMF (SUTURE) ×1 IMPLANT
SYR 10ML LL (SYRINGE) ×2 IMPLANT
SYR 20ML LL LF (SYRINGE) ×1 IMPLANT
SYR BULB IRRIG 60ML STRL (SYRINGE) ×2 IMPLANT

## 2019-03-25 NOTE — Discharge Instructions (Signed)
AMBULATORY SURGERY  DISCHARGE INSTRUCTIONS   1) The drugs that you were given will stay in your system until tomorrow so for the next 24 hours you should not:  A) Drive an automobile B) Make any legal decisions C) Drink any alcoholic beverage   2) You may resume regular meals tomorrow.  Today it is better to start with liquids and gradually work up to solid foods.  You may eat anything you prefer, but it is better to start with liquids, then soup and crackers, and gradually work up to solid foods.   3) Please notify your doctor immediately if you have any unusual bleeding, trouble breathing, redness and pain at the surgery site, drainage, fever, or pain not relieved by medication.    4) Additional Instructions:        Please contact your physician with any problems or Same Day Surgery at [phone], Monday through Friday 6 am to 4 pm, or [place] at [facility] number at [phone].Open Hernia Repair, Adult, Care After This sheet gives you information about how to care for yourself after your procedure. Your health care provider may also give you more specific instructions. If you have problems or questions, contact your health care provider. What can I expect after the procedure? After the procedure, it is common to have:  Mild discomfort.  Slight bruising.  Minor swelling.  Pain in the abdomen. Follow these instructions at home: Incision care   Follow instructions from your health care provider about how to take care of your incision area. Make sure you: ? Wash your hands with soap and water before you change your bandage (dressing). If soap and water are not available, use hand sanitizer. ? Change your dressing as told by your health care provider. ? Leave stitches (sutures), skin glue, or adhesive strips in place. These skin closures may need to stay in place for 2 weeks or longer. If adhesive strip edges start to loosen and curl up, you may trim the loose  edges. Do not remove adhesive strips completely unless your health care provider tells you to do that.  Check your incision area every day for signs of infection. Check for: ? More redness, swelling, or pain. ? More fluid or blood. ? Warmth. ? Pus or a bad smell. Activity  Do not drive or use heavy machinery while taking prescription pain medicine. Do not drive until your health care provider approves.  Until your health care provider approves: ? Do not lift anything that is heavier than 10 lb (4.5 kg). ? Do not play contact sports.  Return to your normal activities as told by your health care provider. Ask your health care provider what activities are safe. General instructions  To prevent or treat constipation while you are taking prescription pain medicine, your health care provider may recommend that you: ? Drink enough fluid to keep your urine clear or pale yellow. ? Take over-the-counter or prescription medicines. ? Eat foods that are high in fiber, such as fresh fruits and vegetables, whole grains, and beans. ? Limit foods that are high in fat and processed sugars, such as fried and sweet foods.  Take over-the-counter and prescription medicines only as told by your health care provider.  Do not take tub baths or go swimming until your health care provider approves.  Keep all follow-up visits as told by your health care provider. This is important. Contact a health care provider if:  You develop a rash.  You have more redness,  swelling, or pain around your incision.  You have more fluid or blood coming from your incision.  Your incision feels warm to the touch.  You have pus or a bad smell coming from your incision.  You have a fever or chills.  You have blood in your stool (feces).  You have not had a bowel movement in 2-3 days.  Your pain is not controlled with medicine. Get help right away if:  You have chest pain or shortness of breath.  You feel  light-headed or feel faint.  You have severe pain.  You vomit and your pain is worse. This information is not intended to replace advice given to you by your health care provider. Make sure you discuss any questions you have with your health care provider. Document Released: 10/13/2004 Document Revised: 03/08/2017 Document Reviewed: 09/07/2015 Elsevier Patient Education  2020 [employer].

## 2019-03-25 NOTE — Anesthesia Procedure Notes (Signed)
Procedure Name: Intubation Performed by: Meliana Canner L, CRNA Pre-anesthesia Checklist: Patient identified, Patient being monitored, Timeout performed, Emergency Drugs available and Suction available Patient Re-evaluated:Patient Re-evaluated prior to induction Oxygen Delivery Method: Circle system utilized Preoxygenation: Pre-oxygenation with 100% oxygen Induction Type: IV induction Ventilation: Mask ventilation without difficulty Laryngoscope Size: 3 and McGraph Grade View: Grade I Tube type: Oral Tube size: 7.0 mm Number of attempts: 1 Airway Equipment and Method: Stylet Placement Confirmation: ETT inserted through vocal cords under direct vision,  positive ETCO2 and breath sounds checked- equal and bilateral Secured at: 21 cm Tube secured with: Tape Dental Injury: Teeth and Oropharynx as per pre-operative assessment        

## 2019-03-25 NOTE — Anesthesia Post-op Follow-up Note (Signed)
Anesthesia QCDR form completed.        

## 2019-03-25 NOTE — H&P (Signed)
HPI Traci [name] is a 28 y.o. female.  Has been referred for surgical evaluation of an umbilical hernia.  Traci [name] states that she first noticed it after the birth of her second child in 2013.  It got worse after another pregnancy in 2016.  She subsequently had a twin pregnancy which further exacerbated the defect.  She saw a general surgeon in 2017 for consideration of repair, but at that time, she was very anxious and nervous about the surgery and did not proceed.  She states that the bulge is always present when she is sitting up but it reduces when she lies down.  It has become increasingly painful.  The pain is localized to the area of the hernia.  She denies any nausea or vomiting.  There has never been a time when the hernia was unable to be reduced.  Due to worsening symptoms, she is interested in surgical repair at this time.         Past Medical History:  Diagnosis Date   Alpha thalassemia silent carrier 04/22/2017    Offer partner testing at next visit (horizon carrier screen)   Gestational diabetes      metformin   Preeclampsia     UTI (urinary tract infection)      UTI   Ventral hernia without obstruction or gangrene      reduced in the ER 2016           Past Surgical History:  Procedure Laterality Date   tooth extaction       TUBAL LIGATION Bilateral 09/15/2017    Procedure: POST PARTUM TUBAL LIGATION;  Surgeon: Lazaro Arms, MD;  Location: [facility];  Service: Gynecology;  Laterality: Bilateral;           Family History  Problem Relation Age of Onset   Diabetes Maternal Grandmother        Social History Social History         Tobacco Use   Smoking status: Never Smoker   Smokeless tobacco: Never Used  Substance Use Topics   Alcohol use: No      Alcohol/week: 0.0 standard drinks   Drug use: No      No Known Allergies         Current Outpatient Medications  Medication Sig Dispense Refill   Prenatal Multivit-Min-Fe-FA (PRENATAL VITAMINS)  0.8 MG tablet Take 1 tablet by mouth daily. 30 tablet 12    No current facility-administered medications for this visit.       Review of Systems Review of Systems  Gastrointestinal: Positive for abdominal pain.  All other systems reviewed and are negative.     Today's Vitals   03/25/19 0747  BP: 127/88  Pulse: 82  Resp: 16  Temp: 97.7 F (36.5 C)  TempSrc: Tympanic  SpO2: 99%  Weight: 74.4 kg  Height: 5' 3.5" (1.613 m)  PainSc: 7    Body mass index is 28.6 kg/m.      Physical Exam Physical Exam Constitutional:      General: She is not in acute distress. HENT:     Head: Normocephalic and atraumatic.     Nose:     Comments: Covered with a mask secondary to COVID-19 precautions    Mouth/Throat:     Comments: Covered with a mask secondary to COVID-19 precautions Eyes:     General: No scleral icterus.       Right eye: No discharge.        Left  eye: No discharge.     Conjunctiva/sclera: Conjunctivae normal.  Neck:     Musculoskeletal: No neck rigidity.     Comments: No palpable thyromegaly or dominant thyroid masses identified Cardiovascular:     Rate and Rhythm: Normal rate and regular rhythm.  Pulmonary:     Effort: Pulmonary effort is normal.     Breath sounds: Normal breath sounds.  Abdominal:     General: Bowel sounds are normal.     Palpations: Abdomen is soft.     Hernia: A hernia is present. Hernia is present in the ventral area.       Comments: The hernia is actually in the supraumbilical/epigastric region, rather than umbilical.  It reduces easily with the patient in the supine position.  I can palpate the fascial defect and it feels quite small.  Genitourinary:    Comments: Deferred Musculoskeletal:     Right lower leg: No edema.     Left lower leg: No edema.  Lymphadenopathy:     Cervical: No cervical adenopathy.  Skin:    General: Skin is warm and dry.  Neurological:     General: No focal deficit present.     Mental Status: She is alert and  oriented to person, place, and time.  Psychiatric:        Mood and Affect: Mood normal.        Behavior: Behavior normal.        Data Reviewed There is no imaging nor any other relevant data to review.   Assessment This is a 28 year old woman who has an increasingly symptomatic epigastric hernia.  She desires surgical repair.   Plan I have offered her open repair of her hernia.  Based upon my physical examination, I do not think mesh will be necessary, but told her that if it is necessary, I will not hesitate to place a piece of mesh.  I discussed the risks of surgery with her.  These include, but are not limited to, bleeding, infection, mesh complications or infection (if used), and recurrence.  She agreed to accept these risks and we will proceed with her operation today, as planned.

## 2019-03-25 NOTE — Transfer of Care (Signed)
Immediate Anesthesia Transfer of Care Note  Patient: Traci [name]  Procedure(s) Performed: HERNIA REPAIR EPIGASTRIC ADULT WITH MESH (N/A )  Patient Location: PACU  Anesthesia Type:General  Level of Consciousness: awake, alert  and oriented  Airway & Oxygen Therapy: Patient Spontanous Breathing  Post-op Assessment: Report given to RN and Post -op Vital signs reviewed and stable  Post vital signs: Reviewed and stable  Last Vitals:  Vitals Value Taken Time  BP 125/86 03/25/19 1035  Temp    Pulse 76 03/25/19 1043  Resp 18 03/25/19 1043  SpO2 97 % 03/25/19 1043  Vitals shown include unvalidated device data.  Last Pain:  Vitals:   03/25/19 1030  TempSrc:   PainSc: 0-No pain      Patients Stated Pain Goal: 3 (77/41/28 7867)  Complications: No apparent anesthesia complications

## 2019-03-25 NOTE — Op Note (Signed)
Operative Note  Preoperative Diagnosis:   Epigastric hernia  Postoperative Diagnosis: Complex epigastric hernia with multiple fascial defects, excess abdominal skin.  Operation: Open repair of complex epigastric hernia, excision of excess abdominal skin, complex multilayered repair of abdominal wound.  Surgeon: Duanne Guess, MD  Assistant: Laneta Simmers, RNFA  Anesthesia: General endotracheal  Findings: Upon exposure of the fascia, 3 separate defects were identified.  The falciform ligament had herniated through the most cranial of these.  Once the hernia was repaired, there was excessive skin which required excision to provide a better closure of the skin opening.  Due to the tissue laxity, a multilayered repair was used to close the skin defect.  Indications: This is a 28 year old woman who has had a hernia just cranial to her umbilicus since the birth of her first child.  Subsequent pregnancies enlarge the defect and has become increasingly symptomatic.  She desired surgical repair.  The risks of the operation were discussed with her and she agreed to proceed.  Procedure In Detail: The patient was identified in the preoperative holding area and brought to the operating room where she was placed supine on the OR table.  All bony prominences were padded and bilateral sequential compression devices were placed on the lower extremities.  General endotracheal anesthesia was induced without incident.  The patient was positioned appropriately for the operation and then sterilely prepped and draped in standard fashion.  A timeout was performed confirming the patient's identity, the procedure being performed, her allergies, all necessary equipment was available, and that maintenance anesthesia was adequate.  A perioperative dose of antibiotics was administered.  A one-to-one mixture of 0.25% bupivacaine and 1% lidocaine with epinephrine was infiltrated in the skin and fat over the hernia defect.  A  vertical midline incision was made overlying the palpable hernia defect.  This was carried down through the skin and subcutaneous tissues using electrocautery.  The initially identified hernia sac was dissected free from the surrounding tissues and the fascial planes exposed.  As we did this, a second defect was appreciated just to the right of the first.  Each of these was completely dissected out and the hernia sac excised.  The herniated fat was reduced into each defect.  As I palpated the fascia, a third defect was identified just cranial to the first.  This was treated similarly, the hernia sac was dissected free from the surrounding tissues and down to the fascia.  The hernia sac was excised and upon reduction of the tissue into the defect, it was noted that this was actually the falciform ligament.  Cleared the fascia both superficially and into abdominally to create a safe landing zone for the mesh.  To facilitate closure, all 3 hernia defects were joined to create one large defect.  A 4 x 6 inch piece of ventral light ST mesh was selected.  It was a bit large for the defect and so it was trimmed slightly.  It was then anchored beneath the fascia using interrupted Ethibond sutures.  The fascia was then closed with interrupted Ethibond sutures.  Liposomal bupivacaine was infiltrated along the fascial planes.  When we went to close the skin, it was noted that there was simply so much excessive skin present that a good closure with decent cosmesis would have been difficult.  There was also the risk that the skin had been devascularized and would not survive.  I therefore elected to excise the excess skin on either side of our repair.  This was done with a combination of sharp dissection and electrocautery.  Once the excess skin had been excised, I began to close the wound.  This required multiple layers to approximate the tissue and obliterate as much of the underlying space.  The wound was closed with 3 layers  of interrupted Vicryl.  Then the skin was reapproximated with running subcuticular Monocryl.  Dermabond and a sterile dressing were applied.  The patient was placed in an abdominal binder.  She was then awakened, extubated, and taken to the postanesthesia care unit in good condition.  EBL: Less than 10 cc  IVF: See anesthesia record  Specimen(s): None  Complications: none immediately apparent.    Counts: all needles, instruments, and sponges were counted and reported to be correct in number at the end of the case.   I was present for and participated in the entire operation.  Fredirick Maudlin 10:40 AM

## 2019-03-25 NOTE — Anesthesia Preprocedure Evaluation (Signed)
Anesthesia Evaluation  Patient identified by MRN, date of birth, ID band Patient awake    Reviewed: Allergy & Precautions, H&P , NPO status , Patient's Chart, lab work & pertinent test results, reviewed documented beta blocker date and time   History of Anesthesia Complications Negative for: history of anesthetic complications  Airway Mallampati: III  TM Distance: >3 FB Neck ROM: full    Dental  (+) Dental Advidsory Given, Chipped, Teeth Intact   Pulmonary neg pulmonary ROS,    Pulmonary exam normal        Cardiovascular Exercise Tolerance: Good negative cardio ROS Normal cardiovascular exam     Neuro/Psych negative neurological ROS  negative psych ROS   GI/Hepatic negative GI ROS, Neg liver ROS,   Endo/Other  negative endocrine ROS  Renal/GU negative Renal ROS  negative genitourinary   Musculoskeletal   Abdominal   Peds  Hematology negative hematology ROS (+)   Anesthesia Other Findings Past Medical History: 04/22/2017: Alpha thalassemia silent carrier     Comment:  Offer partner testing at next visit (horizon carrier               screen) No date: Gestational diabetes     Comment:  metformin No date: Preeclampsia No date: UTI (urinary tract infection)     Comment:  UTI No date: Ventral hernia without obstruction or gangrene     Comment:  reduced in the ER 2016   Reproductive/Obstetrics negative OB ROS                             Anesthesia Physical Anesthesia Plan  ASA: I  Anesthesia Plan: General   Post-op Pain Management:    Induction: Intravenous  PONV Risk Score and Plan: 3 and Ondansetron, Dexamethasone, Midazolam and Treatment may vary due to age or medical condition  Airway Management Planned: Oral ETT  Additional Equipment:   Intra-op Plan:   Post-operative Plan: Extubation in OR  Informed Consent: I have reviewed the patients History and Physical, chart,  labs and discussed the procedure including the risks, benefits and alternatives for the proposed anesthesia with the patient or authorized representative who has indicated his/her understanding and acceptance.     Dental Advisory Given  Plan Discussed with: Anesthesiologist, CRNA and Surgeon  Anesthesia Plan Comments:         Anesthesia Quick Evaluation

## 2019-03-26 NOTE — Anesthesia Postprocedure Evaluation (Signed)
Anesthesia Post Note  Patient: Traci [name]  Procedure(s) Performed: HERNIA REPAIR EPIGASTRIC ADULT WITH MESH (N/A )  Patient location during evaluation: PACU Anesthesia Type: General Level of consciousness: awake and alert Pain management: pain level controlled Vital Signs Assessment: post-procedure vital signs reviewed and stable Respiratory status: spontaneous breathing, nonlabored ventilation, respiratory function stable and patient connected to nasal cannula oxygen Cardiovascular status: blood pressure returned to baseline and stable Postop Assessment: no apparent nausea or vomiting Anesthetic complications: no     Last Vitals:  Vitals:   03/25/19 1138 03/25/19 1147  BP: 116/81 121/82  Pulse: 89 63  Resp: 16 14  Temp: (!) 36.1 C (!) 36.3 C  SpO2: 96% 100%    Last Pain:  Vitals:   03/26/19 0841  TempSrc:   PainSc: 0-No pain                 Martha Clan

## 2019-04-16 ENCOUNTER — Other Ambulatory Visit: Payer: Self-pay

## 2019-04-16 ENCOUNTER — Encounter: Payer: Self-pay | Admitting: General Surgery

## 2019-04-16 ENCOUNTER — Ambulatory Visit (INDEPENDENT_AMBULATORY_CARE_PROVIDER_SITE_OTHER): Payer: Self-pay | Admitting: General Surgery

## 2019-04-16 ENCOUNTER — Encounter: Payer: Self-pay | Admitting: Emergency Medicine

## 2019-04-16 VITALS — BP 133/85 | HR 103 | Temp 97.9°F | Resp 12 | Ht 63.5 in | Wt 158.0 lb

## 2019-04-16 DIAGNOSIS — Z09 Encounter for follow-up examination after completed treatment for conditions other than malignant neoplasm: Secondary | ICD-10-CM

## 2019-04-16 NOTE — Patient Instructions (Addendum)
Continue wearing your abdominal binder as needed. No heavy lifting more than 10 pounds until 05/06/2019.  GENERAL POST-OPERATIVE PATIENT INSTRUCTIONS   WOUND CARE INSTRUCTIONS:  Keep a dry clean dressing on the wound if there is drainage. The initial bandage may be removed after 24 hours.  Once the wound has quit draining you may leave it open to air.  If clothing rubs against the wound or causes irritation and the wound is not draining you may cover it with a dry dressing during the daytime.  Try to keep the wound dry and avoid ointments on the wound unless directed to do so.  If the wound becomes bright red and painful or starts to drain infected material that is not clear, please contact your physician immediately.  If the wound is mildly pink and has a thick firm ridge underneath it, this is normal, and is referred to as a healing ridge.  This will resolve over the next 4-6 weeks.  BATHING: You may shower if you have been informed of this by your surgeon. However, Please do not submerge in a tub, hot tub, or pool until incisions are completely sealed or have been told by your surgeon that you may do so.  DIET:  You may eat any foods that you can tolerate.  It is a good idea to eat a high fiber diet and take in plenty of fluids to prevent constipation.  If you do become constipated you may want to take a mild laxative or take ducolax tablets on a daily basis until your bowel habits are regular.  Constipation can be very uncomfortable, along with straining, after recent surgery.  ACTIVITY:  You are encouraged to cough and deep breath or use your incentive spirometer if you were given one, every 15-30 minutes when awake.  This will help prevent respiratory complications and low grade fevers post-operatively if you had a general anesthetic.  You may want to hug a pillow when coughing and sneezing to add additional support to the surgical area, if you had abdominal or chest surgery, which will decrease  pain during these times.  You are encouraged to walk and engage in light activity for the next two weeks.  You should not lift more than 10 pounds 6 weeks after your surgery as it could put you at increased risk for complications.  Twenty pounds is roughly equivalent to a plastic bag of groceries. At that time- Listen to your body when lifting, if you have pain when lifting, stop and then try again in a few days. Soreness after doing exercises or activities of daily living is normal as you get back in to your normal routine.  MEDICATIONS:  Try to take narcotic medications and anti-inflammatory medications, such as tylenol, ibuprofen, naprosyn, etc., with food.  This will minimize stomach upset from the medication.  Should you develop nausea and vomiting from the pain medication, or develop a rash, please discontinue the medication and contact your physician.  You should not drive, make important decisions, or operate machinery when taking narcotic pain medication.  SUNBLOCK Use sun block to incision area over the next year if this area will be exposed to sun. This helps decrease scarring and will allow you avoid a permanent darkened area over your incision.  QUESTIONS:  Please feel free to call our office if you have any questions, and we will be glad to assist you.

## 2019-04-16 NOTE — Progress Notes (Signed)
Shift [name] is here today for a postoperative visit.  She is a 29 year old woman who underwent open repair of an epigastric hernia on March 25, 2019.  She actually had multiple small defects in her fascia.  These were connected to create a single larger defect.  Mesh was used as part of her repair.  She states that she has done well since the operation.  She had minimal pain after the operation and the discomfort that brought her to surgical care has completely resolved.  She denies any fevers or chills.  No nausea or vomiting.  She is tolerating a regular diet and having normal bowel movements.  Today's Vitals   04/16/19 1346  BP: 133/85  Pulse: (!) 103  Resp: 12  Temp: 97.9 F (36.6 C)  TempSrc: Temporal  SpO2: 97%  Weight: 158 lb (71.7 kg)  Height: 5' 3.5" (1.613 m)  PainSc: 0-No pain   Body mass index is 27.55 kg/m. Focused abdominal exam: The vertical midline incision is healing nicely.  There is a small amount of surgical glue still present.  There is no erythema, induration, or drainage present.  There is a small dogear in the skin at the most cranial portion; the patient has significant excess skin secondary to multiple pregnancies.  With Valsalva, there is no evidence of hernia recurrence.  Impression and plan: This is a 29 year old woman who underwent open mesh repair of what proved to be multiple small epigastric hernias.  She is doing well from her operation.  She may continue to use the abdominal binder as needed.  She may return to work, but should continue to refrain from lifting anything heavier than 10 pounds until January 27, a total of 6 weeks from the time of her initial operation.  We will see her on an as-needed basis.

## 2019-05-01 ENCOUNTER — Telehealth: Payer: Self-pay

## 2019-05-01 NOTE — Telephone Encounter (Signed)
Patient had epigastric hernia repair 03/24/2020 Dr.Cannon- she is asking if she can resume taking a bath- she also  states that she has a string poking from the end of the incision.  Thinking this may be a small stitch that has poked through and in need of trimming. Denies having any open wounds or drainage. She is almost 6 weeks out from her surgery. Scheduled an appointment with [facility] 05/05/2019.

## 2019-05-05 ENCOUNTER — Encounter: Payer: Self-pay | Admitting: General Surgery

## 2019-07-06 ENCOUNTER — Encounter: Payer: Self-pay | Admitting: *Deleted

## 2019-12-14 IMAGING — US US MFM OB LIMITED
1 series · 15 of 27 positions shown · non-contrast
Comparison: none

[Series 1: us mfm ob limited · 27 acquisitions, 15 frames shown]
[im 1/27]
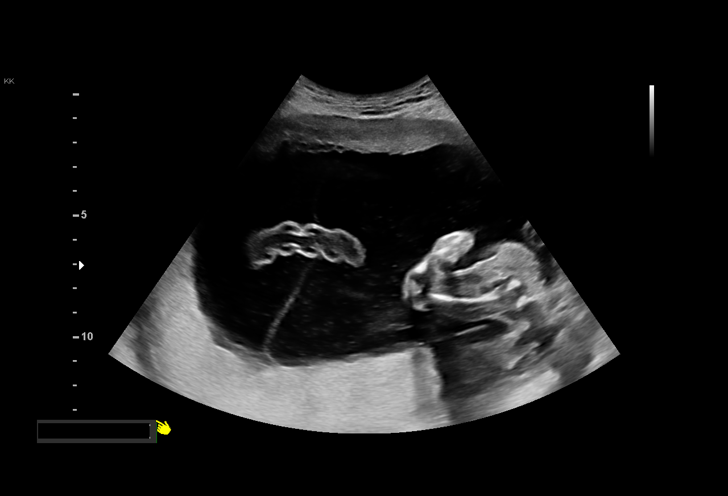
[im 3/27]
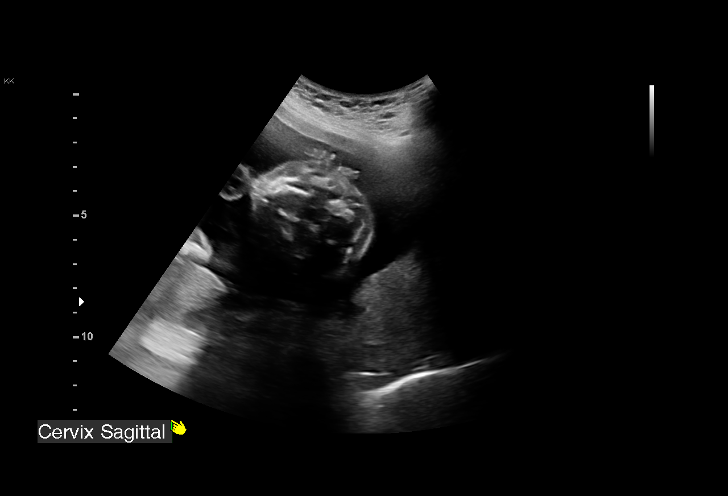
[im 5/27]
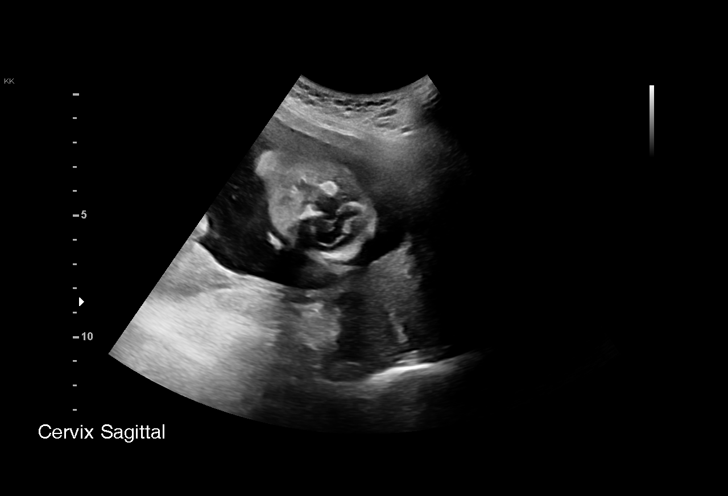
[im 7/27]
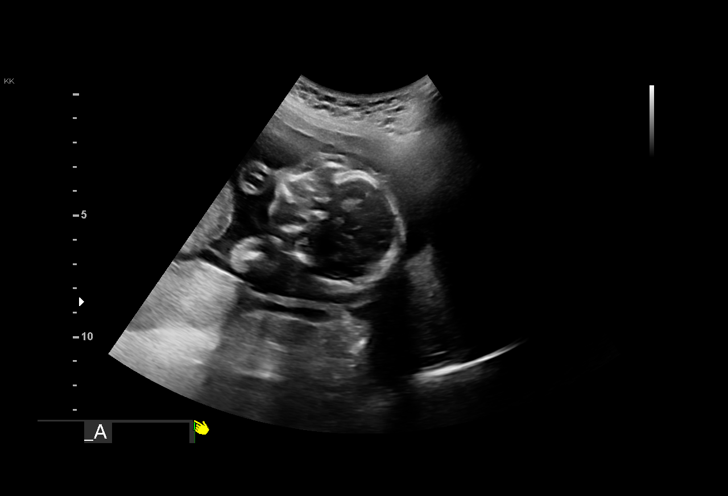
[im 9/27]
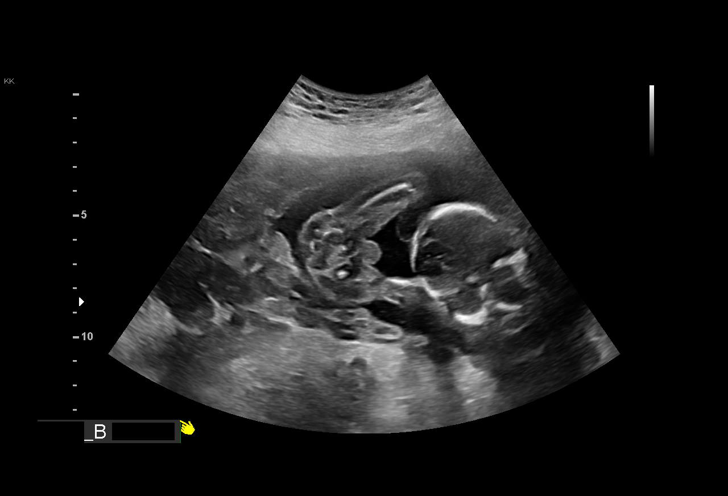
[im 10/27]
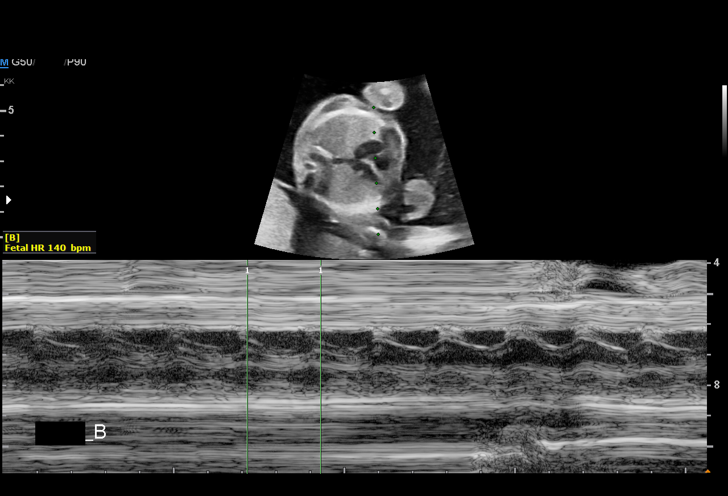
[im 12/27]
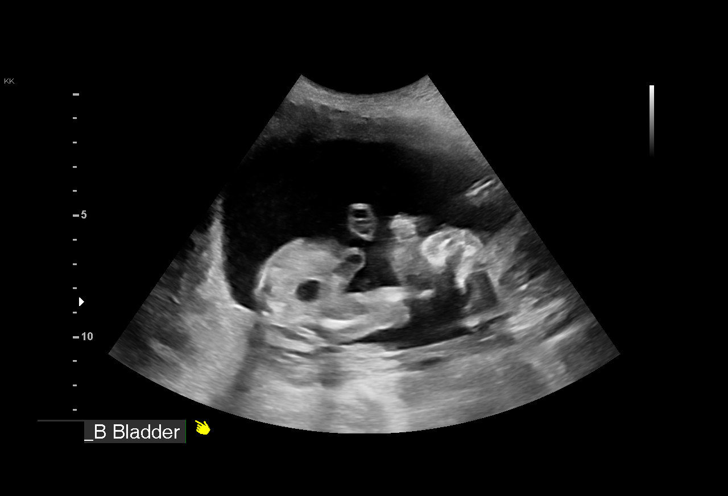
[im 14/27]
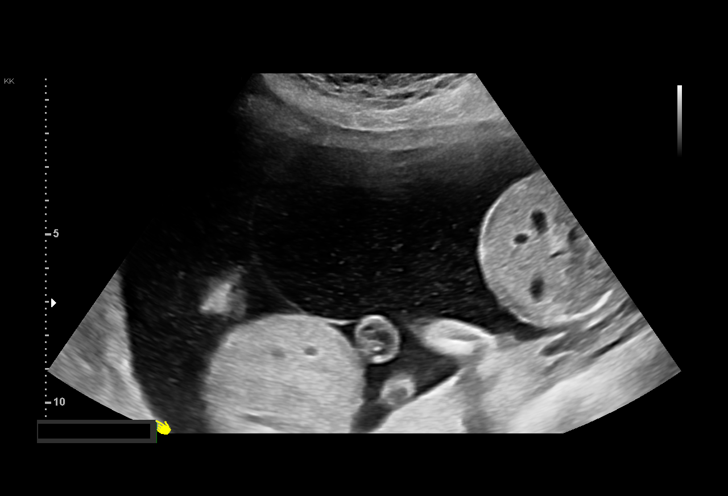
[im 16/27]
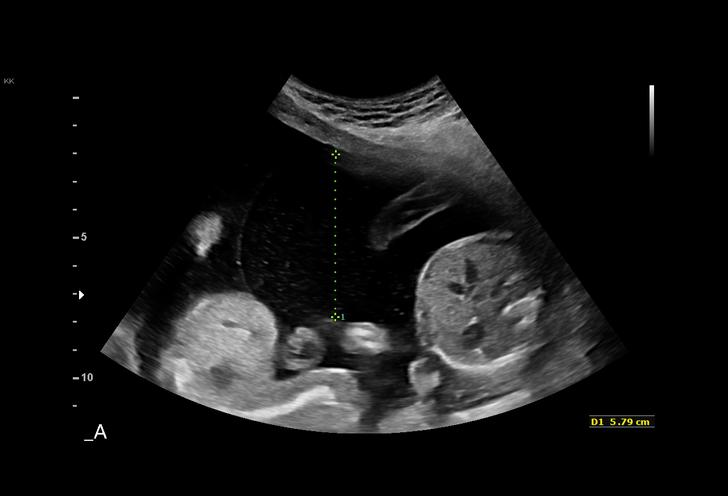
[im 18/27]
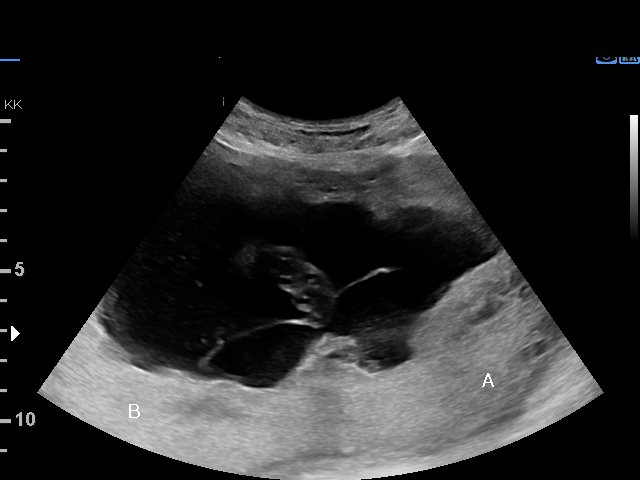
[im 19/27]
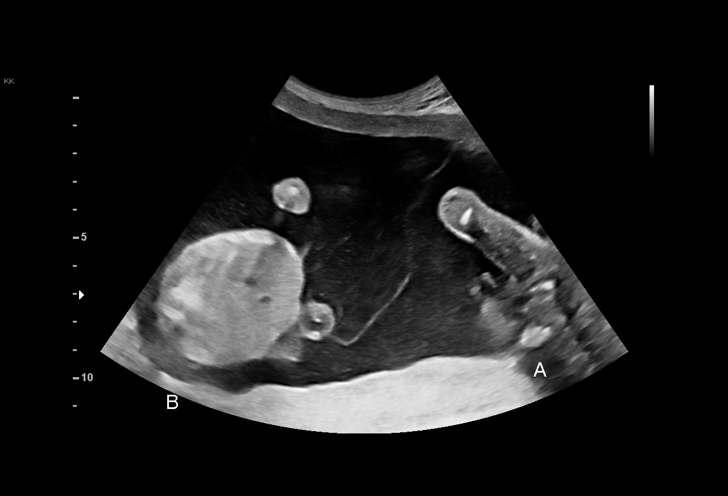
[im 21/27]
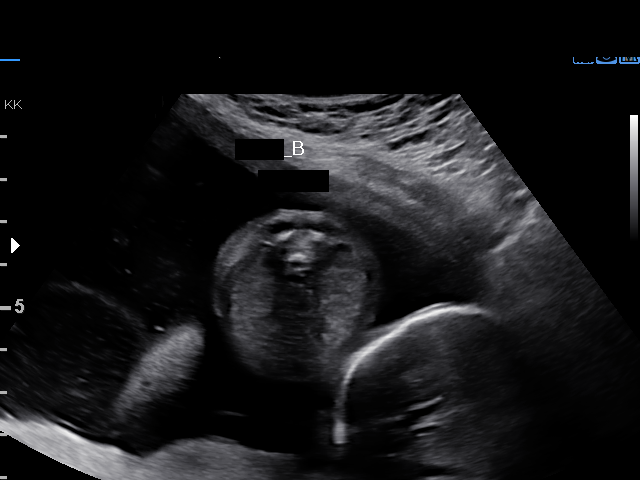
[im 23/27]
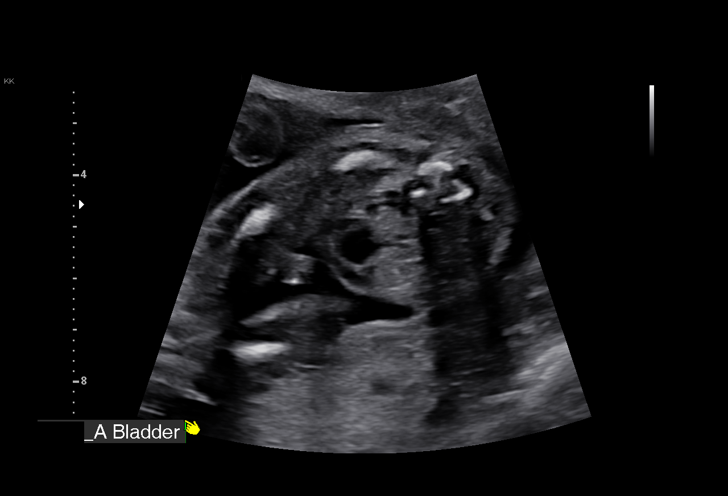
[im 25/27]
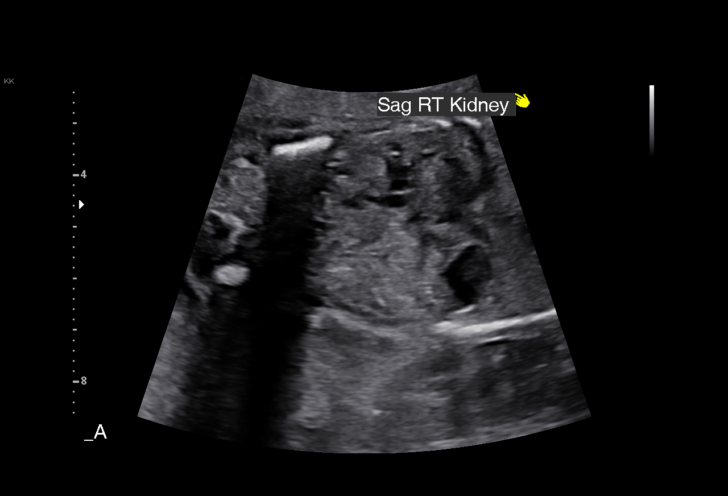
[im 27/27]
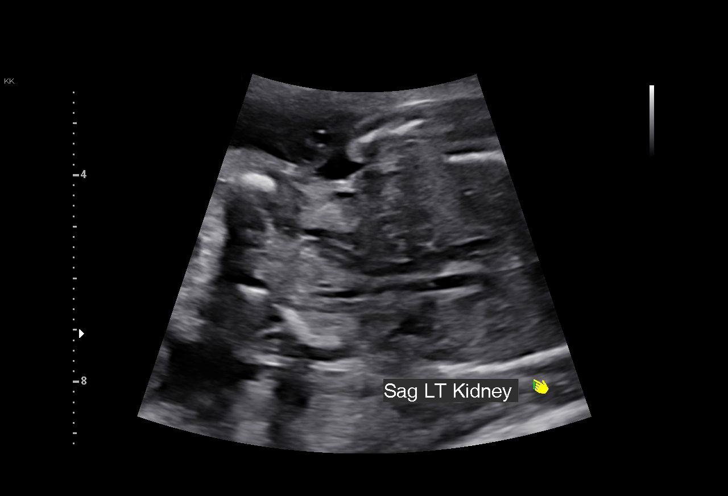

[15 of 27 positions shown; findings below may reference images not displayed]

OB/Gyn Clinic
[facility] [HOSP[facility]]

1  RIETEKE NONKES            [id number]      [id number]     [id number]
Indications

19 weeks gestation of pregnancy
Twin pregnancy, Batist/Josue Luis, second trimester
Poor obstetric history: Previous
preeclampsia / eclampsia/gestational HTN
Poor obstetric history: Previous gestational
diabetes
OB History

Gravidity:    4         Term:   3
Living:       3
Fetal Evaluation (Fetus A)

Num Of Fetuses:     2
Fetal Heart         148
Rate(bpm):
Cardiac Activity:   Observed
Fetal Lie:          Maternal left side
Presentation:       Cephalic
Placenta:           Posterior, above cervical os
Membrane Desc:      Dividing Membrane seen
Amniotic Fluid
AFI FV:      Subjectively within normal limits

Largest Pocket(cm)
5.8
Gestational Age (Fetus A)

LMP:           19w 6d        Date:  12/29/16                 EDD:   10/05/17
Best:          19w 6d     Det. By:  LMP  (12/29/16)          EDD:   10/05/17
Anatomy (Fetus A)

Stomach:               Appears normal, left   Bladder:                Appears normal
sided

Fetal Evaluation (Fetus B)

Num Of Fetuses:     2
Fetal Heart         140
Rate(bpm):
Cardiac Activity:   Observed
Fetal Lie:          Maternal right side
Presentation:       Breech
Placenta:           Posterior, above cervical os
Membrane Desc:      Dividing Membrane seen

Amniotic Fluid
AFI FV:      Subjectively within normal limits

Largest Pocket(cm)
4.9
Gestational Age (Fetus B)

LMP:           19w 6d        Date:  12/29/16                 EDD:   10/05/17
Best:          19w 6d     Det. By:  LMP  (12/29/16)          EDD:   10/05/17
Anatomy (Fetus B)

Stomach:               Appears normal, left   Bladder:                Appears normal
sided
Cervix Uterus Adnexa

Cervix
Length:            3.5  cm.
Normal appearance by transabdominal scan.
Impression

Monochorionic-diamniotic twin gestation at 19w 6d.
Cephalic / Breech presentation
MVP A: 5.8, B: 4.9.
Stomach and bladder seen x 2.
No evidence of TTTS.

Prior noted renal cyst noted.
Recommendations

TTTS check every 2 weeks
Follow-up ultrasound for growth serially

## 2020-02-08 IMAGING — US US MFM OB LIMITED
1 series · 15 of 28 positions shown · non-contrast
Comparison: none

[Series 1: us mfm ob limited · 45 acquisitions, 15 frames shown]
[im 1/45]
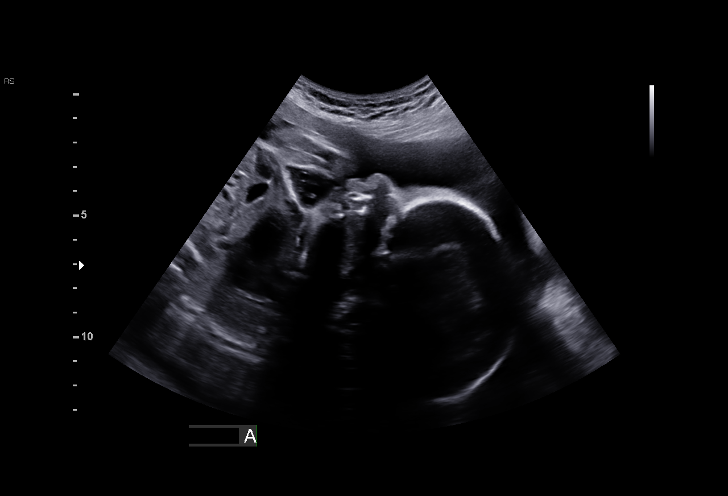
[im 4/45]
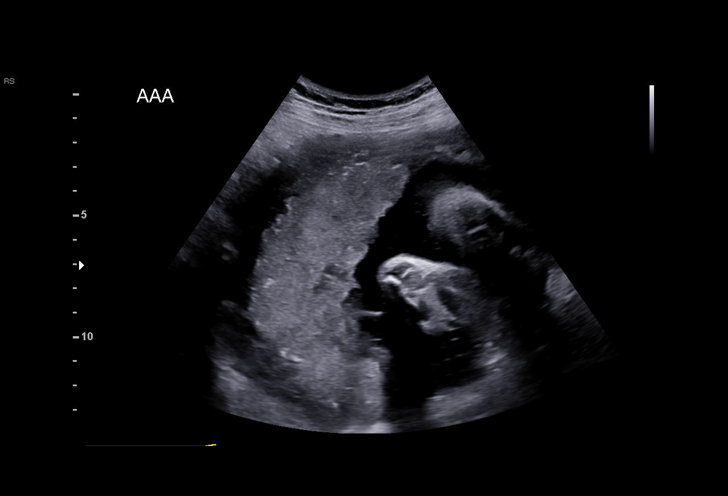
[im 7/45]
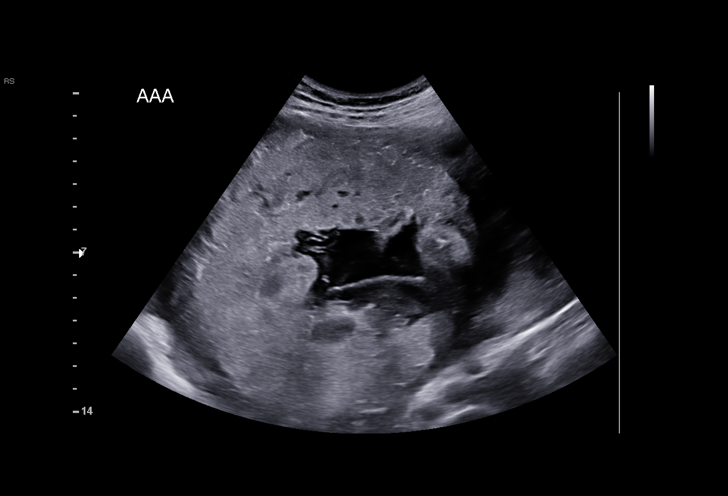
[im 10/45]
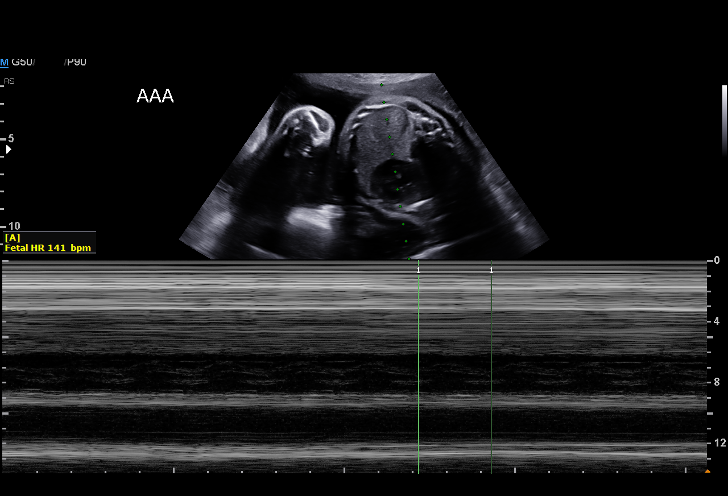
[im 14/45]
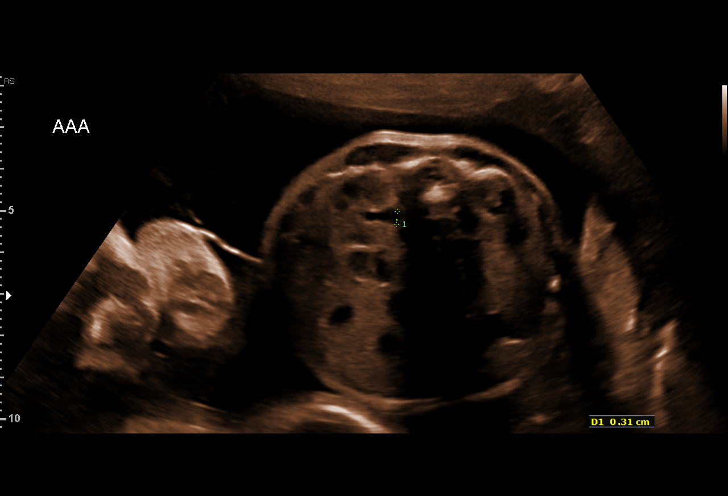
[im 17/45]
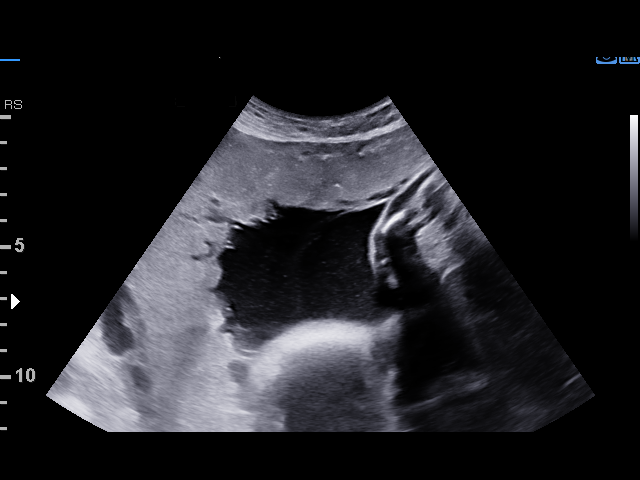
[im 20/45]
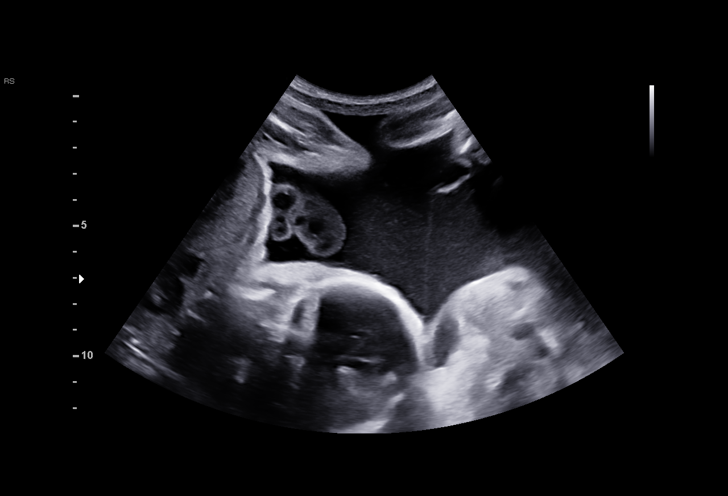
[im 23/45]
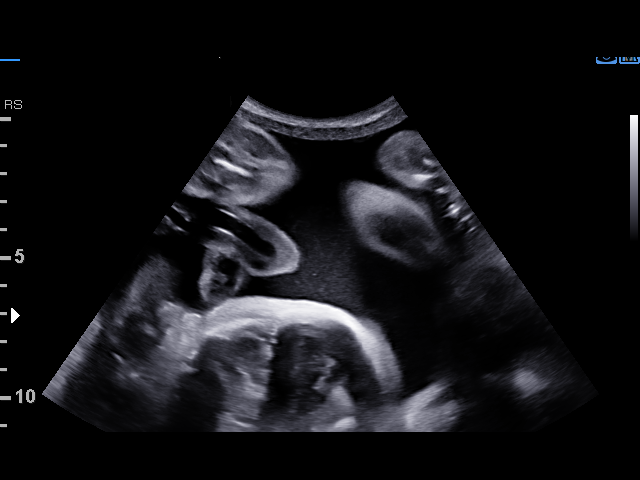
[im 25/45]
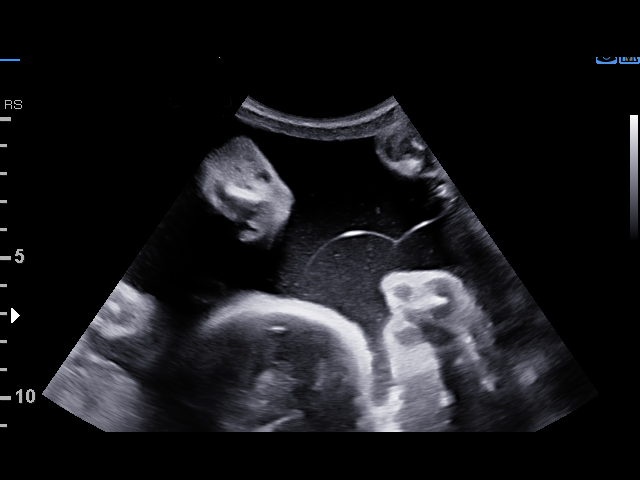
[im 28/45]
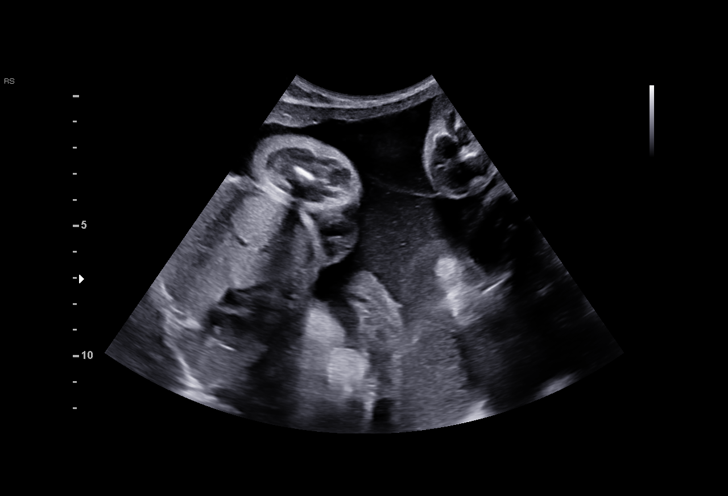
[im 31/45]
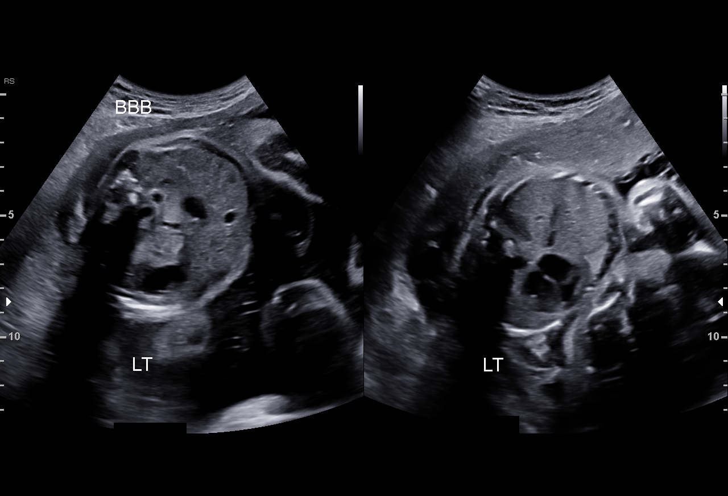
[im 35/45]
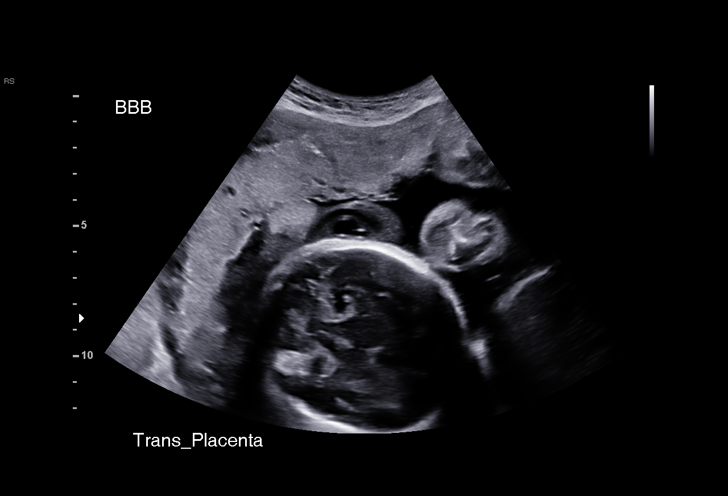
[im 38/45]
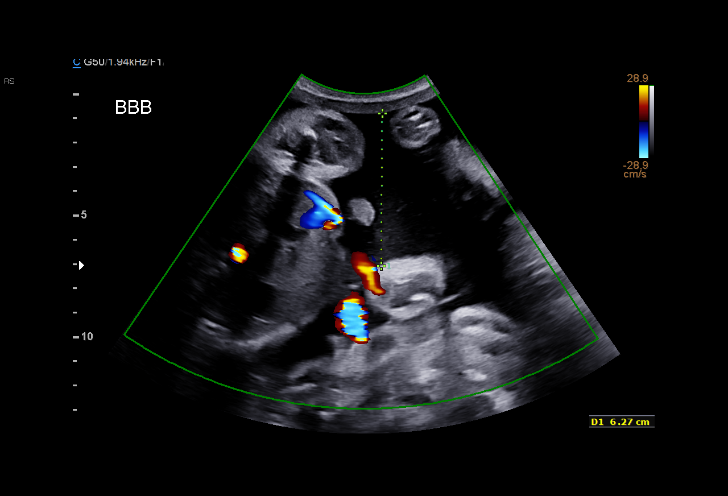
[im 41/45]
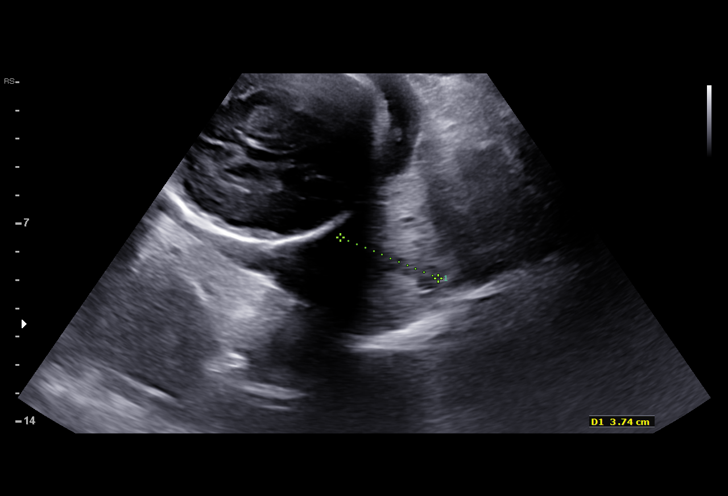
[im 45/45]
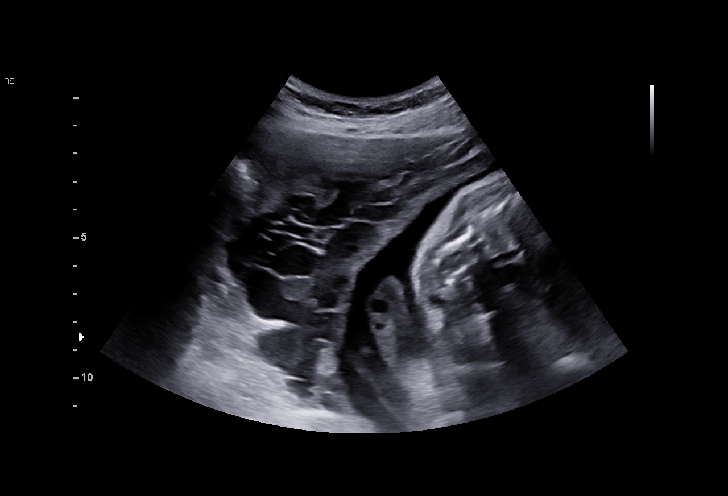

[15 of 28 positions shown; findings below may reference images not displayed]

OB/Gyn Clinic
[facility] [HOSP[facility]]

1  LABELLE SALHA BRACK TIGER            [id number]      [id number]     [id number]
Indications

27 weeks gestation of pregnancy
Twin pregnancy, Erxleben/Lamy, second trimester;
S/P eval in [place]
Jen obstetric history: Previous
preeclampsia/GDM
OB History

Gravidity:    4         Term:   3
Living:       3
Fetal Evaluation (Fetus A)

Num Of Fetuses:     2
Fetal Heart         141
Rate(bpm):
Cardiac Activity:   Observed
Fetal Lie:          Maternal left side
Presentation:       Cephalic
Placenta:           Posterior Fundal, above cervical os
P. Cord Insertion:  Previously Visualized
Membrane Desc:      Dividing Membrane seen - Monochorionic
Amniotic Fluid
AFI FV:      Subjectively within normal limits

Largest Pocket(cm)
5.00
Gestational Age (Fetus A)

LMP:           27w 6d        Date:  12/29/16                 EDD:   10/05/17
Best:          27w 6d     Det. By:  LMP  (12/29/16)          EDD:   10/05/17
Anatomy (Fetus A)

Stomach:               Appears normal, left   Bladder:                Appears normal
sided
Kidneys:               Appear normal

Fetal Evaluation (Fetus B)

Num Of Fetuses:     2
Fetal Heart         151
Rate(bpm):
Cardiac Activity:   Observed
Fetal Lie:          Maternal right side
Presentation:       Cephalic
Placenta:           Anterior, above cervical os
P. Cord Insertion:  Previously Visualized
Membrane Desc:      Dividing Membrane seen - Monochorionic

Amniotic Fluid
AFI FV:      Subjectively within normal limits

Largest Pocket(cm)
6.27
Gestational Age (Fetus B)

LMP:           27w 6d        Date:  12/29/16                 EDD:   10/05/17
Best:          27w 6d     Det. By:  LMP  (12/29/16)          EDD:   10/05/17
Anatomy (Fetus B)

Stomach:               Appears normal, left   Bladder:                Appears normal
sided
Kidneys:               Appear normal
Cervix Uterus Adnexa

Cervix
Length:           3.74  cm.
Normal appearance by transabdominal scan.

Uterus
No abnormality visualized.

Left Ovary
Within normal limits.
Right Ovary
Not visualized.

Adnexa:       No abnormality visualized. No adnexal mass
visualized.
Impression

Monochorionic-diamniotic twin gestation at 27+6 weeks
Cephalic/cephalic presentation
MVP A: 5.0 cm, B: 6.3 cm.
Stomach and bladder seen x 2.
No evidence of TTTS.

Recommendations

Continue surveillance for TTTS every 2 weeks

## 2020-02-22 IMAGING — US US MFM OB FOLLOW-UP EACH ADDL GEST (MODIFY)
1 series · 12 of 28 positions shown · non-contrast
Comparison: none

[Series 1: us mfm ob follow-up each addl gest (modify) · 12 of 44 slices shown]
[im 2/44]
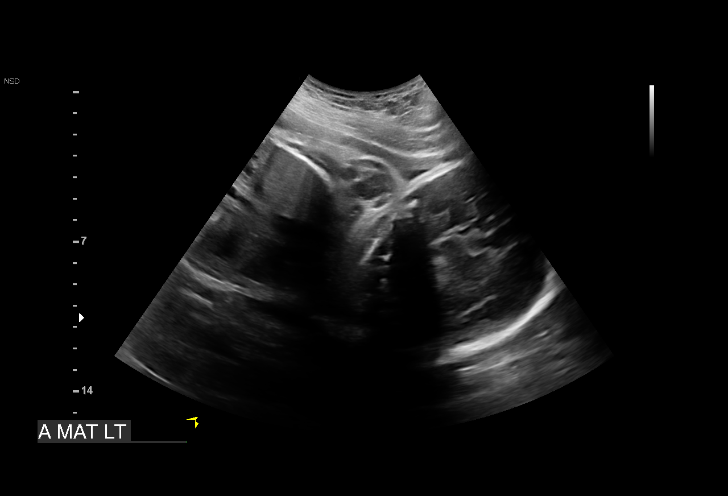
[im 5/44]
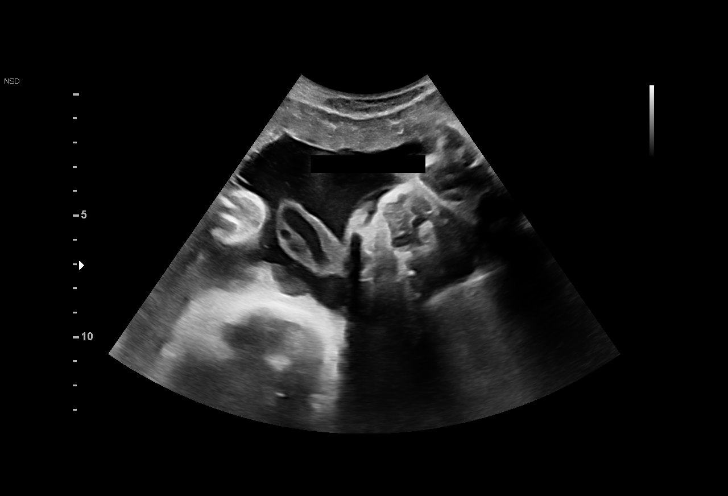
[im 8/44]
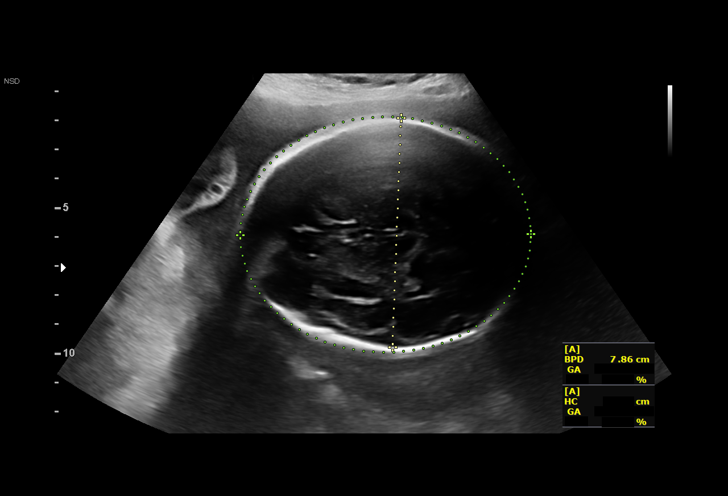
[im 13/44]
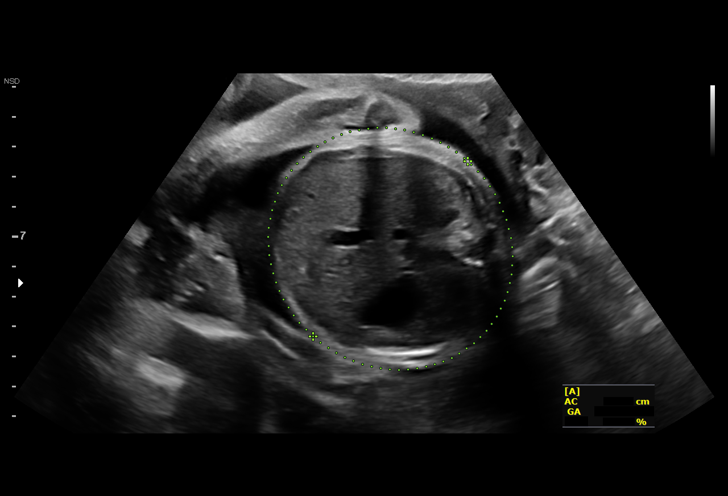
[im 16/44]
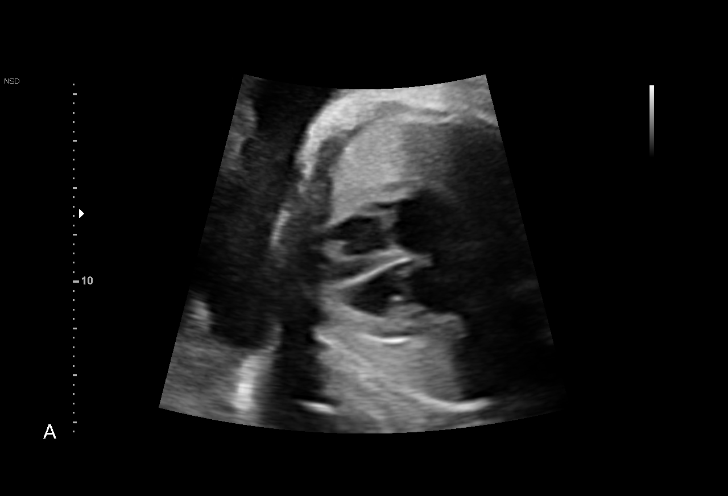
[im 20/44]
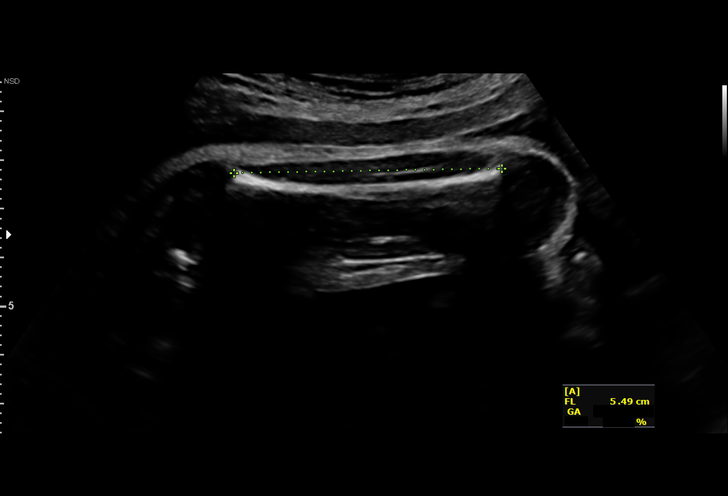
[im 24/44]
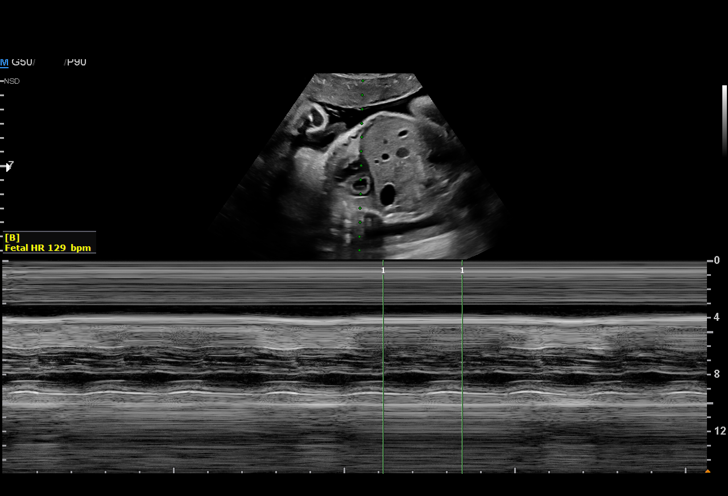
[im 28/44]
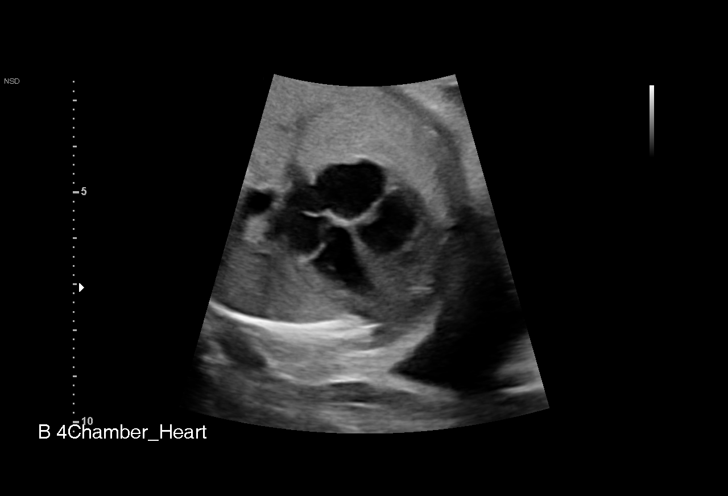
[im 31/44]
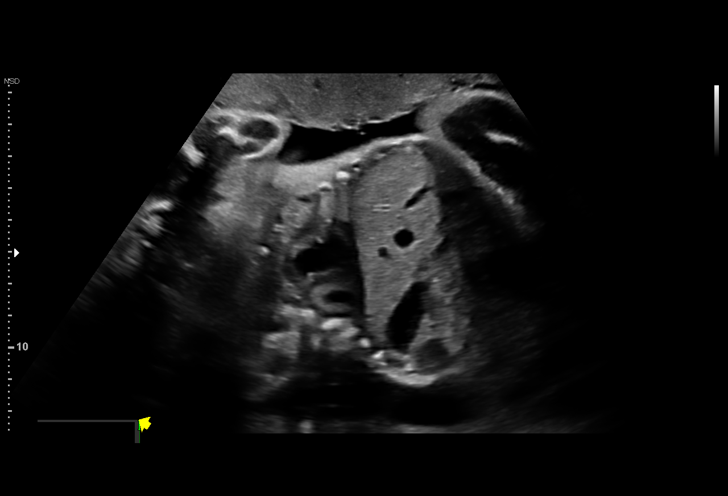
[im 36/44]
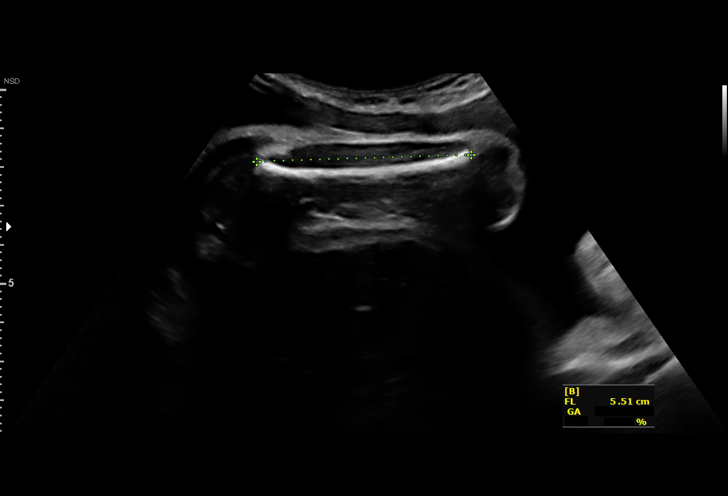
[im 39/44]
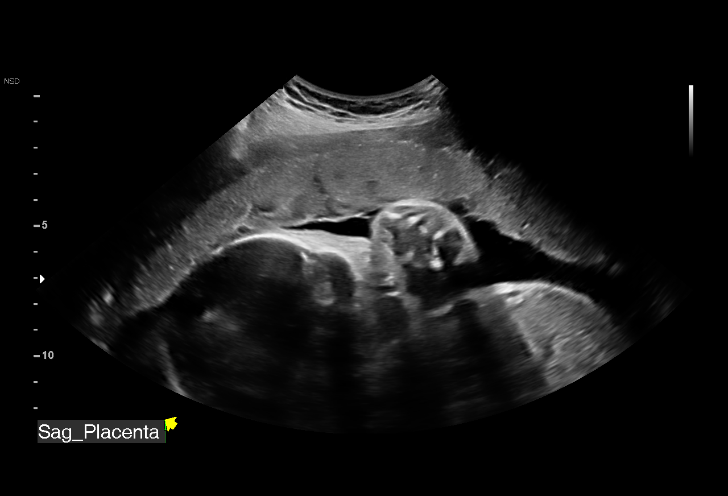
[im 42/44]
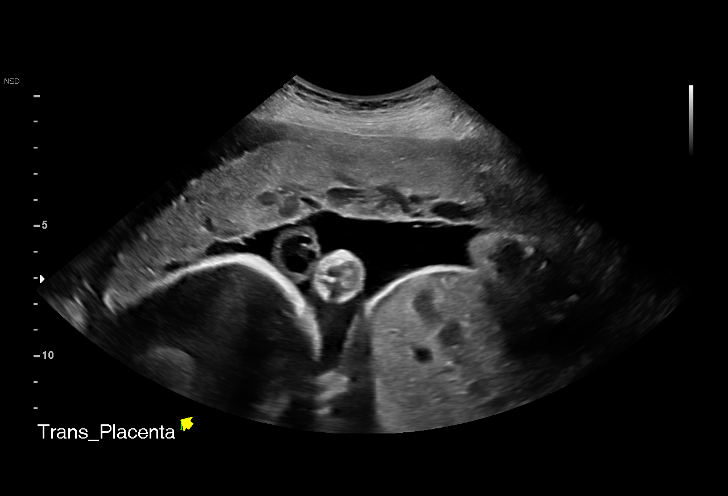

[12 of 28 positions shown; findings below may reference images not displayed]

OB/Gyn Clinic
[facility] [[facility]OS[facility]]

1  OLAF AWE            [id number]      [id number]     [id number]
2  OLAF AWE            [id number]      [id number]     [id number]
Indications

29 weeks gestation of pregnancy
Twin pregnancy, [place]/Hooten, second trimester;
S/P eval in [place] obstetric history: Previous
preeclampsia/GDM
OB History

Gravidity:    4         Term:   3
Living:       3
Fetal Evaluation (Fetus A)

Num Of Fetuses:     2
Fetal Heart         153
Rate(bpm):
Cardiac Activity:   Observed
Fetal Lie:          Maternal left side
Presentation:       Cephalic
Placenta:           Anterior Fundal, above cervical os
P. Cord Insertion:  Previously seen as normal
Membrane Desc:      Dividing Membrane seen - Monochorionic
Amniotic Fluid
AFI FV:      Subjectively within normal limits

Largest Pocket(cm)
7.8
Biometry (Fetus A)

BPD:      78.2  mm     G. Age:  31w 3d         83  %    CI:        76.43   %    70 - 86
FL/HC:      19.5   %    19.2 -
HC:      283.4  mm     G. Age:  31w 1d         51  %    HC/AC:      1.12        0.99 -
AC:      253.3  mm     G. Age:  29w 4d         35  %    FL/BPD:     70.6   %    71 - 87
FL:       55.2  mm     G. Age:  29w 1d         18  %    FL/AC:      21.8   %    20 - 24
Est. FW:    0848  gm      3 lb 3 oz     49  %     FW Discordancy         1  %
Gestational Age (Fetus A)

LMP:           29w 6d        Date:  12/29/16                 EDD:   10/05/17
U/S Today:     30w 2d                                        EDD:   10/02/17
Best:          29w 6d     Det. By:  LMP  (12/29/16)          EDD:   10/05/17
Anatomy (Fetus A)

Cranium:               Appears normal         Aortic Arch:            Previously seen
Cavum:                 Previously seen        Ductal Arch:            Previously seen
Ventricles:            Appears normal         Diaphragm:              Appears normal
Choroid Plexus:        Previously seen        Stomach:                Appears normal, left
sided
Cerebellum:            Previously seen        Abdomen:                Previously seen
Posterior Fossa:       Previously seen        Abdominal Wall:         Previously seen
Nuchal Fold:           Previously seen        Cord Vessels:           Previously seen
Face:                  Orbits and profile     Kidneys:                Appear normal
previously seen
Lips:                  Previously seen        Bladder:                Appears normal
Thoracic:              Appears normal         Spine:                  Previously seen
Heart:                 Appears normal         Upper Extremities:      Previously seen
(4CH, axis, and situs
RVOT:                  Previously seen        Lower Extremities:      Previously seen
LVOT:                  Previously seen

Other:  Male gender previously seen. Heels visualized previously.

Fetal Evaluation (Fetus B)

Num Of Fetuses:     2
Fetal Heart         129
Rate(bpm):
Cardiac Activity:   Observed
Fetal Lie:          Maternal right side
Presentation:       Transverse, head to maternal right
Placenta:           Anterior Fundal, above cervical os
P. Cord Insertion:  Previously seen as normal
Membrane Desc:      Dividing Membrane seen - Monochorionic

Amniotic Fluid
AFI FV:      Subjectively within normal limits

Largest Pocket(cm)
6.2
Biometry (Fetus B)

BPD:        75  mm     G. Age:  30w 1d         45  %    CI:        72.66   %    70 - 86
FL/HC:      19.8   %    19.2 -
HC:      279.8  mm     G. Age:  30w 5d         38  %    HC/AC:      1.09        0.99 -
AC:      257.8  mm     G. Age:  30w 0d         47  %    FL/BPD:     73.9   %    71 - 87
FL:       55.4  mm     G. Age:  29w 1d         19  %    FL/AC:      21.5   %    20 - 24

Est. FW:    3055  gm      3 lb 3 oz     51  %     FW Discordancy      0 \ 1 %
Gestational Age (Fetus B)

LMP:           29w 6d        Date:  12/29/16                 EDD:   10/05/17
U/S Today:     30w 0d                                        EDD:   10/04/17
Best:          29w 6d     Det. By:  LMP  (12/29/16)          EDD:   10/05/17
Anatomy (Fetus B)

Cranium:               Appears normal         Aortic Arch:            Previously seen
Cavum:                 Previously seen        Ductal Arch:            Previously seen
Ventricles:            Appears normal         Diaphragm:              Appears normal
Choroid Plexus:        Previously seen        Stomach:                Appears normal, left
sided
Cerebellum:            Previously seen        Abdomen:                Previously seen
Posterior Fossa:       Previously seen        Abdominal Wall:         Previously seen
Nuchal Fold:           Previously seen        Cord Vessels:           Previously seen
Face:                  Orbits and profile     Kidneys:                Appear normal
previously seen
Lips:                  Previously seen        Bladder:                Appears normal
Thoracic:              Appears normal         Spine:                  Previously seen
Heart:                 Appears normal         Upper Extremities:      Previously seen
(4CH, axis, and situs
RVOT:                  Previously seen        Lower Extremities:      Previously seen
LVOT:                  Previously seen

Other:  Male gender previously seen. Right heel previously seen. 5th digit
previously visualized.
Cervix Uterus Adnexa

Cervix
Not visualized (advanced GA >15wks)

Left Ovary
Previously seen.

Right Ovary
Previously seen

Adnexa:       No abnormality visualized.
Impression

Monochorionic diamniotic twin intrauterine pregnancy at 29
weeks 6 days.

Twin A
Appropriate interval fetal growth (49%).
Normal amniotic fluid volume (MVP 7.8).
Normal interval fetal anatomy.
Nomally filled bladder and stomach

Twin B
Appropriate interval fetal growth (51%).
Normal amniotic fluid volume (MVP 6.2).
Normal interval fetal anatomy.
Nomally filled bladder and stomach

Concordant fluid and growth
no evidence of TTTS
Recommendations

Continue surveillance for TTTS every 2 weeks
Begin antenatal testing at 32 weeks
Delivery at 36-37 weeks provided [place] pair remains
uncomplicated.

## 2020-12-27 ENCOUNTER — Encounter: Payer: Self-pay | Admitting: General Surgery
